# Patient Record
Sex: Female | Born: 1985 | Race: White | Hispanic: No | Marital: Married | Smoking: Never smoker
Health system: Southern US, Community
[De-identification: ages and names within clinical notes are randomized; demographics above are authoritative.]

## PROBLEM LIST (undated history)

## (undated) DIAGNOSIS — C801 Malignant (primary) neoplasm, unspecified: Secondary | ICD-10-CM

## (undated) DIAGNOSIS — R112 Nausea with vomiting, unspecified: Secondary | ICD-10-CM

## (undated) DIAGNOSIS — Z9889 Other specified postprocedural states: Secondary | ICD-10-CM

## (undated) HISTORY — PX: BREAST SURGERY: SHX581

## (undated) HISTORY — PX: SALPINGECTOMY: SHX328

## (undated) HISTORY — PX: BREAST RECONSTRUCTION WITH PLACEMENT OF TISSUE EXPANDER AND ALLODERM: SHX6805

---

## 2010-02-22 HISTORY — PX: LOBECTOMY: SHX5089

## 2012-05-30 ENCOUNTER — Other Ambulatory Visit: Payer: Self-pay | Admitting: *Deleted

## 2012-05-30 DIAGNOSIS — Z853 Personal history of malignant neoplasm of breast: Secondary | ICD-10-CM

## 2012-06-05 ENCOUNTER — Other Ambulatory Visit: Payer: Self-pay

## 2014-12-17 LAB — OB RESULTS CONSOLE ABO/RH: RH Type: POSITIVE

## 2014-12-17 LAB — OB RESULTS CONSOLE RPR: RPR: NONREACTIVE

## 2014-12-17 LAB — OB RESULTS CONSOLE ANTIBODY SCREEN: ANTIBODY SCREEN: NEGATIVE

## 2014-12-17 LAB — OB RESULTS CONSOLE HEPATITIS B SURFACE ANTIGEN: Hepatitis B Surface Ag: NEGATIVE

## 2014-12-17 LAB — OB RESULTS CONSOLE RUBELLA ANTIBODY, IGM: RUBELLA: IMMUNE

## 2014-12-17 LAB — OB RESULTS CONSOLE HIV ANTIBODY (ROUTINE TESTING): HIV: NONREACTIVE

## 2014-12-17 LAB — OB RESULTS CONSOLE GC/CHLAMYDIA
CHLAMYDIA, DNA PROBE: NEGATIVE
Gonorrhea: NEGATIVE

## 2015-05-31 ENCOUNTER — Inpatient Hospital Stay (HOSPITAL_COMMUNITY)
Admission: AD | Admit: 2015-05-31 | Discharge: 2015-06-04 | DRG: 765 | Disposition: A | Discharge status: Home / Self Care | Payer: 59 | Source: Ambulatory Visit | Attending: Obstetrics & Gynecology | Admitting: Obstetrics & Gynecology

## 2015-05-31 ENCOUNTER — Encounter (HOSPITAL_COMMUNITY): Payer: Self-pay | Admitting: *Deleted

## 2015-05-31 ENCOUNTER — Inpatient Hospital Stay (HOSPITAL_COMMUNITY): Payer: 59

## 2015-05-31 DIAGNOSIS — O321XX2 Maternal care for breech presentation, fetus 2: Secondary | ICD-10-CM | POA: Diagnosis present

## 2015-05-31 DIAGNOSIS — Z3A33 33 weeks gestation of pregnancy: Secondary | ICD-10-CM | POA: Diagnosis not present

## 2015-05-31 DIAGNOSIS — O42013 Preterm premature rupture of membranes, onset of labor within 24 hours of rupture, third trimester: Secondary | ICD-10-CM | POA: Diagnosis not present

## 2015-05-31 DIAGNOSIS — D649 Anemia, unspecified: Secondary | ICD-10-CM | POA: Diagnosis not present

## 2015-05-31 DIAGNOSIS — Z9079 Acquired absence of other genital organ(s): Secondary | ICD-10-CM | POA: Diagnosis not present

## 2015-05-31 DIAGNOSIS — O30049 Twin pregnancy, dichorionic/diamniotic, unspecified trimester: Secondary | ICD-10-CM | POA: Diagnosis present

## 2015-05-31 DIAGNOSIS — O42913 Preterm premature rupture of membranes, unspecified as to length of time between rupture and onset of labor, third trimester: Secondary | ICD-10-CM | POA: Diagnosis not present

## 2015-05-31 DIAGNOSIS — Z3689 Encounter for other specified antenatal screening: Secondary | ICD-10-CM

## 2015-05-31 DIAGNOSIS — Z853 Personal history of malignant neoplasm of breast: Secondary | ICD-10-CM | POA: Diagnosis not present

## 2015-05-31 DIAGNOSIS — O9902 Anemia complicating childbirth: Secondary | ICD-10-CM | POA: Diagnosis not present

## 2015-05-31 DIAGNOSIS — Z23 Encounter for immunization: Secondary | ICD-10-CM | POA: Diagnosis not present

## 2015-05-31 DIAGNOSIS — O43123 Velamentous insertion of umbilical cord, third trimester: Secondary | ICD-10-CM | POA: Diagnosis present

## 2015-05-31 DIAGNOSIS — O30009 Twin pregnancy, unspecified number of placenta and unspecified number of amniotic sacs, unspecified trimester: Secondary | ICD-10-CM | POA: Diagnosis present

## 2015-05-31 DIAGNOSIS — O30043 Twin pregnancy, dichorionic/diamniotic, third trimester: Secondary | ICD-10-CM | POA: Diagnosis present

## 2015-05-31 DIAGNOSIS — O321XX1 Maternal care for breech presentation, fetus 1: Secondary | ICD-10-CM | POA: Diagnosis present

## 2015-05-31 HISTORY — DX: Malignant (primary) neoplasm, unspecified: C80.1

## 2015-05-31 LAB — CBC
HEMATOCRIT: 32.4 % — AB (ref 36.0–46.0)
HEMOGLOBIN: 11 g/dL — AB (ref 12.0–15.0)
MCH: 30.6 pg (ref 26.0–34.0)
MCHC: 34 g/dL (ref 30.0–36.0)
MCV: 90.3 fL (ref 78.0–100.0)
Platelets: 171 10*3/uL (ref 150–400)
RBC: 3.59 MIL/uL — ABNORMAL LOW (ref 3.87–5.11)
RDW: 13.3 % (ref 11.5–15.5)
WBC: 8.5 10*3/uL (ref 4.0–10.5)

## 2015-05-31 LAB — WET PREP, GENITAL
CLUE CELLS WET PREP: NONE SEEN
Sperm: NONE SEEN
TRICH WET PREP: NONE SEEN
Yeast Wet Prep HPF POC: NONE SEEN

## 2015-05-31 MED ORDER — BETAMETHASONE SOD PHOS & ACET 6 (3-3) MG/ML IJ SUSP
12.0000 mg | Freq: Once | INTRAMUSCULAR | Status: AC
  Administered 2015-05-31: 12 mg via INTRAMUSCULAR
  Filled 2015-05-31: qty 2

## 2015-05-31 MED ORDER — NIFEDIPINE 10 MG PO CAPS
10.0000 mg | ORAL_CAPSULE | ORAL | Status: AC | PRN
Start: 2015-05-31 — End: 2015-06-01
  Administered 2015-05-31 – 2015-06-01 (×4): 10 mg via ORAL
  Filled 2015-05-31 (×4): qty 1

## 2015-05-31 NOTE — H&P (Signed)
Joy Reyes is a 30 y.o. female, G1P0 at 46 weeks with TIUP, presenting for pPROM at 2030 on 05/31/2015.  Patient reports clear fluid and denies ctx and vb.  Patient endorses fetal movement x2 since SROM.  Pregnancy was result of in vitro fertilization as patient has a personal history of breast cancer with salpingectomy secondary to positive BRCA testing.  Breech presentation noted on 4/4 [place].  Patient Active Problem List   Diagnosis Date Noted  . Preterm premature rupture of membranes in third trimester 06/01/2015  . Dichorionic diamniotic twin pregnancy 06/01/2015  . Personal history of breast cancer 06/01/2015  . H/O bilateral salpingectomy 06/01/2015    History of present pregnancy: Patient entered care at 9.3 weeks.   EDC of 07/19/2015 was established by Definite LMP of 10/12/2015 and confirmed by 8.0 wk [place] on 12/06/2014.   Anatomy scan:  21.3 weeks, with normal finding.   Additional [place] evaluations:   Anatomy: NORMAL ANATOMY US TODAY REVIEWED, NEEDS COMPLETION IN 2 WEEKS. TWIN FETAL POSITIONING DISCUSSED 23.3wks: [place]: A BREECH, NORMAL FLUID, GROWTH 57%. VELEMENTOUS CORD. B BREECH, NORMAL FLUID, GROWTH 54%. CX 4.46 CM. 32.3wks: DI/DI twin gestation FETUS A-EFW (1785g/3lbs 15oz) Lower maternal right. Complete breech presentation. Anterior placenta. Fluid is normal-vertical pocket=4.8cm. Spine not well visualized due to supine position. Previously seen velamentous cord insertion difficult to image on today's scan. Overall growth=17.1% BPD=14.6% AC=12.4% FETUS B-(2035g/4lbs 8oz) Maternal right upper breech presentation. Posterior placenta. Fluid is normal-8.6cm. Cervix closed. Adnexas unremarkable. DA/AA/Open hands/5th digit not well visualized due to advanced gestational age & position.   Significant prenatal events: 1st Trimester: No complaints.  Declined genetic screening 2nd Trimester: Influenza not recommended due to h/o compromised immune system. Pt c/o one episode of heart racing but  states she was outside in the heat for a prolonged period of time. Pt declines GTT, Due to medical reasons (Pt had Breast Cancer 2012 was told by Oncologist not to Consume Concentrated Sugars). 3rd Trimester: ABDOMINAL BINDER USE ADVISED FOR PELVIC PRESSURE. Pt reports some mild swelling of the feet/ankles    Last evaluation:  05/27/2015 in office by Dr. AVS.  FHR: 144/155 BP 114/78 Wt 184lbs TWG: 31  OB History    Gravida Para Term Preterm AB TAB SAB Ectopic Multiple Living   1              Past Medical History  Diagnosis Date  . Cancer [facility])    Past Surgical History  Procedure Laterality Date  . Breast surgery      lumpectomy on right breast  . Salpingectomy     Family History: family history is not on file. Social History:  reports that she has never smoked. She does not have any smokeless tobacco history on file. She reports that she drinks alcohol. She reports that she does not use illicit drugs.  Patient is married to Sharman Cheek who is involved and supportive.  Patient is of the [place] and has a 4 year college degree.  She is employed as a Warehouse manager.   Prenatal Transfer Tool  Maternal Diabetes: No Genetic Screening: Declined Maternal Ultrasounds/Referrals: Normal Fetal Ultrasounds or other Referrals:  None Maternal Substance Abuse:  No Significant Maternal Medications:  None Significant Maternal Lab Results: None  ROS:  Patient reports fetal movement, LOF, and denies ctx and VB.  Patient reports recent cold, but denies GI concerns Patient denies headache, visual disturbances,   Not on File   Dilation: 1 Effacement (%): 90 Station: -1 Exam  by:Marland Kitchen J. EMlyCNM Blood pressure 122/72, pulse 85, temperature 98.1 F (36.7 C), resp. rate 18, height _0  (1.727 m), weight 87.181 kg (192 lb 3.2 oz).  Physical Exam  Constitutional: She is oriented to person, place, and time. She appears well-developed and well-nourished. No distress.  HENT:  Head: Normocephalic  and atraumatic.  Eyes: Conjunctivae are normal.  Neck: Normal range of motion.  Cardiovascular: Normal rate, regular rhythm and normal heart sounds.   Respiratory: Effort normal and breath sounds normal.  GI: Soft.  Genitourinary: Vagina normal. Uterus is enlarged. No bleeding in the vagina.  Vaginal vault: Large amt of watery fluid noted.  Clear, Nonodorous  Musculoskeletal: Normal range of motion.  Neurological: She is alert and oriented to person, place, and time.  Skin: Skin is warm and dry.    Le[place]: EFW: TA: 3lbs 15oz TB: 4lbs 8oz by [place] on 4/4 Presentation: Breech/Breech  FHR: TA: 135 bpm, Mod Var, -Decels, +Accels TB: 145 bpm, Mod Var, -Decels, +Accels  UCs:  Q1-7mn, palpates moderate  Prenatal labs: ABO, Rh: A/Positive/-- (10/25 0000) Antibody: Negative (10/25 0000) Rubella:  Immune RPR: Nonreactive (10/25 0000)  HBsAg: Negative (10/25 0000)  HIV: Non-reactive (10/25 0000)  GBS:   Pending Sickle cell/Hgb electrophoresis:  Normal Pap:  2012 GC:  Negative Chlamydia:  Negative Other:  Hgb A1C: 5.4    Assessment TIUP at 33.0wks Cat I FT pPROM Undeterminable Fetal Presenting Part Velamentous Cord Insertion  Plan: Admit to L&D until stabilization confirmed per consult with Dr. AGillermo MurdochRoutine Antepartum Orders per [facility] Guideline Latency Antibiotics BMZ Course MFM Consult Collect GBS  [place] Fetal Presentation Procardia Protocol POC discussed with patient Q/C addressed Other mgmt as ordered  JLoann Quill MSN 05/31/2015, 11:12 PM   Addendum: [place]veals TIUP TA: Breech Maternal Right TB: Breech Maternal Left  GBS Negative  JMaryann ConnersMSN, CNM 06/01/2015 1:35 AM

## 2015-05-31 NOTE — MAU Note (Addendum)
Leaking clear fld since 2045. No pain. Twin boys - u/s this wk both breech

## 2015-06-01 ENCOUNTER — Encounter (HOSPITAL_COMMUNITY): Payer: Self-pay | Admitting: *Deleted

## 2015-06-01 ENCOUNTER — Inpatient Hospital Stay (HOSPITAL_COMMUNITY): Payer: 59 | Admitting: Anesthesiology

## 2015-06-01 ENCOUNTER — Encounter (HOSPITAL_COMMUNITY)
Admission: AD | Disposition: A | Discharge status: Home / Self Care | Payer: Self-pay | Source: Ambulatory Visit | Attending: Obstetrics & Gynecology

## 2015-06-01 DIAGNOSIS — Z9079 Acquired absence of other genital organ(s): Secondary | ICD-10-CM

## 2015-06-01 DIAGNOSIS — Z853 Personal history of malignant neoplasm of breast: Secondary | ICD-10-CM

## 2015-06-01 DIAGNOSIS — O30049 Twin pregnancy, dichorionic/diamniotic, unspecified trimester: Secondary | ICD-10-CM | POA: Diagnosis present

## 2015-06-01 DIAGNOSIS — O42913 Preterm premature rupture of membranes, unspecified as to length of time between rupture and onset of labor, third trimester: Secondary | ICD-10-CM | POA: Diagnosis present

## 2015-06-01 LAB — TYPE AND SCREEN
ABO/RH(D): A POS
ANTIBODY SCREEN: NEGATIVE

## 2015-06-01 LAB — HIV ANTIBODY (ROUTINE TESTING W REFLEX): HIV Screen 4th Generation wRfx: NONREACTIVE

## 2015-06-01 LAB — GROUP B STREP BY PCR: GROUP B STREP BY PCR: NEGATIVE

## 2015-06-01 LAB — RPR: RPR Ser Ql: NONREACTIVE

## 2015-06-01 LAB — ABO/RH: ABO/RH(D): A POS

## 2015-06-01 SURGERY — Surgical Case
Anesthesia: Regional

## 2015-06-01 MED ORDER — SCOPOLAMINE 1 MG/3DAYS TD PT72
MEDICATED_PATCH | TRANSDERMAL | Status: AC
  Administered 2015-06-01: 1.5 mg
  Filled 2015-06-01: qty 1

## 2015-06-01 MED ORDER — SODIUM CHLORIDE 0.9% FLUSH: 3.0000 mL | INTRAVENOUS | Status: DC | PRN

## 2015-06-01 MED ORDER — FENTANYL CITRATE (PF) 100 MCG/2ML IJ SOLN
INTRAMUSCULAR | Status: AC
  Filled 2015-06-01: qty 2

## 2015-06-01 MED ORDER — CEFAZOLIN SODIUM-DEXTROSE 2-4 GM/100ML-% IV SOLN: 2.0000 g | Freq: Once | INTRAVENOUS | Status: DC

## 2015-06-01 MED ORDER — MAGNESIUM SULFATE BOLUS VIA INFUSION
4.0000 g | Freq: Once | INTRAVENOUS | Status: AC
  Administered 2015-06-01: 4 g via INTRAVENOUS
  Filled 2015-06-01: qty 500

## 2015-06-01 MED ORDER — HYDROMORPHONE HCL 1 MG/ML IJ SOLN: 0.2500 mg | INTRAMUSCULAR | Status: DC | PRN

## 2015-06-01 MED ORDER — NALOXONE HCL 0.4 MG/ML IJ SOLN: 0.4000 mg | INTRAMUSCULAR | Status: DC | PRN

## 2015-06-01 MED ORDER — DIBUCAINE 1 % RE OINT: 1.0000 "application " | TOPICAL_OINTMENT | RECTAL | Status: DC | PRN

## 2015-06-01 MED ORDER — LACTATED RINGERS IV SOLN
INTRAVENOUS | Status: DC | PRN
  Administered 2015-06-01 (×2): via INTRAVENOUS

## 2015-06-01 MED ORDER — ZOLPIDEM TARTRATE 5 MG PO TABS: 5.0000 mg | ORAL_TABLET | Freq: Every evening | ORAL | Status: DC | PRN

## 2015-06-01 MED ORDER — PRENATAL MULTIVITAMIN CH: 1.0000 | ORAL_TABLET | Freq: Every day | ORAL | Status: DC

## 2015-06-01 MED ORDER — DIPHENHYDRAMINE HCL 50 MG/ML IJ SOLN: 12.5000 mg | INTRAMUSCULAR | Status: DC | PRN

## 2015-06-01 MED ORDER — OXYTOCIN 10 UNIT/ML IJ SOLN: 2.5000 [IU]/h | INTRAVENOUS | Status: AC

## 2015-06-01 MED ORDER — DIPHENHYDRAMINE HCL 25 MG PO CAPS: 25.0000 mg | ORAL_CAPSULE | ORAL | Status: DC | PRN

## 2015-06-01 MED ORDER — NALBUPHINE HCL 10 MG/ML IJ SOLN: 5.0000 mg | INTRAMUSCULAR | Status: DC | PRN

## 2015-06-01 MED ORDER — ONDANSETRON HCL 4 MG/2ML IJ SOLN
INTRAMUSCULAR | Status: AC
  Filled 2015-06-01: qty 2

## 2015-06-01 MED ORDER — CITRIC ACID-SODIUM CITRATE 334-500 MG/5ML PO SOLN
ORAL | Status: AC
  Administered 2015-06-01: 30 mL
  Filled 2015-06-01: qty 15

## 2015-06-01 MED ORDER — MEPERIDINE HCL 25 MG/ML IJ SOLN: 6.2500 mg | INTRAMUSCULAR | Status: DC | PRN

## 2015-06-01 MED ORDER — MORPHINE SULFATE (PF) 0.5 MG/ML IJ SOLN
INTRAMUSCULAR | Status: DC | PRN
  Administered 2015-06-01: .2 mg via EPIDURAL

## 2015-06-01 MED ORDER — CALCIUM CARBONATE ANTACID 500 MG PO CHEW: 2.0000 | CHEWABLE_TABLET | ORAL | Status: DC | PRN

## 2015-06-01 MED ORDER — OXYTOCIN 10 UNIT/ML IJ SOLN
40.0000 [IU] | INTRAVENOUS | Status: DC | PRN
  Administered 2015-06-01: 40 [IU] via INTRAVENOUS

## 2015-06-01 MED ORDER — IBUPROFEN 600 MG PO TABS
600.0000 mg | ORAL_TABLET | Freq: Four times a day (QID) | ORAL | Status: DC
  Administered 2015-06-02 – 2015-06-04 (×9): 600 mg via ORAL
  Filled 2015-06-01 (×11): qty 1

## 2015-06-01 MED ORDER — KETOROLAC TROMETHAMINE 30 MG/ML IJ SOLN
30.0000 mg | Freq: Four times a day (QID) | INTRAMUSCULAR | Status: AC | PRN
  Administered 2015-06-01: 30 mg via INTRAMUSCULAR

## 2015-06-01 MED ORDER — LACTATED RINGERS IV SOLN
INTRAVENOUS | Status: DC | PRN
  Administered 2015-06-01: 10:00:00 via INTRAVENOUS

## 2015-06-01 MED ORDER — OXYCODONE-ACETAMINOPHEN 5-325 MG PO TABS: 2.0000 | ORAL_TABLET | ORAL | Status: DC | PRN

## 2015-06-01 MED ORDER — ONDANSETRON HCL 4 MG/2ML IJ SOLN
INTRAMUSCULAR | Status: AC
  Administered 2015-06-01: 4 mg
  Filled 2015-06-01: qty 2

## 2015-06-01 MED ORDER — BUPIVACAINE HCL (PF) 0.5 % IJ SOLN
INTRAMUSCULAR | Status: AC
  Filled 2015-06-01: qty 30

## 2015-06-01 MED ORDER — BUPIVACAINE HCL (PF) 0.5 % IJ SOLN
INTRAMUSCULAR | Status: DC | PRN
  Administered 2015-06-01: 3 mL via INTRATHECAL

## 2015-06-01 MED ORDER — OXYTOCIN 10 UNIT/ML IJ SOLN
INTRAMUSCULAR | Status: AC
  Filled 2015-06-01: qty 4

## 2015-06-01 MED ORDER — AZITHROMYCIN 500 MG PO TABS
1000.0000 mg | ORAL_TABLET | Freq: Once | ORAL | Status: AC
  Administered 2015-06-01: 1000 mg via ORAL
  Filled 2015-06-01: qty 2

## 2015-06-01 MED ORDER — PROMETHAZINE HCL 25 MG/ML IJ SOLN
INTRAMUSCULAR | Status: AC
  Filled 2015-06-01: qty 1

## 2015-06-01 MED ORDER — MENTHOL 3 MG MT LOZG: 1.0000 | LOZENGE | OROMUCOSAL | Status: DC | PRN

## 2015-06-01 MED ORDER — LACTATED RINGERS IV SOLN
INTRAVENOUS | Status: DC
  Administered 2015-06-01: 01:00:00 via INTRAVENOUS

## 2015-06-01 MED ORDER — NALBUPHINE HCL 10 MG/ML IJ SOLN: 5.0000 mg | Freq: Once | INTRAMUSCULAR | Status: DC | PRN

## 2015-06-01 MED ORDER — SIMETHICONE 80 MG PO CHEW: 80.0000 mg | CHEWABLE_TABLET | ORAL | Status: DC | PRN

## 2015-06-01 MED ORDER — DOCUSATE SODIUM 100 MG PO CAPS
100.0000 mg | ORAL_CAPSULE | Freq: Every day | ORAL | Status: DC
  Administered 2015-06-02: 100 mg via ORAL
  Filled 2015-06-01: qty 1

## 2015-06-01 MED ORDER — ONDANSETRON HCL 4 MG/2ML IJ SOLN
INTRAMUSCULAR | Status: AC
Start: 2015-06-01 — End: 2015-06-01
  Filled 2015-06-01: qty 2

## 2015-06-01 MED ORDER — TETANUS-DIPHTH-ACELL PERTUSSIS 5-2.5-18.5 LF-MCG/0.5 IM SUSP: 0.5000 mL | Freq: Once | INTRAMUSCULAR | Status: DC

## 2015-06-01 MED ORDER — PHENYLEPHRINE 8 MG IN D5W 100 ML (0.08MG/ML) PREMIX OPTIME
INJECTION | INTRAVENOUS | Status: AC
  Filled 2015-06-01: qty 100

## 2015-06-01 MED ORDER — MAGNESIUM SULFATE 50 % IJ SOLN
2.0000 g/h | INTRAMUSCULAR | Status: DC
  Filled 2015-06-01: qty 80

## 2015-06-01 MED ORDER — KETOROLAC TROMETHAMINE 30 MG/ML IJ SOLN: 30.0000 mg | Freq: Four times a day (QID) | INTRAMUSCULAR | Status: AC | PRN

## 2015-06-01 MED ORDER — SCOPOLAMINE 1 MG/3DAYS TD PT72
1.0000 | MEDICATED_PATCH | TRANSDERMAL | Status: DC
  Filled 2015-06-01: qty 1

## 2015-06-01 MED ORDER — SODIUM CHLORIDE 0.9 % IV SOLN
2.0000 g | Freq: Four times a day (QID) | INTRAVENOUS | Status: DC
  Administered 2015-06-01 (×2): 2 g via INTRAVENOUS
  Filled 2015-06-01 (×3): qty 2000

## 2015-06-01 MED ORDER — ONDANSETRON HCL 4 MG/2ML IJ SOLN
4.0000 mg | Freq: Three times a day (TID) | INTRAMUSCULAR | Status: DC | PRN
  Administered 2015-06-01: 4 mg via INTRAVENOUS

## 2015-06-01 MED ORDER — ACETAMINOPHEN 325 MG PO TABS: 650.0000 mg | ORAL_TABLET | ORAL | Status: DC | PRN

## 2015-06-01 MED ORDER — KETOROLAC TROMETHAMINE 30 MG/ML IJ SOLN
INTRAMUSCULAR | Status: AC
  Filled 2015-06-01: qty 1

## 2015-06-01 MED ORDER — AMOXICILLIN 500 MG PO CAPS: 500.0000 mg | ORAL_CAPSULE | Freq: Three times a day (TID) | ORAL | Status: DC

## 2015-06-01 MED ORDER — WITCH HAZEL-GLYCERIN EX PADS: 1.0000 "application " | MEDICATED_PAD | CUTANEOUS | Status: DC | PRN

## 2015-06-01 MED ORDER — SCOPOLAMINE 1 MG/3DAYS TD PT72: 1.0000 | MEDICATED_PATCH | Freq: Once | TRANSDERMAL | Status: DC

## 2015-06-01 MED ORDER — PROMETHAZINE HCL 25 MG/ML IJ SOLN
6.2500 mg | Freq: Once | INTRAMUSCULAR | Status: DC
Start: 2015-06-01 — End: 2015-06-04

## 2015-06-01 MED ORDER — MORPHINE SULFATE (PF) 0.5 MG/ML IJ SOLN
INTRAMUSCULAR | Status: AC
  Filled 2015-06-01: qty 10

## 2015-06-01 MED ORDER — OXYCODONE-ACETAMINOPHEN 5-325 MG PO TABS: 1.0000 | ORAL_TABLET | ORAL | Status: DC | PRN

## 2015-06-01 MED ORDER — SIMETHICONE 80 MG PO CHEW
80.0000 mg | CHEWABLE_TABLET | Freq: Three times a day (TID) | ORAL | Status: DC
  Administered 2015-06-02 – 2015-06-03 (×4): 80 mg via ORAL
  Filled 2015-06-01 (×6): qty 1

## 2015-06-01 MED ORDER — LANOLIN HYDROUS EX OINT: 1.0000 "application " | TOPICAL_OINTMENT | CUTANEOUS | Status: DC | PRN

## 2015-06-01 MED ORDER — LACTATED RINGERS IV SOLN
INTRAVENOUS | Status: DC
  Administered 2015-06-01 – 2015-06-02 (×2): via INTRAVENOUS

## 2015-06-01 MED ORDER — DEXTROSE 5 % IV SOLN
1.0000 ug/kg/h | INTRAVENOUS | Status: DC | PRN
  Filled 2015-06-01: qty 2

## 2015-06-01 MED ORDER — FENTANYL CITRATE (PF) 100 MCG/2ML IJ SOLN: 50.0000 ug | INTRAMUSCULAR | Status: DC | PRN

## 2015-06-01 MED ORDER — DIPHENHYDRAMINE HCL 25 MG PO CAPS: 25.0000 mg | ORAL_CAPSULE | Freq: Four times a day (QID) | ORAL | Status: DC | PRN

## 2015-06-01 MED ORDER — PRENATAL MULTIVITAMIN CH
1.0000 | ORAL_TABLET | Freq: Every day | ORAL | Status: DC
  Administered 2015-06-03 – 2015-06-04 (×2): 1 via ORAL
  Filled 2015-06-01 (×3): qty 1

## 2015-06-01 MED ORDER — PHENYLEPHRINE 8 MG IN D5W 100 ML (0.08MG/ML) PREMIX OPTIME
INJECTION | INTRAVENOUS | Status: DC | PRN
  Administered 2015-06-01: 60 ug/min via INTRAVENOUS

## 2015-06-01 MED ORDER — SIMETHICONE 80 MG PO CHEW
80.0000 mg | CHEWABLE_TABLET | ORAL | Status: DC
  Administered 2015-06-03: 80 mg via ORAL
  Filled 2015-06-01 (×3): qty 1

## 2015-06-01 MED ORDER — CEFAZOLIN SODIUM-DEXTROSE 2-3 GM-% IV SOLR
INTRAVENOUS | Status: DC | PRN
  Administered 2015-06-01: 2 g via INTRAVENOUS

## 2015-06-01 SURGICAL SUPPLY — 33 items
CHLORAPREP W/TINT 26ML (MISCELLANEOUS) ×2 IMPLANT
CLAMP CORD UMBIL (MISCELLANEOUS) ×2 IMPLANT
CLOTH BEACON ORANGE TIMEOUT ST (SAFETY) ×2 IMPLANT
DRAPE C SECTION CLR SCREEN (DRAPES) IMPLANT
DRSG OPSITE POSTOP 4X10 (GAUZE/BANDAGES/DRESSINGS) ×2 IMPLANT
ELECT REM PT RETURN 9FT ADLT (ELECTROSURGICAL) ×2
ELECTRODE REM PT RTRN 9FT ADLT (ELECTROSURGICAL) ×1 IMPLANT
EXTRACTOR VACUUM M CUP 4 TUBE (SUCTIONS) IMPLANT
GLOVE BIOGEL PI IND STRL 7.0 (GLOVE) ×3 IMPLANT
GLOVE BIOGEL PI INDICATOR 7.0 (GLOVE) ×3
GLOVE SURG SS PI 6.5 STRL IVOR (GLOVE) ×2 IMPLANT
GOWN STRL REUS W/TWL LRG LVL3 (GOWN DISPOSABLE) ×4 IMPLANT
KIT ABG SYR 3ML LUER SLIP (SYRINGE) IMPLANT
LIQUID BAND (GAUZE/BANDAGES/DRESSINGS) ×2 IMPLANT
NEEDLE HYPO 25X5/8 SAFETYGLIDE (NEEDLE) IMPLANT
NS IRRIG 1000ML POUR BTL (IV SOLUTION) ×2 IMPLANT
PACK C SECTION WH (CUSTOM PROCEDURE TRAY) ×2 IMPLANT
PAD OB MATERNITY 4.3X12.25 (PERSONAL CARE ITEMS) ×2 IMPLANT
PENCIL SMOKE EVAC W/HOLSTER (ELECTROSURGICAL) ×2 IMPLANT
RTRCTR C-SECT PINK 25CM LRG (MISCELLANEOUS) ×2 IMPLANT
SUT CHROMIC 1 CTX 36 (SUTURE) IMPLANT
SUT CHROMIC 2 0 CT 1 (SUTURE) ×2 IMPLANT
SUT MON AB 4-0 PS1 27 (SUTURE) ×2 IMPLANT
SUT PLAIN 1 NONE 54 (SUTURE) IMPLANT
SUT PLAIN 2 0 (SUTURE) ×1
SUT PLAIN 2 0 XLH (SUTURE) IMPLANT
SUT PLAIN ABS 2-0 CT1 27XMFL (SUTURE) ×1 IMPLANT
SUT VIC AB 0 CTX 36 (SUTURE) ×1
SUT VIC AB 0 CTX36XBRD ANBCTRL (SUTURE) ×1 IMPLANT
SUT VIC AB 1 CTX 36 (SUTURE) ×2
SUT VIC AB 1 CTX36XBRD ANBCTRL (SUTURE) ×2 IMPLANT
TOWEL OR 17X24 6PK STRL BLUE (TOWEL DISPOSABLE) ×2 IMPLANT
TRAY FOLEY CATH SILVER 14FR (SET/KITS/TRAYS/PACK) ×2 IMPLANT

## 2015-06-01 NOTE — Progress Notes (Signed)
This note also relates to the following rows which could not be included: BP - Cannot attach notes to unvalidated device data Pulse Rate - Cannot attach notes to unvalidated device data Resp - Cannot attach notes to unvalidated device data SpO2 - Cannot attach notes to unvalidated device data   No N/V at this time.

## 2015-06-01 NOTE — Brief Op Note (Signed)
05/31/2015 - 06/01/2015  1:34 PM  PATIENT:  Joy Reyes  30 y.o. female  PRE-OPERATIVE DIAGNOSIS:   1.  33 week 1 day EGA Di-di Twin intrauterine pregnancy by IVF. 2.  Preterm rupture of membranes.  3.  Preterm labor.   4.  Breech/ breech presentation of twins.    POST-OPERATIVE DIAGNOSIS:  Same as above.    PROCEDURE:  Procedure(s): CESAREAN SECTION (N/A)- Primary Lower uterine segment Cesarean section.   SURGEON:  Surgeon(s) and Role:    * Waymon Amato, MD - Primary  ASSISTANTS: Venus Standard, CNM.     ANESTHESIA:   spinal  EBL:  Total I/O In: 3350 [I.V.:3350] Out: 2100 [Urine:850; Emesis/NG output:250; Blood:1000]  BLOOD ADMINISTERED:none  DRAINS: none   LOCAL MEDICATIONS USED:  NONE  SPECIMEN:  Source of Specimen:  Placenta X 2, Cord blood X 2.   DISPOSITION OF SPECIMEN:  PATHOLOGY  COUNTS:  YES  TOURNIQUET:  * No tourniquets in log *  DICTATION: .Dragon Dictation  PLAN OF CARE: Admit to inpatient   PATIENT DISPOSITION:  PACU - hemodynamically stable.   Delay start of Pharmacological VTE agent (>24hrs) due to surgical blood loss or risk of bleeding: not applicable

## 2015-06-01 NOTE — Op Note (Signed)
Patient:  Joy Reyes, Joy Reyes DOB: 22-Sep-1985 MRN:  [id number]  DATE OF SURGERY: 06/01/2015  PREOP DIAGNOSIS:  1. 33week 1 day  EGA Di-di Twin intrauterine pregnancy via IVF. 2. Premature rupture of membranes.   3.  Preterm labor.  4.  Breech/breech presentation of twins.      POSTOP DIAGNOSIS: Same as above.  PROCEDURE: Primary low uterine segment transverse cesarean section via Pfannenstiel incision.     SURGEON: Dr.  Waymon Amato.  ASSISTANT: Venus Standard, CNM  ANESTHESIA: Spinal  COMPLICATIONS: None  FINDINGS: Twin A: Viable female infant in frank breech presentation, DSA, weight 4 pounds 5.1 ounces, Apgar scores pending.   Twin B: Viable female infant in frank breech presentation, DSA, weight 4lb 140z, Apgar scores pending.  Normal uterus and ovaries bilaterally.  Both fallopian tubes surgically missing.  Both placentas appeared grossly normal and intact.  Twin A umbilical cord with true knot.    EBL:  900 cc  IV FLUID:  3350cc LR   URINE OUTPUT: 850 cc clear urine  EMESIS: 250 cc  INDICATIONS: 30 y/o P0 who presented with preterm rupture of membranes. She was found to be 1 cm dilated upon presentation and with contractions. She received procardia and then magnesium sulfate for tocolysis but continued to contract and make cervical change to 4cm dilation.  Fetuses were breech breech presentation, therefore it was decided to proceed with cesarean delivery.    PROCEDURE:   Informed consent was obtained from the patient to undergo the procedure. She was taken to the operating room where her spinal anesthesia was found to be adequate. She was prepped and draped in the usual sterile fashion and a Foley catheter was placed. She received 2 g of IV Ancef preoperatively. A Pfannenstiel incision was made with the scalpel and the incision extended through the subcutaneous layer and also the fascia with the bovie. Small perforators in the subcutaneous layer were contained with the Bovie. The  fascia was nicked in the midline and then was further separated from the rectus muscles bilaterally using Mayo scissors. Kochers were placed inferiorly and then superiorly to allow further separation of fascia from the rectus muscles.  Small bleeders running between fascia and muscles were contained with the bovie.  The peritoneal cavity was entered bluntly with the fingers. The Alexis retractor was placed in. The bladder flap was created using Metzenbaum scissors.   The uterus was incised with a scalpel and the incision extended bluntly bilaterally with fingers. Clear amniotic fluid was noted.  Twin A was delivered from frank breech presentation in usual fashion and atraumatically.  Delayed cord clamping was performed for both twins.  True knot was noted on Twin A's umbilical cord.  Twin  B was similarly delivered from the frank breech presentation in usual fashion and atraumatically.  Twin B had a loose nuchal cord that was easily reduced before delivery.   Cord blood was collected.    The uterus was not exteriorized.  The placentas were delivered with gentle traction on the umbilical cords and gentle cleaving of placenta A. The edges of the uterus was grasped with Allis clamps and also T. Clamps. The uterus was cleared of clots and debris with a lap.  The uterine incision was closed with #1 Vicryl in a running locked stitch. An imbricating layer of came stitch was placed over the initial closure.  Irrigation was applied and suctioned out. Excellent hemostasis was noted over the incision.  The muscles were then reapproximated using chromic suture interrupted  stitches.  Fascia was closed using 0 Vicryl in a running stitch. The subcutaneous layer was irrigated and suctioned out.  The subcutaneous was closed over using 1-0 plain in interrupted stitches. The skin was closed using 4-0 Monocryl. Dermabond was applied. Honeycomb was then applied. The patient was then cleaned and she was taken to the recovery room  in stable condition. The neonates were taken to the NICU in stable condition.   SPECIMEN: Placenta X 2, umbilical cord blood X 2.   DISPOSITION: TO PACU, STABLE.

## 2015-06-01 NOTE — Addendum Note (Signed)
Addendum  created 06/01/15 1500 by Garner Nash, CRNA   Modules edited: Anesthesia Events, Narrator   Narrator:  Narrator: Event Log Edited

## 2015-06-01 NOTE — Anesthesia Postprocedure Evaluation (Signed)
Anesthesia Post Note  Patient: Joy Reyes  Procedure(s) Performed: Procedure(s) (LRB): CESAREAN SECTION (N/A)  Patient location during evaluation: PACU Anesthesia Type: Spinal Level of consciousness: awake and alert Pain management: pain level controlled Vital Signs Assessment: post-procedure vital signs reviewed and stable Respiratory status: spontaneous breathing and respiratory function stable Cardiovascular status: blood pressure returned to baseline and stable Postop Assessment: spinal receding Anesthetic complications: no    Last Vitals:  Filed Vitals:   06/01/15 1130 06/01/15 1146  BP: 105/63 102/65  Pulse: 58 59  Temp: 35.6 C 36.3 C  Resp: 15 17    Last Pain:  Filed Vitals:   06/01/15 1149  PainSc: 0-No pain                 Nolon Nations

## 2015-06-01 NOTE — Plan of Care (Signed)
Problem: Coping: Goal: Ability to cope will improve Outcome: Completed/Met Date Met:  06/01/15 Displays appropriate affect when talking about the birth of her twin sons.  Problem: Role Relationship: Goal: Ability to demonstrate positive interaction with newborn will improve Outcome: Completed/Met Date Met:  06/01/15 Has visited the twins in NICU and is very excited about them being here.

## 2015-06-01 NOTE — Progress Notes (Signed)
This note also relates to the following rows which could not be included: Pulse Rate - Cannot attach notes to unvalidated device data Resp - Cannot attach notes to unvalidated device data SpO2 - Cannot attach notes to unvalidated device data   Consulted MD [place], Orders 6.25 Phenergan IVP

## 2015-06-01 NOTE — Anesthesia Preprocedure Evaluation (Signed)
Anesthesia Evaluation  Patient identified by MRN, date of birth, ID band Patient awake    Reviewed: Allergy & Precautions, NPO status , Patient's Chart, lab work & pertinent test results  Airway Mallampati: II  TM Distance: >3 FB Neck ROM: Full    Dental no notable dental hx.    Pulmonary neg pulmonary ROS,    Pulmonary exam normal breath sounds clear to auscultation       Cardiovascular negative cardio ROS Normal cardiovascular exam Rhythm:Regular Rate:Normal     Neuro/Psych negative neurological ROS  negative psych ROS   GI/Hepatic negative GI ROS, Neg liver ROS,   Endo/Other  negative endocrine ROS  Renal/GU negative Renal ROS     Musculoskeletal negative musculoskeletal ROS (+)   Abdominal   Peds  Hematology negative hematology ROS (+)   Anesthesia Other Findings   Reproductive/Obstetrics (+) Pregnancy                             Anesthesia Physical Anesthesia Plan  ASA: II  Anesthesia Plan: Spinal   Post-op Pain Management:    Induction: Intravenous  Airway Management Planned:   Additional Equipment:   Intra-op Plan:   Post-operative Plan:   Informed Consent: I have reviewed the patients History and Physical, chart, labs and discussed the procedure including the risks, benefits and alternatives for the proposed anesthesia with the patient or authorized representative who has indicated his/her understanding and acceptance.   Dental advisory given  Plan Discussed with: CRNA  Anesthesia Plan Comments:         Anesthesia Quick Evaluation

## 2015-06-01 NOTE — Progress Notes (Signed)
Joy Reyes is a 30 y.o. G1P0 at [redacted]w[redacted]d   Subjective: Still leaking, ctx are less intense and less frequent  Objective: BP 108/56 mmHg  Pulse 82  Temp(Src) 98 F (36.7 C) (Oral)  Resp 18  Ht 5\' 8"  (1.727 m)  Wt 192 lb (87.091 kg)  BMI 29.20 kg/m2      FHT:  Cat 1 x 2 UC:   q 5-20min SVE:   Dilation: 3 Effacement (%): 90 Station: 0 Exam by:: Octavio Manns, MD  Labs: Lab Results  Component Value Date   WBC 8.5 05/31/2015   HGB 11.0* 05/31/2015   HCT 32.4* 05/31/2015   MCV 90.3 05/31/2015   PLT 171 05/31/2015    Assessment / Plan: P0 at 75 1/7wks with SROM, GBS neg undergoing tocolysis.  Pt however appears to be breaking through.  CNM called me about 5:25am and reported increase in ctxs and exam now 3cm from 1cm.  I instructed CNM ([name]) to start Mg.  Upon my arrival, Mg had just been started so I wanted to see if there would be some decrease in contractions.  At 6:30 pt definitely reported decrease in intensity and frequency.  At 7am, exam was unchanged but contractions possibly opicking back up in intensity.  Labor: Tocolysis on Mg s/p SROM and 1 dose of BMZ Preeclampsia:  n/a Fetal Wellbeing:  Category I Pain Control:  none currently I/D:  GBS neg Anticipated MOD:  C-Section (I have signed out to Dr. Alesia Richards and she is coming to do c-section)  Lissett Favorite Y 06/01/2015, 7:26 AM

## 2015-06-01 NOTE — Progress Notes (Signed)
6.25 phenergan given IVP diluted with 10 cc NS

## 2015-06-01 NOTE — Progress Notes (Signed)
This note also relates to the following rows which could not be included: BP - Cannot attach notes to unvalidated device data Pulse Rate - Cannot attach notes to unvalidated device data Resp - Cannot attach notes to unvalidated device data SpO2 - Cannot attach notes to unvalidated device data   Pain with vomiting

## 2015-06-01 NOTE — Progress Notes (Signed)
Pt back from bathroom at 0255, RN at bedside adjusting monitors until 0328 trying to trace both babies; baby A difficult to trace; very difficult to trace both babies

## 2015-06-01 NOTE — Interval H&P Note (Signed)
History and Physical Interval Note:  06/01/2015 9:23 AM  Joy Reyes  has presented today for surgery, with the diagnosis of Premature rupture of membranes, preterm labor (most recent exam 4-5 cm/90%/-1) at 33 weeks 3 days EGA with twins in breech, breech presentation.  The various methods of treatment have been discussed with the patient and family. After consideration of risks, benefits and other options for treatment, the patient has consented to  Procedure(s): CESAREAN SECTION (N/A) as a surgical intervention .  The patient's history has been reviewed, patient examined, no change in status, stable for surgery.  I have reviewed the patient's chart and labs.  Questions were answered to the patient's satisfaction.    [facility]

## 2015-06-01 NOTE — Transfer of Care (Signed)
Immediate Anesthesia Transfer of Care Note  Patient: Joy Reyes  Procedure(s) Performed: Procedure(s): CESAREAN SECTION (N/A)  Patient Location: PACU  Anesthesia Type:Spinal  Level of Consciousness: awake and alert   Airway & Oxygen Therapy: Patient Spontanous Breathing  Post-op Assessment: Report given to RN and Post -op Vital signs reviewed and stable  Post vital signs: Reviewed and stable  Last Vitals:  Filed Vitals:   06/01/15 0530 06/01/15 0802  BP:  125/62  Pulse:  91  Temp: 36.7 C   Resp:  18    Complications: No apparent anesthesia complications

## 2015-06-01 NOTE — Anesthesia Procedure Notes (Signed)
Spinal Patient location during procedure: OR Staffing Performed by: anesthesiologist  Preanesthetic Checklist Completed: patient identified, site marked, surgical consent, pre-op evaluation, timeout performed, IV checked, risks and benefits discussed and monitors and equipment checked Spinal Block Patient position: sitting Prep: Betadine Patient monitoring: heart rate, continuous pulse ox and blood pressure Location: L2-3 Injection technique: single-shot Needle Needle type: Sprotte  Needle gauge: 24 G Needle length: 9 cm Additional Notes Expiration date of kit checked and confirmed. Patient tolerated procedure well, without complications.

## 2015-06-01 NOTE — Progress Notes (Signed)
Keirra Reyes MRN: [id number]  Subjective: -Nurse reports 4th dose Procardia given.  In room to assess.  Patient reports intermittent cramping that lasts about 40 seconds.  Nurse reports some bloody show was reported as well.    Objective: BP 108/56 mmHg  Pulse 82  Temp(Src) 98.3 F (36.8 C) (Oral)  Resp 18  Ht 5\' 8"  (1.727 m)  Wt 87.091 kg (192 lb)  BMI 29.20 kg/m2      Fetal Monitoring: FHT: 145 bpm, Mod Var, -Decels, +Accels FHTB: 130 bpm, Mod Var, -Decels, +Accels UC: palpates mild to moderate    Vaginal Exam: SVE:   Dilation: 3 Effacement (%): 90 Station: -1 Exam by:: JoyMouna Reyes, CNM  Membranes:SROM of Twin A Internal Monitors: None  Augmentation/Induction: Pitocin:None Cytotec: None  Assessment:  TIUP at 33.1wks Cat I FT  pPROM GBS Negative Cervical Change  Plan: -Dr. Gillermo Murdoch consulted and advised *Start MgSO4 infusion *Continue to monitor and recheck if necessary -Will order IV fentanyl for pain if desired -Continue other mgmt as ordered  Joy Reyes, CNM 06/01/2015, 5:30 AM

## 2015-06-02 DIAGNOSIS — O30009 Twin pregnancy, unspecified number of placenta and unspecified number of amniotic sacs, unspecified trimester: Secondary | ICD-10-CM | POA: Diagnosis present

## 2015-06-02 LAB — CBC
HCT: 27.7 % — ABNORMAL LOW (ref 36.0–46.0)
Hemoglobin: 9.3 g/dL — ABNORMAL LOW (ref 12.0–15.0)
MCH: 30.4 pg (ref 26.0–34.0)
MCHC: 33.6 g/dL (ref 30.0–36.0)
MCV: 90.5 fL (ref 78.0–100.0)
Platelets: 141 10*3/uL — ABNORMAL LOW (ref 150–400)
RBC: 3.06 MIL/uL — ABNORMAL LOW (ref 3.87–5.11)
RDW: 13.4 % (ref 11.5–15.5)
WBC: 8.8 10*3/uL (ref 4.0–10.5)

## 2015-06-02 LAB — GC/CHLAMYDIA PROBE AMP (~~LOC~~) NOT AT ARMC
Chlamydia: NEGATIVE
Neisseria Gonorrhea: NEGATIVE

## 2015-06-02 MED ORDER — FERROUS SULFATE 325 (65 FE) MG PO TABS
325.0000 mg | ORAL_TABLET | Freq: Two times a day (BID) | ORAL | Status: DC
  Administered 2015-06-02 – 2015-06-04 (×5): 325 mg via ORAL
  Filled 2015-06-02 (×5): qty 1

## 2015-06-02 NOTE — Anesthesia Postprocedure Evaluation (Signed)
Anesthesia Post Note  Patient: Joy Reyes  Procedure(s) Performed: Procedure(s) (LRB): CESAREAN SECTION (N/A)  Patient location during evaluation: Mother Baby Anesthesia Type: Spinal Level of consciousness: awake and alert and oriented Pain management: satisfactory to patient Vital Signs Assessment: post-procedure vital signs reviewed and stable Respiratory status: spontaneous breathing and nonlabored ventilation Cardiovascular status: stable Postop Assessment: no headache, no backache, patient able to bend at knees, no signs of nausea or vomiting and adequate PO intake Anesthetic complications: no    Last Vitals:  Filed Vitals:   06/02/15 0218 06/02/15 0521  BP: 102/56 108/72  Pulse: 66 83  Temp: 36.8 C 36.6 C  Resp: 14 16    Last Pain:  Filed Vitals:   06/02/15 0627  PainSc: 0-No pain                 Laneice Meneely

## 2015-06-02 NOTE — Progress Notes (Signed)
Subjective: Postpartum Day 1: Cesarean Delivery due to preterm twins breach/breach Patient up ad lib, reports no syncope or dizziness. Feeding:  Breast pumping Contraceptive plan:  unsure  Objective: Vital signs in last 24 hours: Temp:  [96 F (35.6 C)-98.4 F (36.9 C)] 97.9 F (36.6 C) (04/10 0521) Pulse Rate:  [48-83] 83 (04/10 0521) Resp:  [11-23] 16 (04/10 0521) BP: (97-123)/(50-96) 108/72 mmHg (04/10 0521) SpO2:  [98 %-100 %] 99 % (04/10 0521)  Physical Exam:  General: alert and cooperative Lochia: appropriate Uterine Fundus: firm Abdomen:  + bowel sounds, non distended Incision: healing well  Honeycomb dressing CDI DVT Evaluation: No evidence of DVT seen on physical exam. Homan's sign: Negative   Recent Labs  05/31/15 2330 06/02/15 0510  HGB 11.0* 9.3*  HCT 32.4* 27.7*  WBC 8.5 8.8   Orthostatics stable.  Assessment: Status post Cesarean section day 1. Doing well postoperatively.  Honeycomb dressing in place, no significant drainage Anemia - hemodynamicly stable.  Circumcision: out patient   Plan: Continue current care. Breastfeeding and Lactation consult Remove outer dressing Iron supplement   Arianis Bowditch, CNM, MSN 06/02/2015. 8:16 AM

## 2015-06-02 NOTE — Addendum Note (Signed)
Addendum  created 06/02/15 [id number] by Jonna Munro, CRNA   Modules edited: Clinical Notes   Clinical Notes:  File: [id number]

## 2015-06-02 NOTE — Lactation Note (Signed)
This note was copied from a baby's chart. Lactation Consultation Note  Initial visit made.  Breastfeeding consultation services and support information given to patient.  Providing Breastmilk For Your Baby in NICU provided yesterday.  Mom is pumping every 2 1/2-3 hours followed by hand expression.  She is obtaining a few mls each pumping.  Reviewed initiation setting on pump.  Discussed milk coming to volume.  Mom has a DEBP at home but desires to obtain a rental pump at discharge.  Encouraged skin to skin and to call for assist/concerns prn.  Patient Name: Joy Reyes Today's Date: 06/02/2015 Reason for consult: Initial assessment;NICU baby;Multiple gestation   Maternal Data    Feeding    LATCH Score/Interventions                      Lactation Tools Discussed/Used Pump Review: Setup, frequency, and cleaning;Milk Storage Initiated by:: RN Date initiated:: 06/01/15   Consult Status Consult Status: Follow-up Date: 06/03/15 Follow-up type: In-patient    Ave Filter 06/02/2015, 2:00 PM

## 2015-06-03 NOTE — Lactation Note (Signed)
This note was copied from a baby's chart. Lactation Consultation Note  Met with mom in the NICU.  She states her milk volume is increasing and she is pleased.  Encouraged to continue pumping every 2-3 hours.  Mom would like a pump rental at discharge.  Paperwork left.  Will follow up in AM.  Patient Name: Joy Reyes Today's Date: 06/03/2015     Maternal Data    Feeding    LATCH Score/Interventions                      Lactation Tools Discussed/Used     Consult Status      Ave Filter 06/03/2015, 4:24 PM

## 2015-06-03 NOTE — Progress Notes (Signed)
Patient ID: Keshonna Nassar, female   DOB: Aug 27, 1985, 30 y.o.   MRN: [id number] Subjective: Postpartum Day 2: Cesarean Delivery Patient reports tolerating PO, + flatus, + BM and no problems voiding.    Objective: Vital signs in last 24 hours: Temp:  [98.3 F (36.8 C)-98.9 F (37.2 C)] 98.3 F (36.8 C) (04/11 1202) Pulse Rate:  [47-62] 59 (04/11 1202) Resp:  [14-18] 16 (04/11 1202) BP: (112-127)/(66-79) 121/78 mmHg (04/11 1202) SpO2:  [98 %-100 %] 100 % (04/11 1202)  Physical Exam:  General: alert Lochia: appropriate Uterine Fundus: firm Incision: healing well, no significant drainage, no dehiscence DVT Evaluation: No evidence of DVT seen on physical exam.   Recent Labs  05/31/15 2330 06/02/15 0510  HGB 11.0* 9.3*  HCT 32.4* 27.7*    Assessment/Plan: Status post Cesarean section. Doing well postoperatively.  Continue current care.  Warren A 06/03/2015, 3:44 PM

## 2015-06-04 ENCOUNTER — Encounter (HOSPITAL_COMMUNITY): Payer: Self-pay | Admitting: Obstetrics & Gynecology

## 2015-06-04 MED ORDER — OXYCODONE-ACETAMINOPHEN 5-325 MG PO TABS: 1.0000 | ORAL_TABLET | Freq: Four times a day (QID) | ORAL | Status: DC | PRN

## 2015-06-04 MED ORDER — FERROUS SULFATE 325 (65 FE) MG PO TABS: 325.0000 mg | ORAL_TABLET | Freq: Two times a day (BID) | ORAL | Status: DC

## 2015-06-04 MED ORDER — IBUPROFEN 600 MG PO TABS: 600.0000 mg | ORAL_TABLET | Freq: Four times a day (QID) | ORAL | Status: DC

## 2015-06-04 NOTE — Progress Notes (Signed)
Pt left room to NICU. Pts husband cleared room of their belongings and will be helping pt to car when the leave. Joy Reyes

## 2015-06-04 NOTE — Lactation Note (Signed)
This note was copied from a baby's chart. Lactation Consultation Note  Follow up visit made prior to mom's discharge.  Mom is pumping every 2-3 hours and obtaining 50-60 mls of transitional milk.  Breasts are full and slightly engorged.  She has been using the initiation setting.  Instructed to change setting to standard and to pump until dripping.  If breasts become firmer and more uncomfortable today recommended ice packs between feedings and pumping more often.  Mom has a medela DEBP at home but would like to rent a symphony pump.  2 week rental completed.  Encouraged to call with questions/concerns.  Patient Name: Joy Reyes [id number] Date: 06/04/2015     Maternal Data    Feeding Feeding Type: Breast Milk Length of feed: 30 min  LATCH Score/Interventions                      Lactation Tools Discussed/Used     Consult Status      Ave Filter 06/04/2015, 10:07 AM

## 2015-06-04 NOTE — Progress Notes (Signed)
[facility] [place] Discharge Summary   Patient Name:   Joy Reyes DOB:     1985-09-18 MRN:     [id number]  Date of Admission:   05/31/2015 Date of Discharge:  06/04/2015  Admitting diagnosis:    76 WKS, WATER BROKE Active Problems:   Preterm premature rupture of membranes in third trimester   Dichorionic diamniotic twin pregnancy   Personal history of breast cancer   H/O bilateral salpingectomy   Twin delivery by C-section  Preterm Pregnancy Delivered and Anemia    Discharge diagnosis:    6 WKS, WATER BROKE Active Problems:   Preterm premature rupture of membranes in third trimester   Dichorionic diamniotic twin pregnancy   Personal history of breast cancer   H/O bilateral salpingectomy   Twin delivery by C-section  Preterm Pregnancy Delivered and Anemia                                                                     Post partum procedures: none  Type of Delivery:  CS  Delivering Provider:    Aanya, Kofron [id number]  [place], EMA   Deatra Ina Monifah [id number]  Alesia Richards, EMA   Date of Delivery:  06/01/15  Newborn Data:      Yalonda, Dorvil [id number]  Live born female  Birth Weight: 4 lb 5.1 oz (1960 g) APGAR: 8, 9   Marquita, Levitt [id number]  Live born female  Birth Weight: 4 lb 14 oz (2210 g) APGAR: 8, 2  Baby's Name:  Jonathon Resides Baby Feeding:   breast pumping Disposition:   NICU  Complications:   None  Hospital course:    Mag, BMZ, preterm CS  Onset of Labor With Unplanned C/S  30 y.o. yo G1P0102 at [redacted]w[redacted]d was admitted in Latent Labor on 05/31/2015. Patient had a labor course significant for PPROM  IVF twins. Membrane Rupture Time/Date:    Mckinsey, Shorb [id number]  8:35 PM   Janan, Krausz [id number]  10:11 AM ,   Malachi, Alderete [id number]  05/31/2015   Romani, Defiore [id number]  06/01/2015   The patient went for cesarean section due to  [facility], and delivered a Viable infant,   Pete, Striano [id number]  06/01/2015   Anneli, Klages [id number]  06/01/2015  Details of operation can be found in separate operative note. Patient had an uncomplicated postpartum course.  She is ambulating,tolerating a regular diet, passing flatus, and urinating well.  Patient is discharged home in stable condition 06/04/2015.  Physical Exam:   Filed Vitals:   06/03/15 1202 06/03/15 1906 06/03/15 2145 06/04/15 0509  BP: 121/78 123/74 116/84 114/63  Pulse: 59 59 50 48  Temp: 98.3 F (36.8 C) 98.4 F (36.9 C) 98.3 F (36.8 C) 97.9 F (36.6 C)  TempSrc: Oral Oral Oral Oral  Resp: 16 16 16 16   Height:      Weight:      SpO2: 100% 100% 99% 98%   General: alert, cooperative and no distress Lochia: appropriate Uterine Fundus: firm Incision: Healing well with no significant drainage DVT Evaluation: No evidence of DVT seen on physical exam.  Labs: Lab Results  Component Value Date   WBC 8.8 06/02/2015  HGB 9.3* 06/02/2015   HCT 27.7* 06/02/2015   MCV 90.5 06/02/2015   PLT 141* 06/02/2015   No flowsheet data found.  Discharge instruction: per After Visit Summary and "Baby and Me Booklet".  After Visit Meds:    Medication List    TAKE these medications        CALCIUM PO  Take 1 tablet by mouth daily.     ferrous sulfate 325 (65 FE) MG tablet  Take 1 tablet (325 mg total) by mouth 2 (two) times daily with a meal.     ibuprofen 600 MG tablet  Commonly known as:  ADVIL,MOTRIN  Take 1 tablet (600 mg total) by mouth every 6 (six) hours.     oxyCODONE-acetaminophen 5-325 MG tablet  Commonly known as:  PERCOCET/ROXICET  Take 1-2 tablets by mouth every 6 (six) hours as needed (pain scale 4-7).     prenatal multivitamin Tabs tablet  Take 1 tablet by mouth daily at 12 noon.     thyroid 60 MG tablet  Commonly known as:  ARMOUR  Take 60 mg by mouth daily before breakfast.        Diet: routine  diet  Activity: Advance as tolerated. Pelvic rest for 6 weeks.   Outpatient follow up:6 weeks Follow up Appt:No future appointments. Follow up visit: No Follow-up on file.  Postpartum contraception: None  06/04/2015 Sparsh Callens, CNM

## 2015-06-04 NOTE — Progress Notes (Signed)
Pt and her husband/FOB, verbalize understanding of d/c instructions, medications, when to seek medical attention, and belongings policy. No IV at time of d/c teaching. Pt was encouraged to check room thoroughly prior to leaving. All questions were answered to patient/husband's satisfaction. Pt was given a copy of d/c instructions and her percocet RX. Pt wants to pump prior to leaving. Joy Reyes

## 2015-06-04 NOTE — Progress Notes (Signed)
CSW attempted to meet with parents to introduce services, offer support, and complete assessment due to babies' admission to NICU at 33.1 weeks, but parents had visitors and were then not in MOB's room.  CSW will attempt again at a later time.

## 2015-06-04 NOTE — Progress Notes (Signed)
CSW attempted to meet with parents a third time this morning, but they were not in MOB's room at this time.  CSW will attempt again at a later time to offer support.

## 2015-06-05 ENCOUNTER — Ambulatory Visit: Payer: Self-pay

## 2015-06-05 NOTE — Lactation Note (Addendum)
This note was copied from a baby's chart. Lactation Consultation Note  Patient Name: Joy Reyes [id number] Date: 06/05/2015 Reason for consult: Follow-up assessment;NICU baby;Multiple gestation Twins are 49 days old. Mom's breasts are engorged, and both are very tight. Assisted with icing, DEBP, massaging and hand expression, and mom has transitional milk that is flowing well. Mom able to pump over an ounce from left breast, and 15-20 ml from right breast. 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 Discussed with mom how this procedure might impact her right breast. Mom reports increased comfort after pumping. Mom able to hand express after pumping and have more colostrum flowing. Enc mom to continue to ice, massage and hand express until breasts are softened. Discussed the need to soften breast in order to maintain a good milk supply. Enc mom to get lots of rest and focus on pumping and hand expressing in order to soften breasts. Enc mom to ice for 15 minutes at a time.  Maternal Data Has patient been taught Hand Expression?: Yes Does the patient have breastfeeding experience prior to this delivery?: No  Feeding Feeding Type: Breast Milk Length of feed: 30 min  LATCH Score/Interventions                      Lactation Tools Discussed/Used     Consult Status Consult Status: PRN    [name] 06/05/2015, 1:31 PM

## 2015-06-09 ENCOUNTER — Ambulatory Visit: Payer: Self-pay

## 2015-06-09 NOTE — Lactation Note (Signed)
This note was copied from a baby's chart. Lactation Consultation Note  Patient Name: Joy Reyes Today's Date: 06/09/2015    Twins 85 days old. Mom still engorged, pumping over 2 ounces on average from left breast and maybe 1/2 to 1 ounce from right breast--right breast lumpectomy 2012. Fitted mom with #30 flange on right breast and #36 flange on left breast. Discussed how to determine later if she needs to reduce the size of the flange and enc calling LC for fitting. Assisted mom again with icing, massage, pumping and then hand expression and mom's milk flowing more freely. Enc mom to continue with same procedure every 2-3 hours with the goal of softening her breasts. The inner aspect of right breast--the area of the lumpectomy is soft and does not appear to be producing well. Right breast about 1/3 the size of left breast. Mom is visibly exhausted. Enc mom to soften breast, power-pump just before bed, sleep 4-5 hours, and then pump as soon as she gets up in the morning, and every 3 hours for a total of 3 times. Enc mom to pump if her breast fullness wakes her up, and pump a minimum of 8 times a day.    Maternal Data    Feeding Feeding Type: Breast Milk Length of feed: 45 min  LATCH Score/Interventions                      Lactation Tools Discussed/Used     Consult Status      [name] 06/09/2015, 1:42 PM

## 2015-06-10 ENCOUNTER — Ambulatory Visit: Payer: Self-pay

## 2015-06-10 NOTE — Lactation Note (Signed)
This note was copied from a baby's chart. Lactation Consultation Note  Patient Name: Joy Reyes [id number] Date: 06/10/2015 Reason for consult: Follow-up assessment;NICU baby NICU baby 34 days old. Assisted mom with latching baby to right breast in cross-cradle position. Baby not willing to latch. Provided suck training with this LC's gloved finger, and baby willing to suckle a couple of times intermittently, but would not continue to suckle. Discussed benefits of STS, nuzzling and latching attempts. Baby did lick at mom's nipple and able to get a few drops of EBM. Enc mom to continue holding baby at breast while baby tube fed.   Maternal Data    Feeding Feeding Type: Breast Fed Length of feed: 0 min  LATCH Score/Interventions Latch: Too sleepy or reluctant, no latch achieved, no sucking elicited. Intervention(s): Skin to skin;Waking techniques Intervention(s): Adjust position;Assist with latch;Breast compression  Audible Swallowing: None Intervention(s): Skin to skin;Hand expression  Type of Nipple: Everted at rest and after stimulation  Comfort (Breast/Nipple): Soft / non-tender     Hold (Positioning): Assistance needed to correctly position infant at breast and maintain latch. Intervention(s): Breastfeeding basics reviewed  LATCH Score: 5  Lactation Tools Discussed/Used     Consult Status Consult Status: PRN    [name] 06/10/2015, 5:12 PM

## 2015-06-10 NOTE — Lactation Note (Signed)
This note was copied from a baby's chart. Lactation Consultation Note  Patient Name: Joy Reyes Today's Date: 06/10/2015   NICU twins, 24 days old. Mom reports that her breasts are much more comfortable today, and both breasts are much more soft. Mom states that sleeping at night and resting during the day have made a huge difference in her ability to pump and soften breasts. Mom reports that she is able to empty her breasts more quickly now. Mom also states that she is continuing to power-pump just before bed to increase her breast milk supply and d/t lengthy engorgement phase and the fact that she has twins. Mom also reports that the increased size of flanges has made her more comfortable as well. Mom did request comfort gels and they were given. Mom has been consistently using EBM and coconut oil on her nipples, but they are sore from all the pumping and hand expressing she has been doing.   Maternal Data    Feeding Feeding Type: Breast Milk Length of feed: 45 min  LATCH Score/Interventions                      Lactation Tools Discussed/Used     Consult Status      [name] 06/10/2015, 4:23 PM

## 2015-06-16 NOTE — Discharge Summary (Signed)
[facility] [place] Discharge Summary   Patient Name: Joy Reyes DOB: [birth year] MRN: [id number]  Date of Admission: 05/31/2015 Date of Discharge:06/04/2015  Admitting diagnosis:  32 WKS, WATER BROKE Active Problems:  Preterm premature rupture of membranes in third trimester  Dichorionic diamniotic twin pregnancy  Personal history of breast cancer  H/O bilateral salpingectomy  Twin delivery by C-section  Preterm Pregnancy Delivered and Anemia  Discharge diagnosis:   47 WKS, WATER BROKE Active Problems:  Preterm premature rupture of membranes in third trimester  Dichorionic diamniotic twin pregnancy  Personal history of breast cancer  H/O bilateral salpingectomy  Twin delivery by C-section  Preterm Pregnancy Delivered and Anemia    Post partum procedures:none  Type of Delivery:CS  Delivering Provider:   Kayin, Reyes [id number]  Joy Reyes   Joy Reyes Kaylene [id number]  Joy Reyes   Date of Delivery:06/01/15  Newborn Data:    Joy Reyes [id number]  Live born female  Birth Weight: 4 lb 5.1 oz (1960 g) APGAR: 8, 9   Joy Reyes [id number]  Live born female  Birth Weight: 4 lb 14 oz (2210 g) APGAR: 8, 2  37 Name:Joy Reyes adn Joy Reyes Baby Feeding: breast pumping Disposition:NICU  Complications: None  Reyes course: Mag, BMZ, preterm CS  Onset of Labor With Unplanned C/S 30 y.o. yo G1P0102 at [redacted]w[redacted]d was admitted in Latent Labor on 05/31/2015. Patient had a  labor course significant for PPROM IVF twins. Membrane Rupture Time/Date:    Joy Reyes [id number]  8:35 PM   Joy Reyes [id number]  10:11 AM ,   Joy Reyes [id number]  05/31/2015   Joy Reyes [id number]  06/01/2015   The patient went for cesarean section due to Joy Reyes, and delivered a Viable infant,   Joy Reyes [id number]  06/01/2015   Joy Reyes [id number]  06/01/2015  Details of operation can be found in separate operative note. Patient had an uncomplicated postpartum course. She is ambulating,tolerating a regular diet, passing flatus, and urinating well. Patient is discharged home in stable condition 06/04/2015.  Physical Exam:  Filed Vitals:   06/03/15 1202 06/03/15 1906 06/03/15 2145 06/04/15 0509  BP: 121/78 123/74 116/84 114/63  Pulse: 59 59 50 48  Temp: 98.3 F (36.8 C) 98.4 F (36.9 C) 98.3 F (36.8 C) 97.9 F (36.6 C)  TempSrc: Oral Oral Oral Oral  Resp: 16 16 16 16   Height:      Weight:      SpO2: 100% 100% 99% 98%   General: alert, cooperative and no distress Lochia: appropriate Uterine Fundus: firm Incision: Healing well with no significant drainage DVT Evaluation: No evidence of DVT seen on physical exam.  Labs:  Recent Labs    Lab Results  Component Value Date   WBC 8.8 06/02/2015   HGB 9.3* 06/02/2015   HCT 27.7* 06/02/2015   MCV 90.5 06/02/2015   PLT 141* 06/02/2015     No flowsheet data found.  Discharge instruction: per After Visit Summary and "Baby and Me Booklet".  After Visit Meds:    Medication List    TAKE these medications       CALCIUM PO  Take 1 tablet by mouth daily.     ferrous sulfate 325 (65 FE) MG tablet  Take 1 tablet (325 mg total) by mouth 2 (two) times daily with a meal.     ibuprofen 600 MG tablet  Commonly known  as: ADVIL,MOTRIN  Take  1 tablet (600 mg total) by mouth every 6 (six) hours.     oxyCODONE-acetaminophen 5-325 MG tablet  Commonly known as: PERCOCET/ROXICET  Take 1-2 tablets by mouth every 6 (six) hours as needed (pain scale 4-7).     prenatal multivitamin Tabs tablet  Take 1 tablet by mouth daily at 12 noon.     thyroid 60 MG tablet  Commonly known as: ARMOUR  Take 60 mg by mouth daily before breakfast.        Diet: routine diet  Activity: Advance as tolerated. Pelvic rest for 6 weeks.   Outpatient follow up:6 weeks Follow up Appt:No future appointments. Follow up visit: No Follow-up on file.  Postpartum contraception: None  06/04/2015 Joy Reyes, CNM

## 2015-06-18 ENCOUNTER — Ambulatory Visit: Payer: Self-pay

## 2015-06-18 NOTE — Lactation Note (Signed)
This note was copied from a baby's chart. Lactation Consultation Note  Visit with mom in the NICU.  She states pumping is going well and supply is good.  She has offered breast to both babies and states [name] is doing best.  Reminded of preterm feeding norm and the need for most babies to reach term before consistently transferring full feeds from breast.  Discussed the possible use of a nipple shield to stimulate oral activity and suckling.  Mom will call with concerns or latch assist prn.  Patient Name: Joy Reyes Today's Date: 06/18/2015     Maternal Data    Feeding Feeding Type: Breast Milk Nipple Type: Slow - flow Length of feed: 30 min  LATCH Score/Interventions                      Lactation Tools Discussed/Used     Consult Status      Joy Reyes 06/18/2015, 1:29 PM

## 2015-06-19 ENCOUNTER — Ambulatory Visit: Payer: Self-pay

## 2015-06-19 NOTE — Lactation Note (Signed)
This note was copied from a baby's chart. Lactation Consultation Note  Patient Name: Boy Adalay Rucker Today's Date: 06/19/2015 Reason for consult: Follow-up assessment;NICU baby;Infant < 6lbs;Multiple gestation   Follow up with mom at bedside of twins [name] and Cristie Hem. Infant are now 53 weeks old with corrected GA of 35w 5 days. Both infants are going to breast about once a day. Mom reports that [name] tends to stay at the breast more than Alex. Mom reports Cristie Hem tends to be fretful at breast and will pull on an off. I suspect it is due to delay of flow with latch. Advised mom to pump to get milk flowing or to offer 10 cc EBM in bottle and then put infant on breast to give instant gratification. Enc massage of breast with feeding also.   Mom reports she feels pumping is going well. She is concerned with supply and is barely keeping up with infants who collectively eat 98 cc every 3 hours. Mom reports she is power pumping at night and am and otherwise pumping every 2-3 hours. She has a history of Breast CA and surgery to left breast. Left breast produces about 1/2-1 oz/pumping and other side about twice as much. She is using the Symphony pump around the clock and just purchased one on [employer] for use. She has a Spectra Pump and does not feel it allows her to empty her breast with pumping.   Enc her to keep up the good work. She is using Mother's Milk Tea. Information/Handout given on Fenugreek. Enc mom to call with further questions/concerns as needed. Follow up as needed.    Maternal Data Does the patient have breastfeeding experience prior to this delivery?: No  Feeding Feeding Type: Breast Milk Nipple Type: Slow - flow Length of feed: 40 min  LATCH Score/Interventions                      Lactation Tools Discussed/Used Pump Review: Setup, frequency, and cleaning   Consult Status Consult Status: PRN Follow-up type: Call as needed    Donn Pierini 06/19/2015, 2:47  PM

## 2015-06-24 ENCOUNTER — Ambulatory Visit: Payer: Self-pay

## 2015-06-24 NOTE — Lactation Note (Signed)
This note was copied from a baby's chart. Lactation Consultation Note  Patient Name: Joy Reyes Today's Date: 06/24/2015     Mom reports that she has been able to increase her supply by 6 oz a day with addition of Fenugreek. She asked if she can power pump more than once a day. Advised her she can try twice. Mom voiced understanding. Follow up prn.   Maternal Data    Feeding Feeding Type: Breast Milk Nipple Type: Slow - flow Length of feed: 25 min  LATCH Score/Interventions                      Lactation Tools Discussed/Used     Consult Status      Donn Pierini 06/24/2015, 12:38 PM

## 2015-06-26 ENCOUNTER — Ambulatory Visit: Payer: Self-pay

## 2015-06-26 NOTE — Lactation Note (Signed)
This note was copied from a baby's chart. Lactation Consultation Note  Follow up visit with mom in the NICU.  She states her supply is good and no concerns with pumping.  Twin A is home and mom attempting to latch but baby not showing much interest.  Recommended she continue to attempt and a 20 mm nipple shield given with instructions to try.  Reminded her that babies are not term yet and breastfeeding should improve with maturity.  I encouraged an outpatient appointment after both babies are home for a week.   Patient Name: Joy Reyes [id number] Date: 06/26/2015     Maternal Data    Feeding Feeding Type: Breast Milk Nipple Type: Dr. Myra Gianotti Preemie Length of feed: 50 min (PO x 20/NG x30)  LATCH Score/Interventions                      Lactation Tools Discussed/Used     Consult Status      Ave Filter 06/26/2015, 5:09 PM

## 2015-07-01 ENCOUNTER — Ambulatory Visit: Payer: Self-pay

## 2015-07-01 NOTE — Lactation Note (Signed)
This note was copied from a baby's chart. Lactation Consultation Note  Patient Name: Joy Reyes [id number] Date: 07/01/2015   NICU baby 53 weeks old. Mom reports that left breast engorged. Mom also states that she had a plugged duct that has since subsided. Accompanied mom to pumping room in NICU and examined mom's breasts and enc her to start pumping. Mom able to massage and pump left breast until breast softened. Mom had a small hard area right behind her nipple the first time she became engorged, and now has an area on the outer aspect of her left breast that seems to be clogged. Discussed methods of engorgement prevention and treatment, and mom reports that she has ordered Lecithin. Mom reports that her supply increased when Baby "A" discharged home, while her ability to keep up with her pumping became more challenging with the increased demands of caring for baby at home. Discussed how to protect her milk supply. Enc mom to inspect her breasts after pumping and massage and hand express any areas not softened by DEBP use.    Maternal Data    Feeding Feeding Type: Breast Milk Nipple Type: Dr. Myra Gianotti Preemie Length of feed: 40 min (10 min PO, 30 min NG)  LATCH Score/Interventions                      Lactation Tools Discussed/Used     Consult Status      [name] 07/01/2015, 5:54 PM

## 2015-10-15 ENCOUNTER — Encounter (HOSPITAL_COMMUNITY): Payer: Self-pay

## 2015-10-15 ENCOUNTER — Emergency Department (HOSPITAL_COMMUNITY)
Admission: EM | Admit: 2015-10-15 | Discharge: 2015-10-15 | Disposition: A | Discharge status: Home / Self Care | Payer: 59 | Attending: Emergency Medicine | Admitting: Emergency Medicine

## 2015-10-15 DIAGNOSIS — Z79899 Other long term (current) drug therapy: Secondary | ICD-10-CM | POA: Diagnosis not present

## 2015-10-15 DIAGNOSIS — Z853 Personal history of malignant neoplasm of breast: Secondary | ICD-10-CM | POA: Insufficient documentation

## 2015-10-15 DIAGNOSIS — Z791 Long term (current) use of non-steroidal anti-inflammatories (NSAID): Secondary | ICD-10-CM | POA: Insufficient documentation

## 2015-10-15 DIAGNOSIS — Z209 Contact with and (suspected) exposure to unspecified communicable disease: Secondary | ICD-10-CM

## 2015-10-15 DIAGNOSIS — Z203 Contact with and (suspected) exposure to rabies: Secondary | ICD-10-CM | POA: Diagnosis not present

## 2015-10-15 MED ORDER — RABIES IMMUNE GLOBULIN 150 UNIT/ML IM INJ
20.0000 [IU]/kg | INJECTION | Freq: Once | INTRAMUSCULAR | Status: AC
  Administered 2015-10-15: 1425 [IU] via INTRAMUSCULAR
  Filled 2015-10-15: qty 9.5

## 2015-10-15 MED ORDER — RABIES IMMUNE GLOBULIN 150 UNIT/ML IM INJ: 20.0000 [IU]/kg | INJECTION | Freq: Once | INTRAMUSCULAR | Status: DC

## 2015-10-15 MED ORDER — RABIES VACCINE, PCEC IM SUSR
1.0000 mL | Freq: Once | INTRAMUSCULAR | Status: AC
  Administered 2015-10-15: 1 mL via INTRAMUSCULAR
  Filled 2015-10-15: qty 1

## 2015-10-15 NOTE — ED Provider Notes (Signed)
[facility] DEPT Provider Note   CSN: [id number] Arrival date & time: 10/15/15  1153  By signing my name below, I, Jeanell Sparrow, attest that this documentation has been prepared under the direction and in the presence of non-physician practitioner, [address] PA-C. Electronically Signed: Jeanell Sparrow, Scribe. 10/15/2015. 12:23 PM.  History   Chief Complaint Chief Complaint  Patient presents with  . Animal Bite   The history is provided by the patient. No language interpreter was used.   HPI Comments: Joy Reyes is a 30 y.o. female who presents to the Emergency Department complaining of a bat exposure that occurred last night at around midnight. Pt reports that she woke up to a bat in her room, and she is unsure if bat bit her and her family members, though they haven't found any bites or scratches on them. She states that she called the [employer] and someone came to take care of the bat, it is in the possession of the bat pest control man, but has died and they were told it couldn't be tested once it died. She reports she did not notice any bite marks or wounds. She denies any allergies, medical problems, or chance of pregnancy. She also denies any fever, chills, CP, SOB, abd pain, n/v/d/c, dysuria, hematuria, numbness/tingling, weakness, or rashes.    Past Medical History:  Diagnosis Date  . Cancer [facility])    breast cancer 2012    Patient Active Problem List   Diagnosis Date Noted  . Twin delivery by C-section 06/02/2015  . Preterm premature rupture of membranes in third trimester 06/01/2015  . Dichorionic diamniotic twin pregnancy 06/01/2015  . Personal history of breast cancer 06/01/2015  . H/O bilateral salpingectomy 06/01/2015    Past Surgical History:  Procedure Laterality Date  . BREAST SURGERY     lumpectomy on right breast  . CESAREAN SECTION N/A 06/01/2015   Procedure: CESAREAN SECTION;  Surgeon: Waymon Amato, MD;  Location: [facility];  Service: Obstetrics;   Laterality: N/A;  . LOBECTOMY Right 2012  . SALPINGECTOMY      OB History    Gravida Para Term Preterm AB Living   1 1   1   2    SAB TAB Ectopic Multiple Live Births         1 2       Home Medications    Prior to Admission medications   Medication Sig Start Date End Date Taking? Authorizing Provider  CALCIUM PO Take 1 tablet by mouth daily.    Historical Provider, MD  ferrous sulfate 325 (65 FE) MG tablet Take 1 tablet (325 mg total) by mouth 2 (two) times daily with a meal. 06/04/15   Venus Standard, CNM  ibuprofen (ADVIL,MOTRIN) 600 MG tablet Take 1 tablet (600 mg total) by mouth every 6 (six) hours. 06/04/15   Venus Standard, CNM  oxyCODONE-acetaminophen (PERCOCET/ROXICET) 5-325 MG tablet Take 1-2 tablets by mouth every 6 (six) hours as needed (pain scale 4-7). 06/04/15   Venus Standard, CNM  Prenatal Vit-Fe Fumarate-FA (PRENATAL MULTIVITAMIN) TABS tablet Take 1 tablet by mouth daily at 12 noon.    Historical Provider, MD  thyroid (ARMOUR) 60 MG tablet Take 60 mg by mouth daily before breakfast.    Historical Provider, MD    Family History History reviewed. No pertinent family history.  Social History Social History  Substance Use Topics  . Smoking status: Never Smoker  . Smokeless tobacco: Not on file  . Alcohol use No  Allergies   Review of patient's allergies indicates no known allergies.   Review of Systems Review of Systems  Constitutional: Negative for chills and fever.  Respiratory: Negative for shortness of breath.   Cardiovascular: Negative for chest pain.  Gastrointestinal: Negative for abdominal pain, constipation, diarrhea, nausea and vomiting.  Genitourinary: Negative for dysuria and hematuria.  Musculoskeletal: Negative for arthralgias and myalgias.  Skin: Negative for color change and wound.  Allergic/Immunologic: Negative for immunocompromised state.  Neurological: Negative for weakness and numbness.  Psychiatric/Behavioral: Negative for  confusion.  10 Systems reviewed and are negative for acute change except as noted in the HPI.    Physical Exam Updated Vital Signs BP 115/71 (BP Location: Right Arm)   Pulse (!) 59   Temp 98.1 F (36.7 C) (Oral)   Resp 16   Ht 5\' 8"  (1.727 m)   Wt 156 lb 6 oz (70.9 kg)   SpO2 97%   BMI 23.78 kg/m   Physical Exam  Constitutional: She is oriented to person, place, and time. Vital signs are normal. She appears well-developed and well-nourished.  Non-toxic appearance. No distress.  Afebrile, nontoxic, NAD  HENT:  Head: Normocephalic and atraumatic.  Mouth/Throat: Oropharynx is clear and moist and mucous membranes are normal.  Eyes: Conjunctivae and EOM are normal. Right eye exhibits no discharge. Left eye exhibits no discharge.  Neck: Normal range of motion. Neck supple.  Cardiovascular: Normal rate, regular rhythm, normal heart sounds and intact distal pulses.  Exam reveals no gallop and no friction rub.   No murmur heard. Pulmonary/Chest: Effort normal and breath sounds normal. No respiratory distress. She has no decreased breath sounds. She has no wheezes. She has no rhonchi. She has no rales.  Abdominal: Soft. Normal appearance and bowel sounds are normal. She exhibits no distension. There is no tenderness. There is no rigidity, no rebound, no guarding, no CVA tenderness, no tenderness at McBurney's point and negative Murphy's sign.  Musculoskeletal: Normal range of motion.  Neurological: She is alert and oriented to person, place, and time. She has normal strength. No sensory deficit.  Skin: Skin is warm, dry and intact. No abrasion, no laceration and no rash noted.  No abrasion or bite marks over exposed surfaces.   Psychiatric: She has a normal mood and affect. Her behavior is normal.  Nursing note and vitals reviewed.    ED Treatments / Results  DIAGNOSTIC STUDIES: Oxygen Saturation is 97% on RA, normal by my interpretation.    COORDINATION OF CARE: 12:27 PM- Pt advised  of plan for treatment, which includes medication, and pt agrees.  Labs (all labs ordered are listed, but only abnormal results are displayed) Labs Reviewed - No data to display  EKG  EKG Interpretation None       Radiology No results found.  Procedures Procedures (including critical care time)  Medications Ordered in ED Medications  rabies vaccine (RABAVERT) injection 1 mL (not administered)  rabies immune globulin (HYPERAB) injection 1,425 Units (not administered)     Initial Impression / Assessment and Plan / ED Course  I have reviewed the triage vital signs and the nursing notes.  Pertinent labs & imaging results that were available during my care of the patient were reviewed by me and considered in my medical decision making (see chart for details).  Clinical Course    30 y.o. female here with bat exposure, unknown bite, bat in possession of bat animal pest control company but has died, and they were told it couldn't  be tested. Discussed trying to get the bat tested so they could potentially stop vaccines early if it tested neg, but if unable to test then continue vaccines as scheduled. F/up with [facility] or PCP for remainder of vaccines. I explained the diagnosis and have given explicit precautions to return to the ER including for any other new or worsening symptoms. The patient understands and accepts the medical plan as it's been dictated and I have answered their questions. Discharge instructions concerning home care and prescriptions have been given. The patient is STABLE and is discharged to home in good condition.   Final Clinical Impressions(s) / ED Diagnoses   Final diagnoses:  Need for post exposure prophylaxis for rabies  Exposure to bat without known bite    New Prescriptions New Prescriptions   No medications on file   I personally performed the services described in this documentation, which was scribed in my presence. The recorded information has been  reviewed and is accurate.     [address] [place], PA-C 10/15/15 1256    Charlesetta Shanks, MD 10/18/15 1432

## 2015-10-15 NOTE — Discharge Instructions (Signed)
You have been vaccinated against rabies today due to your bat exposure. You will need to have ongoing vaccines using the schedule provided below, unless the animal control officer advises you that the bat tested NEGATIVE. If the bat is not tested, or tests positive, you must COMPLETE the vaccine schedule. You can have the vaccines done at your regular doctor or at the urgent care listed above. Follow up with your regular doctor and/or urgent care for ongoing management of your vaccines. Return to the ER for changes or worsening symptoms.

## 2015-10-18 ENCOUNTER — Encounter (HOSPITAL_COMMUNITY): Payer: Self-pay | Admitting: Emergency Medicine

## 2015-10-18 ENCOUNTER — Emergency Department (HOSPITAL_COMMUNITY)
Admission: EM | Admit: 2015-10-18 | Discharge: 2015-10-18 | Disposition: A | Discharge status: Home / Self Care | Payer: 59 | Attending: Emergency Medicine | Admitting: Emergency Medicine

## 2015-10-18 DIAGNOSIS — Z79899 Other long term (current) drug therapy: Secondary | ICD-10-CM | POA: Diagnosis not present

## 2015-10-18 DIAGNOSIS — Z853 Personal history of malignant neoplasm of breast: Secondary | ICD-10-CM | POA: Diagnosis not present

## 2015-10-18 DIAGNOSIS — Z791 Long term (current) use of non-steroidal anti-inflammatories (NSAID): Secondary | ICD-10-CM | POA: Insufficient documentation

## 2015-10-18 DIAGNOSIS — Z203 Contact with and (suspected) exposure to rabies: Secondary | ICD-10-CM | POA: Diagnosis present

## 2015-10-18 DIAGNOSIS — Z23 Encounter for immunization: Secondary | ICD-10-CM | POA: Diagnosis not present

## 2015-10-18 MED ORDER — RABIES VACCINE, PCEC IM SUSR
1.0000 mL | Freq: Once | INTRAMUSCULAR | Status: AC
  Administered 2015-10-18: 1 mL via INTRAMUSCULAR
  Filled 2015-10-18: qty 1

## 2015-10-18 NOTE — ED Notes (Signed)
Bed: WTR8 Expected date:  Expected time:  Means of arrival:  Comments: 

## 2015-10-18 NOTE — ED Triage Notes (Signed)
Pt seen earlier this week for bat exposure, given rabies shots. Here for second round of shots. Pt originally told to follow up at urgent care. No complaints at this time.

## 2015-10-18 NOTE — ED Provider Notes (Signed)
[facility] DEPT Provider Note   CSN: [id number] Arrival date & time: 10/18/15  1204  By signing my name below, I, Joy Reyes, attest that this documentation has been prepared under the direction and in the presence of non-physician practitioner, Alvino Blood PA-C. Electronically Signed: Dolores Reyes, Scribe. 10/18/2015. 12:35 PM.  History   Chief Complaint Chief Complaint  Patient presents with  . Rabies Injection  . Follow-up   The history is provided by the patient. No language interpreter was used.     HPI Comments:  Joy Reyes is a 30 y.o. female who presents to the Emergency Department for prophylatic rabies treatment following bat exposure 3 days ago. Patient reports bat in bedroom approximately 3 nights ago. Bat was killed and is currently being analyzed. Patient was seen with family in ED and post-exposure prophylaxis was initiated. She was instructed to return in 3 days for repeat vaccine. Pt denies any fever, rash, nausea, vomiting, myalgias, numbness, weakness, changes in vision, or changes in eating habits.    Past Medical History:  Diagnosis Date  . Cancer [facility])    breast cancer 2012    Patient Active Problem List   Diagnosis Date Noted  . Twin delivery by C-section 06/02/2015  . Preterm premature rupture of membranes in third trimester 06/01/2015  . Dichorionic diamniotic twin pregnancy 06/01/2015  . Personal history of breast cancer 06/01/2015  . H/O bilateral salpingectomy 06/01/2015    Past Surgical History:  Procedure Laterality Date  . BREAST SURGERY     lumpectomy on right breast  . CESAREAN SECTION N/A 06/01/2015   Procedure: CESAREAN SECTION;  Surgeon: Waymon Amato, MD;  Location: [facility];  Service: Obstetrics;  Laterality: N/A;  . LOBECTOMY Right 2012  . SALPINGECTOMY      OB History    Gravida Para Term Preterm AB Living   1 1   1   2    SAB TAB Ectopic Multiple Live Births         1 2       Home Medications    Prior to  Admission medications   Medication Sig Start Date End Date Taking? Authorizing Provider  CALCIUM PO Take 1 tablet by mouth daily.    Historical Provider, MD  ferrous sulfate 325 (65 FE) MG tablet Take 1 tablet (325 mg total) by mouth 2 (two) times daily with a meal. 06/04/15   Venus Standard, CNM  ibuprofen (ADVIL,MOTRIN) 600 MG tablet Take 1 tablet (600 mg total) by mouth every 6 (six) hours. 06/04/15   Venus Standard, CNM  oxyCODONE-acetaminophen (PERCOCET/ROXICET) 5-325 MG tablet Take 1-2 tablets by mouth every 6 (six) hours as needed (pain scale 4-7). 06/04/15   Venus Standard, CNM  Prenatal Vit-Fe Fumarate-FA (PRENATAL MULTIVITAMIN) TABS tablet Take 1 tablet by mouth daily at 12 noon.    Historical Provider, MD  thyroid (ARMOUR) 60 MG tablet Take 60 mg by mouth daily before breakfast.    Historical Provider, MD    Family History No family history on file.  Social History Social History  Substance Use Topics  . Smoking status: Never Smoker  . Smokeless tobacco: Not on file  . Alcohol use No     Allergies   Review of patient's allergies indicates no known allergies.   Review of Systems Review of Systems  Constitutional: Negative for appetite change and fever.  Eyes: Negative for visual disturbance.  Gastrointestinal: Negative for nausea and vomiting.  Musculoskeletal: Negative for arthralgias and myalgias.  Skin: Negative for rash.  Neurological: Negative for seizures, weakness and numbness.     Physical Exam Updated Vital Signs BP 118/68 (BP Location: Right Arm)   Pulse 70   Temp 98.2 F (36.8 C) (Oral)   Resp 16   SpO2 97%   Physical Exam  Constitutional: She appears well-developed and well-nourished. No distress.  HENT:  Head: Normocephalic and atraumatic.  Mouth/Throat: Oropharynx is clear and moist.  Eyes: Conjunctivae and EOM are normal. Pupils are equal, round, and reactive to light. Right eye exhibits no discharge. Left eye exhibits no discharge. No scleral  icterus.  Neck: Normal range of motion.  Cardiovascular: Normal rate, regular rhythm, normal heart sounds and intact distal pulses.   No murmur heard. Pulmonary/Chest: Effort normal and breath sounds normal. No respiratory distress. She has no wheezes. She has no rales.  Abdominal: She exhibits no distension.  Musculoskeletal: Normal range of motion.  Neurological: She is alert. She is not disoriented. Coordination and gait normal. GCS eye subscore is 4. GCS verbal subscore is 5. GCS motor subscore is 6.  Skin: Skin is warm and dry. No rash noted. She is not diaphoretic.  Psychiatric: She has a normal mood and affect. Her behavior is normal. Judgment normal.  Nursing note and vitals reviewed.    ED Treatments / Results  DIAGNOSTIC STUDIES:  Oxygen Saturation is 97% on RA, normal by my interpretation.    COORDINATION OF CARE:  1:26 PM Discussed treatment plan with pt at bedside and pt agreed to plan.  Labs (all labs ordered are listed, but only abnormal results are displayed) Labs Reviewed - No data to display  EKG  EKG Interpretation None       Radiology No results found.  Procedures Procedures (including critical care time)  Medications Ordered in ED Medications  rabies vaccine (RABAVERT) injection 1 mL (1 mL Intramuscular Given 10/18/15 1336)     Initial Impression / Assessment and Plan / ED Course  I have reviewed the triage vital signs and the nursing notes.  Pertinent labs & imaging results that were available during my care of the patient were reviewed by me and considered in my medical decision making (see chart for details).  Clinical Course    Patient presents to ED for Day 3 post exposure rabies vaccine. Patient presents no complaints currently. Patient is afebrile and non-toxic appearing in NAD. VSS. Physical exam re-assuring. Rabies vaccine given. Patient instructed to follow up in ED, PCP, or urgent care for Day 7 post-exposure vaccine if status of  bat still unknown. Patient is safe and stable for d/c.   I personally performed the services described in this documentation, which was scribed in my presence. The recorded information has been reviewed and is accurate.  Final Clinical Impressions(s) / ED Diagnoses   Final diagnoses:  Need for rabies vaccination    New Prescriptions Discharge Medication List as of 10/18/2015  1:44 PM        Roxanna Mew, PA-C 10/18/15 1414    Tanna Furry, MD 10/18/15 1753

## 2015-10-18 NOTE — Discharge Instructions (Addendum)
Recheck on 8/30 with urgent care, PCP, or ER for third rabies vaccination unless notified that rabies testing on bat was negative. Return to ED if develop reaction to vaccine, fever/chills, myalgias, nausea/vomiting, confusion/agitation, weakness, headache.

## 2016-06-24 ENCOUNTER — Encounter (HOSPITAL_COMMUNITY): Payer: Self-pay | Admitting: Obstetrics & Gynecology

## 2019-06-19 ENCOUNTER — Other Ambulatory Visit: Payer: Self-pay | Admitting: Oncology

## 2019-06-25 ENCOUNTER — Telehealth: Payer: Self-pay | Admitting: *Deleted

## 2019-06-25 NOTE — Telephone Encounter (Signed)
This RN spoke with pt's husband, [name], per his call to follow up per contact with primary MD and referral for recent bx of breast showing " recurrent cancer ".  Post MD review of recent studies including path- appointment time given for 445 pm tomorrow 06/26/2019,  Informed [name] of above as well as need for data per his wife's original breast cancer diagnosis.- he states she was diagnosed in 2012 in [place], [place].  He states that his wife has all her records- this RN requested for them to bring the records so we can make copies.  Address confirmed for location- no further needs at this time.

## 2019-06-25 NOTE — Progress Notes (Addendum)
[place]  Telephone:([phone]) [phone] Fax:(336) [phone]     ID: Joy Reyes DOB: 01-07-1986  MR#: [id number]  CHY#:[id number]  Patient Care Team: Patient, No Pcp Per as PCP - General (General Practice) Joy Reyes, Joy Dad, MD as Consulting Physician (Oncology) Joy Reek, MD as Referring Physician (Obstetrics and Gynecology) Joy Cruel, MD OTHER MD: PCP= Joy Sapp MD  CHIEF COMPLAINT: BRCA positive breast cancer  CURRENT TREATMENT: Neoadjuvant chemotherapy   HISTORY OF CURRENT ILLNESS: Joy Reyes has a history of breast cancer, diagnosed in 2012 while she lived in [place], [place], and treated in [place] under Dr Joy Reyes.  We are obtaining data from that experience but according to the patient her tumor was the size of an apricot.  She did not have axillary lymph node sampling or radiation.  She remembers 8 neoadjuvant chemotherapy treatments given every 2 weeks with the first including the "red drug", which suggest Cytoxan and Adriamycin in dose dense fashion x4 most likely followed by either Taxol or Taxotere in dose dense fashion x4.  After that experience and given her breast density and BRCA mutation our practice here would have been a yearly follow-up with MRI in addition to yearly mammography.   More recently Joy Reyes herself palpated a change in her right breast.  She brought it to Dr. Trecia Reyes attention and underwent bilateral diagnostic mammography with tomography and left breast ultrasonography at [facility] on 06/14/2019 showing: breast density category D; there was an area of asymmetry in the upper outer quadrant of the left breast, and ultrasound of that area showed a 2.8 cm mass.  Ultrasound of the axilla showed 1.8 cm lymph node with cortical thickening.    In the right breast they saw only postsurgical changes, with 2 biopsy clips in the retroareolar area.  Accordingly on 06/19/2019 she proceeded to biopsy of the left breast area in question. 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  The left axillary lymph node biopsy was nondiagnostic, with very spare sampling of fibroadipose and lymphoid tissue.  Estrogen receptor was negative, with less than 1% uptake, progesterone receptor was negative, with less than 1% uptake, HER-2 was negative by immunohistochemistry with a score of 1+.  The patient's subsequent history is as detailed below.   INTERVAL HISTORY: Joy "Joy Reyes" was evaluated in the breast cancer clinic on 06/26/2019 accompanied by her husband "Joy Reyes".   REVIEW OF SYSTEMS: Joy Reyes denies unusual headaches, visual changes, nausea, vomiting, stiff neck, dizziness, or gait imbalance. There has been no cough, phlegm production, or pleurisy, no chest pain or pressure, and no change in bowel or bladder habits. The patient denies fever, rash, bleeding, unexplained fatigue or unexplained weight loss.  She exercises 6 days a week. A detailed review of systems was otherwise entirely negative.   PAST MEDICAL HISTORY: Past Medical History:  Diagnosis Date   Cancer [facility])    breast cancer 2012    PAST SURGICAL HISTORY: Past Surgical History:  Procedure Laterality Date   BREAST SURGERY     lumpectomy on right breast   CESAREAN SECTION MULTI-GESTATIONAL N/A 06/01/2015   Procedure: CESAREAN SECTION MULTI-GESTATIONAL;  Surgeon: Joy Amato, MD;  Location: [facility];  Service: Obstetrics;  Laterality: N/A;   LOBECTOMY Right 2012   SALPINGECTOMY      FAMILY HISTORY: No family history on file.  The patient's parents are both turning 24 in 2021.  The paternal grandfather died from Alzheimer's  disease.  The paternal grandmother died from breast cancer at the age of 29.  The patient's father had 1 sister who had died as a child from meningitis.  He had no brothers.  On the mother side, A maternal grandmother did have breast cancer very late in life,  living to be 75.  The paternal grandfather and the only maternal aunt had no history of cancer.  The patient has 1 brother, in good health, no sisters.  GYNECOLOGIC HISTORY:  No LMP recorded. Menarche: 34 years old Age at first live birth: 34 years old [place] P twins LMP regular as of May 2021 Contraceptive HRT no  Hysterectomy?  No BSO?  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  Her husband Joy Reyes") works for an NGO (the [employer]) out of [place] which helps small agents is doing research set up and succeed.  Their twins are 33 years old.  The patient belongs to unorthodox denomination   ADVANCED DIRECTIVES: In the absence of any documentation to the contrary, the patient's spouse is their HCPOA.    HEALTH MAINTENANCE: Social History   Tobacco Use   Smoking status: Never Smoker  Substance Use Topics   Alcohol use: No   Drug use: No     Colonoscopy: n/a (age)  PAP: Up-to-date  Bone density: n/a (age)   No Known Allergies  No current outpatient medications on file.   No current facility-administered medications for this visit.    OBJECTIVE: [name] who appears well  Vitals:   06/26/19 1635  BP: (!) 138/92  Pulse: 96  Resp: 18  Temp: 98.5 F (36.9 C)  SpO2: 100%     Body mass index is 22.2 kg/m.   Wt Readings from Last 3 Encounters:  06/26/19 146 lb (66.2 kg)  10/15/15 156 lb 6 oz (70.9 kg)  05/31/15 192 lb (87.1 kg)      ECOG FS:0 - Asymptomatic  Ocular: Sclerae unicteric, pupils round and equal Ear-nose-throat: Wearing a mask Lymphatic: No cervical or supraclavicular adenopathy Lungs no rales or rhonchi Heart regular rate and rhythm Abd soft, nontender, positive bowel sounds MSK no focal spinal tenderness, no joint edema Neuro: non-focal, well-oriented, appropriate affect Breasts: There are no suspicious masses or nipple retraction in  the right breast.  There is an upper inner quadrant scar.  The right axilla is benign.  There is an easily palpable mass in the upper outer quadrant of the left breast, without overlying skin involvement or nipple retraction.  I do not palpate a mass in the left axilla.   LAB RESULTS:  CMP obtained 05/30/2019 through [facility] showed a glucose of 95 BUN 8 creatinine 0.68, sodium 141 potassium 4.6 chloride 105, CO2 21, calcium 9.8, total protein 7.2 albumin 4.9 bilirubin 0.5 alkaline phosphatase 37 AST 21 ALT 17 cholesterol 218 triglycerides 107 HDL 78 and LDL 121.  CA-125 was 34.8.  No results found for: NA, K, CL, CO2, GLUCOSE, BUN, CREATININE, CALCIUM, PROT, ALBUMIN, AST, ALT, ALKPHOS, BILITOT, GFRNONAA, GFRAA  No results found for: TOTALPROTELP, ALBUMINELP, A1GS, A2GS, BETS, BETA2SER, GAMS, MSPIKE, SPEI  Lab Results  Component Value Date   WBC 8.8 06/02/2015   HGB 9.3 (L) 06/02/2015   HCT 27.7 (L) 06/02/2015   MCV 90.5 06/02/2015   PLT 141 (L) 06/02/2015    No results found for: LABCA2  No components found for: GGEZMO294  No results for input(s):  INR in the last 168 hours.  No results found for: LABCA2  No results found for: TIR443  No results found for: XVQ008  No results found for: QPY195  No results found for: CA2729  No components found for: HGQUANT  No results found for: CEA1 / No results found for: CEA1   No results found for: AFPTUMOR  No results found for: CHROMOGRNA  No results found for: KPAFRELGTCHN, LAMBDASER, KAPLAMBRATIO (kappa/lambda light chains)  No results found for: HGBA, HGBA2QUANT, HGBFQUANT, HGBSQUAN (Hemoglobinopathy evaluation)   No results found for: LDH  No results found for: IRON, TIBC, IRONPCTSAT (Iron and TIBC)  No results found for: FERRITIN  Urinalysis No results found for: COLORURINE, APPEARANCEUR, LABSPEC, PHURINE, GLUCOSEU, HGBUR, BILIRUBINUR, KETONESUR, PROTEINUR, UROBILINOGEN, NITRITE, LEUKOCYTESUR   STUDIES: No results  found. Pathology Tissue RequestResulted: 06/21/2019 7:50 AM [facility] Component Name Value Ref Range  Case Report Surgical Pathology                Case: [id number]                 Authorizing Provider: Elmon Else, MD       Collected:      06/19/2019 1100       Ordering Location:   [facility] Radiology Department Received:      06/19/2019 1132       Pathologist:      Rowland Lathe, MD                            Specimens:  1) - Breast, Needle Core Biopsy, Left upper outer mass                        2) - Breast, Needle Core Biopsy, Left Axilla Lymph Node                  Reason for Amendment Addition of biomarkers  Comment: This value was suppressed on a previously verified result.   Final Diagnosis Breast, left (upper outer), needle core biopsy:  Invasive ductal carcinoma (grade 3).  Ductal carcinoma in situ (grade 3).  See comment.  2.   Lymph node, left axillary region, needle core biopsy:  Very sparse fragments of fibroadipose and lymphoid tissue (nondiagnostic sample).   Synoptic Report Breast Biomarker Reporting Template (BREAST: BIOMARKER REPORTING TEMPLATE - 1)  Protocol posted: 04/19/2018    Test(s) Performed:      Estrogen Receptor (ER) Status:  Negative (less than 1%)      :  Internal control cells present and stain as expected     Test Type:  Food and Drug Administration (FDA) cleared (test / vendor): [employer]     Primary Antibody:  SP1     Scoring System:  No separate scoring system used    Test(s) Performed:      Progesterone Receptor (PgR) Status:  Negative (less than 1%)      :  Internal control cells present and stain as expected     Test Type:  Food and Drug Administration (FDA) cleared (test / vendor): [employer]     Primary Antibody:  1E2     Scoring System:  No separate scoring  system used    Test(s) Performed:      HER2 by Immunohistochemistry:  Negative (Score 1+)     Test Type:  Food and Drug Administration (FDA) cleared (test / vendor): [employer]  Primary Antibody:  4B5    Cold Ischemia and Fixation Times:  Meet requirements specified in latest version of the ASCO / CAP Guidelines   METHODS   Fixative:  Formalin    Image Analysis:  Not performed    Diagnosis Comment An invasive ductal carcinoma (grade 3 in this material) was seen involving several of the needle core fragments. The greatest extent of invasive malignancy seen was approximately 4 mm. High-grade ductal carcinoma in situ was present as well.    Clinical Information Diagnosis: R92.8 - Abnormal mammogram [ICD-10-CM] #1 Time out: 1100 Time in: 1100  #2 Time out: 1104 Time in: 1104   Gross Description 1. Labeled "left upper outer mass". Received in formalin are approximately 5 yellow-white needle core biopsy fragments up to 1.6 cm, inked hematoxylin, all in one.  Tissue out 1100, in formalin 1100 on 06/19/2019 Estimated time in formalin 6 hours  2. Labeled "left axilla lymph node". Received in formalin are approximately 3 yellow-white needle core biopsy fragments, each 0.2 cm, inked hematoxylin, all in one.  Tissue out 1104, in formalin 1104 on 06/19/2019 Estimated time in formalin 7 hours   Disclaimer The above diagnosis is based on performance of thorough gross and/or microscopic evaluations. Immunoperoxidase procedures were developed and performance characteristics determined by [facility]. Not all have been cleared or approved by the U.S. Food and Drug Administration.      For Flow Cytometry and Class I analyte-specific reagents (ASRs): This test was developed and its performance characteristics determined by [facility]. It has not been cleared or approved by the [place] Food and Drug Administration. The FDA does  not require this test to go through premarket FDA review. This test is used for clinical purposes. It should not be regarded as investigational or for research. This laboratory is certified under the Clinical Laboratory Improvement Amendments (CLIA) as qualified to perform high complexity clinical laboratory testing.  [facility] CLIA Medical Director: Judithann Sauger, MD   Specimen Collected on  Tissue - Breast structure (body structure), Tissue specimen (specimen) - Breast structure (body structure) 06/19/2019 11:00 AM     ELIGIBLE FOR AVAILABLE RESEARCH PROTOCOL: [id number]?  ASSESSMENT: 34 y.o. BRCA positive Joy Reyes  (1) Status post right lumpectomy 2012 for a   (a) received neoadjuvant chemotherapy  (2) Status post left breast upper outer quadrant biopsy 06/19/2019 for a clinical T2 N1, stage IIIB invasive ductal carcinoma, grade 3, triple negative  (a) pelvic ultrasound including transvaginal study 06/01/2019 unremarkable  (b) initial left axillary lymph node biopsy nondiagnostic  (3) neoadjuvant chemotherapy  (4) definitive surgery  (5) adjuvant radiation as appropriate  (6) consider adjuvant pembrolizumab or PARP inhibitor  PLAN: I met today with Joy Reyes to review her new diagnosis. Specifically we discussed the biology of her breast cancer, its diagnosis, staging, treatment  options and prognosis. We first reviewed the fact that cancer is not one disease but more than 100 different diseases and that it is important to keep them separate-- otherwise when friends and relatives discuss their own cancer experiences with Joy Reyes confusion can result. Similarly we explained that if breast cancer spreads to the bone or liver, the patient would not have bone cancer or liver cancer, but breast cancer in the bone and breast cancer in the liver: one cancer in three places-- not 3 different cancers which otherwise would have to be treated in 3 different ways.  We  discussed the difference between local  and systemic therapy. In terms of loco-regional treatment, lumpectomy plus radiation is equivalent to mastectomy as far as survival is concerned. For this reason, and because the cosmetic results are generally superior, we recommend breast conserving surgery.   We also noted that in terms of sequencing of treatments, whether systemic therapy or surgery is done first does not affect the ultimate outcome.  This is relevant to her situation since she will get information on response but being treated neoadjuvantly, she may avoid an axillary lymph node dissection, and she may be able to participated in a study for residual disease after neoadjuvant treatment.  We then discussed the rationale for systemic therapy. There is some risk that this cancer may have already spread to other parts of her body.  And stage III disease that risk approaches 70%.  Accordingly we are proceeding with staging studies which will include CTs of the chest abdomen and pelvis, bone scan, and a breast MRI.  If there are any questionable findings we will obtain a PET scan.Marland Kitchen  Next we went over the options for systemic therapy which are anti-estrogens, anti-HER-2 immunotherapy, and chemotherapy. Andrea does not meet criteria for anti-HER-2 immunotherapy or anti-estrogens.  And triple negative disease the only systemic option available currently is chemotherapy and that is currently what I proposed  She very likely already had cyclophosphamide and doxorubicin and either docetaxel or paclitaxel.  I will most likely treat her with carboplatin and the alternative taxane. There may be room for going back to an anthracycline if response to these agents is an adequate.  The goal of course would be a complete pathologic response  She is interested in bilateral mastectomies with some kind of reconstruction.  She is expressing some interest in discussing DIEP options and therefore once everything gets going  and she is under treatment we will refer her to [facility] with that in mind.  Joy Reyes has a good understanding of the overall plan. She agrees with it. She will call with any problems that may develop before her next visit here.  Total encounter time 70 minutes.Sarajane Jews C. Serenidy Waltz, MD 06/26/2019 6:14 PM Medical Oncology and Hematology [facility] [place], [place] [address] Tel. [phone]    Fax. [phone]   This document serves as a record of services personally performed by Lurline Del, MD. It was created on his behalf by Wilburn Mylar, a trained medical scribe. The creation of this record is based on the scribe's personal observations and the provider's statements to them.   I, Lurline Del MD, have reviewed the above documentation for accuracy and completeness, and I agree with the above.   *Total Encounter Time as defined by the Centers for Medicare and Medicaid Services includes, in addition to the face-to-face time of a patient visit (documented in the note above) non-face-to-face time: obtaining and reviewing outside history, ordering and reviewing medications, tests or procedures, care coordination (communications with other health care professionals or caregivers) and documentation in the medical record.

## 2019-06-26 ENCOUNTER — Telehealth: Payer: Self-pay | Admitting: *Deleted

## 2019-06-26 ENCOUNTER — Inpatient Hospital Stay: Payer: Commercial Managed Care - PPO | Attending: Oncology | Admitting: Oncology

## 2019-06-26 ENCOUNTER — Other Ambulatory Visit: Payer: Self-pay

## 2019-06-26 DIAGNOSIS — Z853 Personal history of malignant neoplasm of breast: Secondary | ICD-10-CM | POA: Insufficient documentation

## 2019-06-26 DIAGNOSIS — C50412 Malignant neoplasm of upper-outer quadrant of left female breast: Secondary | ICD-10-CM | POA: Diagnosis present

## 2019-06-26 DIAGNOSIS — Z7952 Long term (current) use of systemic steroids: Secondary | ICD-10-CM | POA: Diagnosis not present

## 2019-06-26 DIAGNOSIS — Z171 Estrogen receptor negative status [ER-]: Secondary | ICD-10-CM | POA: Insufficient documentation

## 2019-06-26 DIAGNOSIS — Z803 Family history of malignant neoplasm of breast: Secondary | ICD-10-CM | POA: Diagnosis not present

## 2019-06-26 DIAGNOSIS — Z5111 Encounter for antineoplastic chemotherapy: Secondary | ICD-10-CM | POA: Insufficient documentation

## 2019-06-26 DIAGNOSIS — Z9079 Acquired absence of other genital organ(s): Secondary | ICD-10-CM

## 2019-06-26 DIAGNOSIS — Z9013 Acquired absence of bilateral breasts and nipples: Secondary | ICD-10-CM | POA: Diagnosis not present

## 2019-06-26 DIAGNOSIS — Z79899 Other long term (current) drug therapy: Secondary | ICD-10-CM | POA: Insufficient documentation

## 2019-06-26 DIAGNOSIS — Z818 Family history of other mental and behavioral disorders: Secondary | ICD-10-CM | POA: Diagnosis not present

## 2019-06-26 DIAGNOSIS — Z1501 Genetic susceptibility to malignant neoplasm of breast: Secondary | ICD-10-CM | POA: Diagnosis not present

## 2019-06-26 DIAGNOSIS — C50211 Malignant neoplasm of upper-inner quadrant of right female breast: Secondary | ICD-10-CM | POA: Insufficient documentation

## 2019-06-26 DIAGNOSIS — R110 Nausea: Secondary | ICD-10-CM | POA: Insufficient documentation

## 2019-06-26 DIAGNOSIS — C773 Secondary and unspecified malignant neoplasm of axilla and upper limb lymph nodes: Secondary | ICD-10-CM | POA: Diagnosis not present

## 2019-06-26 NOTE — Telephone Encounter (Signed)
VM left by the pt's daughter, Caren Griffins , stating need for MD appointment to discuss medication due to holding per primary MD and Dr Jannifer Rodney discussion.  This RN sent appointment request per above to scheduling.

## 2019-06-26 NOTE — Addendum Note (Signed)
Addended by: [name] on: 06/26/2019 06:58 PM   Modules accepted: Orders

## 2019-06-27 ENCOUNTER — Telehealth: Payer: Self-pay | Admitting: Oncology

## 2019-06-27 NOTE — Telephone Encounter (Signed)
Scheduled appts per 5/4 los. Pt confirmed appt date and time.

## 2019-06-28 ENCOUNTER — Encounter: Payer: Self-pay | Admitting: *Deleted

## 2019-06-28 ENCOUNTER — Telehealth: Payer: Self-pay | Admitting: *Deleted

## 2019-06-28 ENCOUNTER — Other Ambulatory Visit: Payer: Self-pay | Admitting: Oncology

## 2019-06-28 ENCOUNTER — Telehealth: Payer: Self-pay | Admitting: Licensed Clinical Social Worker

## 2019-06-28 DIAGNOSIS — C50911 Malignant neoplasm of unspecified site of right female breast: Secondary | ICD-10-CM | POA: Insufficient documentation

## 2019-06-28 DIAGNOSIS — Z1501 Genetic susceptibility to malignant neoplasm of breast: Secondary | ICD-10-CM

## 2019-06-28 NOTE — Telephone Encounter (Signed)
Called pt and provided navigation resources and contact information. Discussed future appts.  Referrals made to financial counseling, SW and Nutrition.

## 2019-06-28 NOTE — Progress Notes (Signed)
START OFF PATHWAY REGIMEN - Breast   [id number]:Nab-Paclitaxel (Abraxane) 100 mg/m2 D1, 8, 15 + Carboplatin AUC=6 D1 q21 Days:   A cycle is every 21 days:     Nab-paclitaxel (protein bound)      Carboplatin   **Always confirm dose/schedule in your pharmacy ordering system**  Patient Characteristics: Distant Metastases or Locoregional Recurrent Disease - Unresected or Locally Advanced Unresectable Disease Progressing after Neoadjuvant and Local Therapies, HER2 Negative/Unknown/Equivocal, ER Negative/Unknown, Chemotherapy, First Line, ER Negative,  PD-L1 Expression Negative/Unknown Therapeutic Status: Locoregional Recurrent Disease - Unresected BRCA Mutation Status: Present (Germline) ER Status: Negative (-) HER2 Status: Negative (-) PR Status: Negative (-) Line of Therapy: First Line PD-L1 Expression Status: Awaiting Test Results Intent of Therapy: Curative Intent, Discussed with Patient

## 2019-06-28 NOTE — Telephone Encounter (Signed)
[place] Clinical Social Work  CSW left VM for patient after receiving referral from Art therapist, [place]. Per RN, this is pt's 2nd cancer dx. She will be starting chemo 5/18 and seems "a little overwhelmed". Patient is also uninsured as of 5/2 but ineligible for [facility] as biopsy already completed.  CSW provided direct number for patient to call back.    Edwinna Areola Jessel Gettinger, LCSW

## 2019-07-02 ENCOUNTER — Encounter: Payer: Self-pay | Admitting: *Deleted

## 2019-07-03 ENCOUNTER — Telehealth: Payer: Self-pay | Admitting: Licensed Clinical Social Worker

## 2019-07-03 NOTE — Telephone Encounter (Signed)
[place] Work  Spoke with patient and introduced self. Patient states she has plenty of support right now and does not need any social work support. Had to rush back to a work call but has this CSW's contact information should she have any needs in the future.  Edwinna Areola Jahanna Raether, LCSW

## 2019-07-04 ENCOUNTER — Other Ambulatory Visit: Payer: Self-pay | Admitting: Oncology

## 2019-07-04 ENCOUNTER — Other Ambulatory Visit: Payer: Self-pay | Admitting: Radiology

## 2019-07-04 ENCOUNTER — Other Ambulatory Visit: Payer: Self-pay | Admitting: *Deleted

## 2019-07-04 DIAGNOSIS — C50211 Malignant neoplasm of upper-inner quadrant of right female breast: Secondary | ICD-10-CM

## 2019-07-04 DIAGNOSIS — C50412 Malignant neoplasm of upper-outer quadrant of left female breast: Secondary | ICD-10-CM

## 2019-07-04 DIAGNOSIS — Z171 Estrogen receptor negative status [ER-]: Secondary | ICD-10-CM

## 2019-07-04 NOTE — Progress Notes (Signed)
Pharmacist Chemotherapy Monitoring - Initial Assessment    Anticipated start date: 07/10/2019    Regimen:  . Are orders appropriate based on the patient's diagnosis, regimen, and cycle? Yes . Does the plan date match the patient's scheduled date? Yes . Is the sequencing of drugs appropriate? Yes . Are the premedications appropriate for the patient's regimen? Yes . Prior Authorization for treatment is: Approved o If applicable, is the correct biosimilar selected based on the patient's insurance? not applicable  Organ Function and Labs: Marland Kitchen Are dose adjustments needed based on the patient's renal function, hepatic function, or hematologic function? Yes . Are appropriate labs ordered prior to the start of patient's treatment? Yes . Other organ system assessment, if indicated: N/A . The following baseline labs, if indicated, have been ordered: N/A  Dose Assessment: . Are the drug doses appropriate? Yes . Are the following correct: o Drug concentrations Yes o IV fluid compatible with drug Yes o Administration routes Yes o Timing of therapy Yes . If applicable, does the patient have documented access for treatment and/or plans for port-a-cath placement? yes . If applicable, have lifetime cumulative doses been properly documented and assessed? no Lifetime Dose Tracking  No doses have been documented on this patient for the following tracked chemicals: Doxorubicin, Epirubicin, Idarubicin, Daunorubicin, Mitoxantrone, Bleomycin, Oxaliplatin, Carboplatin, Liposomal Doxorubicin  o   Toxicity Monitoring/Prevention: . The patient has the following take home antiemetics prescribed: Dexamethasone . The patient has the following take home medications prescribed: N/A . Medication allergies and previous infusion related reactions, if applicable, have been reviewed and addressed. No . The patient's current medication list has been assessed for drug-drug interactions with their chemotherapy regimen. no  significant drug-drug interactions were identified on review.  Order Review: . Are the treatment plan orders signed? No . Is the patient scheduled to see a provider prior to their treatment? Yes  I verify that I have reviewed each item in the above checklist and answered each question accordingly.  Tayra Dawe D 07/04/2019 4:02 PM

## 2019-07-05 ENCOUNTER — Encounter (HOSPITAL_COMMUNITY): Payer: Self-pay

## 2019-07-05 ENCOUNTER — Ambulatory Visit (HOSPITAL_COMMUNITY)
Admission: RE | Admit: 2019-07-05 | Discharge: 2019-07-05 | Disposition: A | Discharge status: Home / Self Care | Payer: Commercial Managed Care - PPO | Source: Ambulatory Visit | Attending: Oncology | Admitting: Oncology

## 2019-07-05 ENCOUNTER — Encounter: Payer: Self-pay | Admitting: Oncology

## 2019-07-05 ENCOUNTER — Other Ambulatory Visit: Payer: Self-pay | Admitting: Oncology

## 2019-07-05 ENCOUNTER — Other Ambulatory Visit: Payer: Self-pay

## 2019-07-05 DIAGNOSIS — Z171 Estrogen receptor negative status [ER-]: Secondary | ICD-10-CM

## 2019-07-05 DIAGNOSIS — C50211 Malignant neoplasm of upper-inner quadrant of right female breast: Secondary | ICD-10-CM | POA: Diagnosis not present

## 2019-07-05 DIAGNOSIS — C50412 Malignant neoplasm of upper-outer quadrant of left female breast: Secondary | ICD-10-CM

## 2019-07-05 DIAGNOSIS — Z9221 Personal history of antineoplastic chemotherapy: Secondary | ICD-10-CM | POA: Insufficient documentation

## 2019-07-05 HISTORY — PX: IR IMAGING GUIDED PORT INSERTION: IMG5740

## 2019-07-05 LAB — BASIC METABOLIC PANEL
Anion gap: 10 (ref 5–15)
BUN: 18 mg/dL (ref 6–20)
CO2: 23 mmol/L (ref 22–32)
Calcium: 9.2 mg/dL (ref 8.9–10.3)
Chloride: 104 mmol/L (ref 98–111)
Creatinine, Ser: 0.9 mg/dL (ref 0.44–1.00)
GFR calc Af Amer: >60 mL/min (ref >60)
GFR calc non Af Amer: >60 mL/min (ref >60)
Glucose, Bld: 103 mg/dL — ABNORMAL HIGH (ref 70–99)
Potassium: 3.8 mmol/L (ref 3.5–5.1)
Sodium: 137 mmol/L (ref 135–145)

## 2019-07-05 LAB — CBC WITH DIFFERENTIAL/PLATELET
Abs Immature Granulocytes: 0.01 10*3/uL (ref 0.00–0.07)
Basophils Absolute: 0 10*3/uL (ref 0.0–0.1)
Basophils Relative: 1 %
Eosinophils Absolute: 0.1 10*3/uL (ref 0.0–0.5)
Eosinophils Relative: 2 %
HCT: 40.2 % (ref 36.0–46.0)
Hemoglobin: 13.4 g/dL (ref 12.0–15.0)
Immature Granulocytes: 0 %
Lymphocytes Relative: 21 %
Lymphs Abs: 1.2 10*3/uL (ref 0.7–4.0)
MCH: 31.2 pg (ref 26.0–34.0)
MCHC: 33.3 g/dL (ref 30.0–36.0)
MCV: 93.5 fL (ref 80.0–100.0)
Monocytes Absolute: 0.3 10*3/uL (ref 0.1–1.0)
Monocytes Relative: 6 %
Neutro Abs: 3.9 10*3/uL (ref 1.7–7.7)
Neutrophils Relative %: 70 %
Platelets: 232 10*3/uL (ref 150–400)
RBC: 4.3 MIL/uL (ref 3.87–5.11)
RDW: 12.5 % (ref 11.5–15.5)
WBC: 5.5 10*3/uL (ref 4.0–10.5)
nRBC: 0 % (ref 0.0–0.2)

## 2019-07-05 LAB — PROTIME-INR
INR: 1 (ref 0.8–1.2)
Prothrombin Time: 12.3 seconds (ref 11.4–15.2)

## 2019-07-05 MED ORDER — CEFAZOLIN SODIUM-DEXTROSE 2-4 GM/100ML-% IV SOLN
INTRAVENOUS | Status: AC
  Filled 2019-07-05: qty 100

## 2019-07-05 MED ORDER — IOHEXOL 300 MG/ML  SOLN
100.0000 mL | Freq: Once | INTRAMUSCULAR | Status: AC | PRN
  Administered 2019-07-05: 100 mL via INTRAVENOUS

## 2019-07-05 MED ORDER — HEPARIN SOD (PORK) LOCK FLUSH 100 UNIT/ML IV SOLN
INTRAVENOUS | Status: AC
  Filled 2019-07-05: qty 5

## 2019-07-05 MED ORDER — LIDOCAINE-EPINEPHRINE 1 %-1:100000 IJ SOLN
INTRAMUSCULAR | Status: AC
  Filled 2019-07-05: qty 1

## 2019-07-05 MED ORDER — FENTANYL CITRATE (PF) 100 MCG/2ML IJ SOLN
INTRAMUSCULAR | Status: AC
  Filled 2019-07-05: qty 2

## 2019-07-05 MED ORDER — SODIUM CHLORIDE 0.9 % IV SOLN: INTRAVENOUS | Status: DC

## 2019-07-05 MED ORDER — LIDOCAINE-EPINEPHRINE 1 %-1:100000 IJ SOLN
INTRAMUSCULAR | Status: AC | PRN
  Administered 2019-07-05 (×2): 10 mL via INTRADERMAL

## 2019-07-05 MED ORDER — FENTANYL CITRATE (PF) 100 MCG/2ML IJ SOLN
INTRAMUSCULAR | Status: AC | PRN
  Administered 2019-07-05 (×2): 50 ug via INTRAVENOUS

## 2019-07-05 MED ORDER — CEFAZOLIN SODIUM-DEXTROSE 2-4 GM/100ML-% IV SOLN
2.0000 g | INTRAVENOUS | Status: AC
  Administered 2019-07-05: 2 g via INTRAVENOUS

## 2019-07-05 MED ORDER — HEPARIN SOD (PORK) LOCK FLUSH 100 UNIT/ML IV SOLN
INTRAVENOUS | Status: AC | PRN
  Administered 2019-07-05: 500 [IU] via INTRAVENOUS

## 2019-07-05 MED ORDER — SODIUM CHLORIDE (PF) 0.9 % IJ SOLN
INTRAMUSCULAR | Status: AC
  Filled 2019-07-05: qty 50

## 2019-07-05 MED ORDER — MIDAZOLAM HCL 2 MG/2ML IJ SOLN
INTRAMUSCULAR | Status: AC
  Filled 2019-07-05: qty 4

## 2019-07-05 MED ORDER — MIDAZOLAM HCL 2 MG/2ML IJ SOLN
INTRAMUSCULAR | Status: AC | PRN
  Administered 2019-07-05 (×4): 1 mg via INTRAVENOUS

## 2019-07-05 NOTE — H&P (Signed)
Chief Complaint: Patient was seen in consultation today for breast cancer/Port-a-cath placement.  Referring Physician(s): [name]  Supervising Physician: Daryll Brod  Patient Status: [facility] - Out-pt  History of Present Illness: Joy Reyes is a 34 y.o. female with a past medical history of breast cancer. She was originally diagnosed with breast cancer in 2012 while living in [place]. She underwent chemotherapy as management and was followed with yearly MRI/mammograms for management. In 05/2019, patient noticed changes in her breast. This area was biopsied 06/19/2019, revealing BRCA+ invasive ductal carcinoma. She was referred to oncology for further management.  IR requested by Dr. Gwenlyn Perking for possible image-guided Port-a-cath placement. Patient awake and alert laying in bed with no complaints at this time. Denies fever, chills, chest pain, dyspnea, abdominal pain, or headache.   Past Medical History:  Diagnosis Date  . Cancer [facility])    breast cancer 2012    Past Surgical History:  Procedure Laterality Date  . BREAST SURGERY     lumpectomy on right breast  . CESAREAN SECTION MULTI-GESTATIONAL N/A 06/01/2015   Procedure: CESAREAN SECTION MULTI-GESTATIONAL;  Surgeon: Waymon Amato, MD;  Location: [facility];  Service: Obstetrics;  Laterality: N/A;  . LOBECTOMY Right 2012  . SALPINGECTOMY      Allergies: Patient has no known allergies.  Medications: Prior to Admission medications   Not on File     History reviewed. No pertinent family history.  Social History   Socioeconomic History  . Marital status: Married    Spouse name: Not on file  . Number of children: Not on file  . Years of education: Not on file  . Highest education level: Not on file  Occupational History  . Not on file  Tobacco Use  . Smoking status: Never Smoker  . Smokeless tobacco: Never Used  Substance and Sexual Activity  . Alcohol use: No  . Drug use: No  . Sexual activity: Yes  Other  Topics Concern  . Not on file  Social History Narrative  . Not on file   Social Determinants of Health   Financial Resource Strain:   . Difficulty of Paying Living Expenses:   Food Insecurity:   . Worried About Charity fundraiser in the Last Year:   . Arboriculturist in the Last Year:   Transportation Needs:   . Film/video editor (Medical):   Marland Kitchen Lack of Transportation (Non-Medical):   Physical Activity:   . Days of Exercise per Week:   . Minutes of Exercise per Session:   Stress:   . Feeling of Stress :   Social Connections:   . Frequency of Communication with Friends and Family:   . Frequency of Social Gatherings with Friends and Family:   . Attends Religious Services:   . Active Member of Clubs or Organizations:   . Attends Archivist Meetings:   Marland Kitchen Marital Status:      Review of Systems: A 12 point ROS discussed and pertinent positives are indicated in the HPI above.  All other systems are negative.  Review of Systems  Constitutional: Negative for chills and fever.  Respiratory: Negative for shortness of breath and wheezing.   Cardiovascular: Negative for chest pain and palpitations.  Gastrointestinal: Negative for abdominal pain.  Neurological: Negative for headaches.  Psychiatric/Behavioral: Negative for behavioral problems and confusion.    Vital Signs: BP 121/69 (BP Location: Right Arm)   Reyes 72   Temp 98.3 F (36.8 C) (Oral)   Resp 18  Ht 5' 8"  (1.727 m)   Wt 145 lb 15.1 oz (66.2 kg)   LMP 06/21/2019   SpO2 100%   BMI 22.19 kg/m   Physical Exam Vitals and nursing note reviewed.  Constitutional:      General: She is not in acute distress.    Appearance: Normal appearance.  Cardiovascular:     Rate and Rhythm: Normal rate and regular rhythm.     Heart sounds: Normal heart sounds. No murmur.  Pulmonary:     Effort: Pulmonary effort is normal. No respiratory distress.     Breath sounds: Normal breath sounds. No wheezing.  Skin:     General: Skin is warm and dry.  Neurological:     Mental Status: She is alert and oriented to person, place, and time.      MD Evaluation Airway: WNL Heart: WNL Abdomen: WNL Chest/ Lungs: WNL ASA  Classification: 3 Mallampati/Airway Score: One   Imaging: No results found.  Labs:  CBC: Recent Labs    07/05/19 1208  WBC 5.5  HGB 13.4  HCT 40.2  PLT 232    COAGS: Recent Labs    07/05/19 1208  INR 1.0    BMP: No results for input(s): NA, K, CL, CO2, GLUCOSE, BUN, CALCIUM, CREATININE, GFRNONAA, GFRAA in the last 8760 hours.  Invalid input(s): CMP   Assessment and Plan:  BRCA+ breast cancer with tentative plans to begin systemic chemotherapy as management. Plan for image-guided Port-a-cath placement today in IR. Patient is NPO. Afebrile.  Risks and benefits of image-guided Port-a-catheter placement were discussed with the patient including, but not limited to bleeding, infection, pneumothorax, or fibrin sheath development and need for additional procedures. All of the patient's questions were answered, patient is agreeable to proceed. Consent signed and in chart.   Thank you for this interesting consult.  I greatly enjoyed meeting Joy Reyes and look forward to participating in their care.  A copy of this report was sent to the requesting provider on this date.  Electronically Signed: Earley Abide, PA-C 07/05/2019, 12:48 PM   I spent a total of 30 Minutes in face to face in clinical consultation, greater than 50% of which was counseling/coordinating care for breast cancer/Port-a-cath placement.

## 2019-07-05 NOTE — Procedures (Signed)
Interventional Radiology Procedure Note  Procedure: RT IJ POWER PORT  Complications: None  Estimated Blood Loss: MIN  Findings: TIP SVCRA       

## 2019-07-05 NOTE — Discharge Instructions (Signed)
Implanted Port Insertion, Care After This sheet gives you information about how to care for yourself after your procedure. Your health care provider may also give you more specific instructions. If you have problems or questions, contact your health care provider. What can I expect after the procedure? After the procedure, it is common to have:  Discomfort at the port insertion site.  Bruising on the skin over the port. This should improve over 3-4 days. Follow these instructions at home: Port care  After your port is placed, you will get a manufacturer's information card. The card has information about your port. Keep this card with you at all times.  Take care of the port as told by your health care provider. Ask your health care provider if you or a family member can get training for taking care of the port at home. A home health care nurse may also take care of the port.  Make sure to remember what type of port you have. Incision care      Follow instructions from your health care provider about how to take care of your port insertion site. Make sure you: ? Wash your hands with soap and water before and after you change your bandage (dressing). If soap and water are not available, use hand sanitizer. ? Change your dressing as told by your health care provider. ? Leave stitches (sutures), skin glue, or adhesive strips in place. These skin closures may need to stay in place for 2 weeks or longer. If adhesive strip edges start to loosen and curl up, you may trim the loose edges. Do not remove adhesive strips completely unless your health care provider tells you to do that.  Check your port insertion site every day for signs of infection. Check for: ? Redness, swelling, or pain. ? Fluid or blood. ? Warmth. ? Pus or a bad smell. Activity  Return to your normal activities as told by your health care provider. Ask your health care provider what activities are safe for you.  Do not  lift anything that is heavier than 10 lb (4.5 kg), or the limit that you are told, until your health care provider says that it is safe. General instructions  Take over-the-counter and prescription medicines only as told by your health care provider.  Do not take baths, swim, or use a hot tub until your health care provider approves. Ask your health care provider if you may take showers. You may only be allowed to take sponge baths.  Do not drive for 24 hours if you were given a sedative during your procedure.  Wear a medical alert bracelet in case of an emergency. This will tell any health care providers that you have a port.  Keep all follow-up visits as told by your health care provider. This is important. Contact a health care provider if:  You cannot flush your port with saline as directed, or you cannot draw blood from the port.  You have a fever or chills.  You have redness, swelling, or pain around your port insertion site.  You have fluid or blood coming from your port insertion site.  Your port insertion site feels warm to the touch.  You have pus or a bad smell coming from the port insertion site. Get help right away if:  You have chest pain or shortness of breath.  You have bleeding from your port that you cannot control. Summary  Take care of the port as told by your health   care provider. Keep the manufacturer's information card with you at all times.  Change your dressing as told by your health care provider.  Contact a health care provider if you have a fever or chills or if you have redness, swelling, or pain around your port insertion site.  Keep all follow-up visits as told by your health care provider. This information is not intended to replace advice given to you by your health care provider. Make sure you discuss any questions you have with your health care provider. Document Revised: 09/06/2017 Document Reviewed: 09/06/2017 Elsevier Patient Education   2020 [employer] Implanted Port Home Guide An implanted port is a device that is placed under the skin. It is usually placed in the chest. The device can be used to give IV medicine, to take blood, or for dialysis. You may have an implanted port if:  You need IV medicine that would be irritating to the small veins in your hands or arms.  You need IV medicines, such as antibiotics, for a long period of time.  You need IV nutrition for a long period of time.  You need dialysis. Having a port means that your health care provider will not need to use the veins in your arms for these procedures. You may have fewer limitations when using a port than you would if you used other types of long-term IVs, and you will likely be able to return to normal activities after your incision heals. An implanted port has two main parts:  Reservoir. The reservoir is the part where a needle is inserted to give medicines or draw blood. The reservoir is round. After it is placed, it appears as a small, raised area under your skin.  Catheter. The catheter is a thin, flexible tube that connects the reservoir to a vein. Medicine that is inserted into the reservoir goes into the catheter and then into the vein. How is my port accessed? To access your port:  A numbing cream may be placed on the skin over the port site.  Your health care provider will put on a mask and sterile gloves.  The skin over your port will be cleaned carefully with a germ-killing soap and allowed to dry.  Your health care provider will gently pinch the port and insert a needle into it.  Your health care provider will check for a blood return to make sure the port is in the vein and is not clogged.  If your port needs to remain accessed to get medicine continuously (constant infusion), your health care provider will place a clear bandage (dressing) over the needle site. The dressing and needle will need to be changed every week, or as told  by your health care provider. What is flushing? Flushing helps keep the port from getting clogged. Follow instructions from your health care provider about how and when to flush the port. Ports are usually flushed with saline solution or a medicine called heparin. The need for flushing will depend on how the port is used:  If the port is only used from time to time to give medicines or draw blood, the port may need to be flushed: ? Before and after medicines have been given. ? Before and after blood has been drawn. ? As part of routine maintenance. Flushing may be recommended every 4-6 weeks.  If a constant infusion is running, the port may not need to be flushed.  Throw away any syringes in a disposal container that is meant   for sharp items (sharps container). You can buy a sharps container from a pharmacy, or you can make one by using an empty hard plastic bottle with a cover. How long will my port stay implanted? The port can stay in for as long as your health care provider thinks it is needed. When it is time for the port to come out, a surgery will be done to remove it. The surgery will be similar to the procedure that was done to put the port in. Follow these instructions at home:   Flush your port as told by your health care provider.  If you need an infusion over several days, follow instructions from your health care provider about how to take care of your port site. Make sure you: ? Wash your hands with soap and water before you change your dressing. If soap and water are not available, use alcohol-based hand sanitizer. ? Change your dressing as told by your health care provider. ? Place any used dressings or infusion bags into a plastic bag. Throw that bag in the trash. ? Keep the dressing that covers the needle clean and dry. Do not get it wet. ? Do not use scissors or sharp objects near the tube. ? Keep the tube clamped, unless it is being used.  Check your port site every day  for signs of infection. Check for: ? Redness, swelling, or pain. ? Fluid or blood. ? Pus or a bad smell.  Protect the skin around the port site. ? Avoid wearing bra straps that rub or irritate the site. ? Protect the skin around your port from seat belts. Place a soft pad over your chest if needed.  Bathe or shower as told by your health care provider. The site may get wet as long as you are not actively receiving an infusion.  Return to your normal activities as told by your health care provider. Ask your health care provider what activities are safe for you.  Carry a medical alert card or wear a medical alert bracelet at all times. This will let health care providers know that you have an implanted port in case of an emergency. Get help right away if:  You have redness, swelling, or pain at the port site.  You have fluid or blood coming from your port site.  You have pus or a bad smell coming from the port site.  You have a fever. Summary  Implanted ports are usually placed in the chest for long-term IV access.  Follow instructions from your health care provider about flushing the port and changing bandages (dressings).  Take care of the area around your port by avoiding clothing that puts pressure on the area, and by watching for signs of infection.  Protect the skin around your port from seat belts. Place a soft pad over your chest if needed.  Get help right away if you have a fever or you have redness, swelling, pain, drainage, or a bad smell at the port site. This information is not intended to replace advice given to you by your health care provider. Make sure you discuss any questions you have with your health care provider. Document Revised: 06/02/2018 Document Reviewed: 03/13/2016 Elsevier Patient Education  2020 [employer]. Moderate Conscious Sedation, Adult, Care After These instructions provide you with information about caring for yourself after your procedure.  Your health care provider may also give you more specific instructions. Your treatment has been planned according to current medical practices, but   problems sometimes occur. Call your health care provider if you have any problems or questions after your procedure. What can I expect after the procedure? After your procedure, it is common:  To feel sleepy for several hours.  To feel clumsy and have poor balance for several hours.  To have poor judgment for several hours.  To vomit if you eat too soon. Follow these instructions at home: For at least 24 hours after the procedure:   Do not: ? Participate in activities where you could fall or become injured. ? Drive. ? Use heavy machinery. ? Drink alcohol. ? Take sleeping pills or medicines that cause drowsiness. ? Make important decisions or sign legal documents. ? Take care of children on your own.  Rest. Eating and drinking  Follow the diet recommended by your health care provider.  If you vomit: ? Drink water, juice, or soup when you can drink without vomiting. ? Make sure you have little or no nausea before eating solid foods. General instructions  Have a responsible adult stay with you until you are awake and alert.  Take over-the-counter and prescription medicines only as told by your health care provider.  If you smoke, do not smoke without supervision.  Keep all follow-up visits as told by your health care provider. This is important. Contact a health care provider if:  You keep feeling nauseous or you keep vomiting.  You feel light-headed.  You develop a rash.  You have a fever. Get help right away if:  You have trouble breathing. This information is not intended to replace advice given to you by your health care provider. Make sure you discuss any questions you have with your health care provider. Document Revised: 01/21/2017 Document Reviewed: 05/31/2015 Elsevier Patient Education  2020 [employer].  

## 2019-07-05 NOTE — Progress Notes (Signed)
[place]  Telephone:([phone]) [phone] Fax:(336) [phone]     ID: Joy Reyes DOB: [birth year]  MR#: [id number]  LKJ#:[id number]  Patient Care Team: System, Pcp Not In as PCP - General Keyarra Rendall, [name], MD as Consulting Physician (Oncology) Fulton Reek, MD as Referring Physician (Obstetrics and Gynecology) [name], RN as Oncology Nurse Navigator [name], RN as Oncology Nurse Navigator [name], MD OTHER MD: PCP= [name] MD  CHIEF COMPLAINT: BRCA positive breast cancer  CURRENT TREATMENT: Neoadjuvant chemotherapy   INTERVAL HISTORY: Joy Reyes" returns today for follow up of her BRCA positive breast cancer, accompanied by her husband Joy Reyes. She was evaluated in the breast cancer clinic on 06/26/2019.  Since consultation, she underwent chest, abdomen, pelvis CT yesterday, 07/05/2019, showing: heterogeneous enhancement with nodular features in left breast and small left axillary lymph nodes; no evidence of metastatic disease in abdomen or pelvis.  She also underwent port placement yesterday.  She is scheduled for breast MRI later today and for bone scan on 07/13/2019.   REVIEW OF SYSTEMS: Joy Reyes tolerated the port well. She used one percocet, then tylenol. She continues to work full time but is planning to take time off during her intense chemotherapy and surgery. She already met with the chemo teaching nurse and "learned a lot." it helps that she had chemotherapy previously. Today Tony's oldest son (Joy Reyes's stepson) is getting married!   HISTORY OF CURRENT ILLNESS: From the original intake note:  Jaleisa Brose has a history of breast cancer, diagnosed in 2012 while she lived in [place], [place], and treated in [place] under Dr [name].  We are obtaining data from that experience but according to the patient her tumor was the size of an apricot.  She did not have axillary lymph node sampling or radiation.  She remembers 8  neoadjuvant chemotherapy treatments given every 2 weeks with the first including the "red drug", which suggest Cytoxan and Adriamycin in dose dense fashion x4 most likely followed by either Taxol or Taxotere in dose dense fashion x4.  After that experience and given her breast density and BRCA mutation our practice here would have been a yearly follow-up with MRI in addition to yearly mammography.   More recently Joy Reyes herself palpated a change in her right breast.  She brought it to Dr. [name] attention and underwent bilateral diagnostic mammography with tomography and left breast ultrasonography at [facility] on 06/14/2019 showing: breast density category D; there was an area of asymmetry in the upper outer quadrant of the left breast, and ultrasound of that area showed a 2.8 cm mass.  Ultrasound of the axilla showed 1.8 cm lymph node with cortical thickening.    In the right breast they saw only postsurgical changes, with 2 biopsy clips in the retroareolar area.  Accordingly on 06/19/2019 she proceeded to biopsy of the left breast area in question. 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  The left axillary lymph node biopsy was nondiagnostic, with very spare sampling of fibroadipose and lymphoid tissue.  Estrogen receptor was negative, with less than 1% uptake, progesterone receptor was negative, with less than 1% uptake, HER-2 was negative by immunohistochemistry with a score of 1+.  The patient's subsequent history is as detailed below.   PAST MEDICAL HISTORY: Past Medical History:  Diagnosis Date  . Cancer [facility])    breast cancer 2012    PAST SURGICAL HISTORY: Past  Surgical History:  Procedure Laterality Date  . BREAST SURGERY     lumpectomy on right breast  . CESAREAN SECTION MULTI-GESTATIONAL N/A 06/01/2015   Procedure: CESAREAN SECTION MULTI-GESTATIONAL;  Surgeon: Waymon Amato, MD;  Location: [place]  ORS;  Service: Obstetrics;  Laterality: N/A;  . IR IMAGING GUIDED PORT INSERTION  07/05/2019  . LOBECTOMY Right 2012  . SALPINGECTOMY      FAMILY HISTORY: Family History  Problem Relation Age of Onset  . Breast cancer Maternal Grandmother        very late in life, lived to 46  . Breast cancer Paternal Grandmother        died at age 44  . Alzheimer's disease Paternal Grandfather     The patient's parents are both turning 17 in 2021.  The paternal grandfather died from Alzheimer's disease.  The paternal grandmother died from breast cancer at the age of 69.  The patient's father had 1 sister who had died as a child from meningitis.  He had no brothers.  On the mother side, a maternal grandmother did have breast cancer very late in life, living to be 6.  The paternal grandfather and the only maternal aunt had no history of cancer.  The patient has 1 brother, in good health, no sisters.   GYNECOLOGIC HISTORY:  Patient's last menstrual period was 06/21/2019. Menarche: 34 years old Age at first live birth: 34 years old Al[place] P twins LMP regular as of May 2021 Contraceptive HRT no  Hysterectomy?  No BSO?  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  Her husband [name]") works for an NGO (the [employer]) out of [place] which helps small agencies doing research set up and succeed.  Their twins are 33 years old. Joy Reyes has (2?) children from a prior marriage. The patient belongs to an orthodox denomination    ADVANCED DIRECTIVES: In the absence of any documentation to the contrary, the patient's spouse is their HCPOA.    HEALTH MAINTENANCE: Social History   Tobacco Use  . Smoking status: Never Smoker  . Smokeless tobacco: Never Used  Substance Use Topics  . Alcohol use: No  . Drug use: No     Colonoscopy: n/a (age)  PAP: Up-to-date  Bone density: n/a (age)   No Known  Allergies  Current Outpatient Medications  Medication Sig Dispense Refill  . dexamethasone (DECADRON) 4 MG tablet Take 2 tablets (8 mg total) by mouth daily. Start the day after carboplatin chemotherapy (day 1 of each 21 days cycle) and continue for 2 days (days 2 and 3, counting chemotherapy day as day 1);  last dose the morning of day 4. 30 tablet 1  . lidocaine-prilocaine (EMLA) cream Apply to affected area once 30 g 3  . LORazepam (ATIVAN) 0.5 MG tablet Take 1 tablet (0.5 mg total) by mouth at bedtime as needed (Nausea or vomiting). 20 tablet 0  . prochlorperazine (COMPAZINE) 10 MG tablet Take 1 tablet (10 mg total) by mouth every 6 (six) hours as needed (Nausea or vomiting). 30 tablet 1   No current facility-administered medications for this visit.    OBJECTIVE: White woman who appears well  Vitals:   07/06/19 0941  BP: 123/71  Pulse: 71  Resp: 20  Temp: 98.1 F (36.7 C)  SpO2: 100%     Body mass index is 21.99 kg/m.   Wt Readings from Last 3 Encounters:  07/06/19  144 lb 9.6 oz (65.6 kg)  07/05/19 145 lb 15.1 oz (66.2 kg)  06/26/19 146 lb (66.2 kg)      ECOG FS:1 - Symptomatic but completely ambulatory  Sclerae unicteric, EOMs intact Wearing a mask No cervical or supraclavicular adenopathy Lungs no rales or rhonchi Heart regular rate and rhythm Abd soft, nontender, positive bowel sounds MSK no focal spinal tenderness, no upper extremity lymphedema Neuro: nonfocal, well oriented, appropriate affect Breasts: There are no suspicious masses or nipple retraction in the right breast.  There is an upper inner quadrant scar.  The right axilla is benign.  There is an easily palpable mass in the upper outer quadrant of the left breast, hard in consistency, esily movable and measuring about 2 cm by palpation, without overlying skin involvement or nipple retraction.  I do not palpate a mass in the left axilla    LAB RESULTS:  CMP obtained 05/30/2019 through [facility] showed a  glucose of 95 BUN 8 creatinine 0.68, sodium 141 potassium 4.6 chloride 105, CO2 21, calcium 9.8, total protein 7.2 albumin 4.9 bilirubin 0.5 alkaline phosphatase 37 AST 21 ALT 17 cholesterol 218 triglycerides 107 HDL 78 and LDL 121.  CA-125 was 34.8.     Component Value Date/Time   NA 137 07/05/2019 1208   K 3.8 07/05/2019 1208   CL 104 07/05/2019 1208   CO2 23 07/05/2019 1208   GLUCOSE 103 (H) 07/05/2019 1208   BUN 18 07/05/2019 1208   CREATININE 0.90 07/05/2019 1208   CALCIUM 9.2 07/05/2019 1208   GFRNONAA >60 07/05/2019 1208   GFRAA >60 07/05/2019 1208    No results found for: TOTALPROTELP, ALBUMINELP, A1GS, A2GS, BETS, BETA2SER, GAMS, MSPIKE, SPEI  Lab Results  Component Value Date   WBC 5.5 07/05/2019   NEUTROABS 3.9 07/05/2019   HGB 13.4 07/05/2019   HCT 40.2 07/05/2019   MCV 93.5 07/05/2019   PLT 232 07/05/2019    No results found for: LABCA2  No components found for: [id number]  Recent Labs  Lab 07/05/19 1208  INR 1.0    No results found for: LABCA2  No results found for: PPI951  No results found for: OAC166  No results found for: AYT016  No results found for: CA2729  No components found for: HGQUANT  No results found for: CEA1 / No results found for: CEA1   No results found for: AFPTUMOR  No results found for: CHROMOGRNA  No results found for: KPAFRELGTCHN, LAMBDASER, KAPLAMBRATIO (kappa/lambda light chains)  No results found for: HGBA, HGBA2QUANT, HGBFQUANT, HGBSQUAN (Hemoglobinopathy evaluation)   No results found for: LDH  No results found for: IRON, TIBC, IRONPCTSAT (Iron and TIBC)  No results found for: FERRITIN  Urinalysis No results found for: COLORURINE, APPEARANCEUR, LABSPEC, PHURINE, GLUCOSEU, HGBUR, BILIRUBINUR, KETONESUR, PROTEINUR, UROBILINOGEN, NITRITE, LEUKOCYTESUR   STUDIES: CT Chest W Contrast  Result Date: 07/05/2019 CLINICAL DATA:  Breast cancer staging. EXAM: CT CHEST, ABDOMEN, AND PELVIS WITH CONTRAST TECHNIQUE:  Multidetector CT imaging of the chest, abdomen and pelvis was performed following the standard protocol during bolus administration of intravenous contrast. CONTRAST:  168m OMNIPAQUE IOHEXOL 300 MG/ML  SOLN COMPARISON:  None FINDINGS: CT CHEST FINDINGS Cardiovascular: Normal caliber thoracic aorta. Normal heart size. No pericardial effusion. No significant atherosclerotic changes. Central pulmonary vessels are unremarkable. Mediastinum/Nodes: No thoracic inlet or axillary lymphadenopathy. The clips likely from prior biopsy in the LEFT axilla and in the LEFT and RIGHT breast. Small lymph nodes along the margin of the LEFT pectoralis muscle (image 22,  series 2) 7 mm. Similar small lymph nodes in the LEFT axilla. No subpectoral adenopathy. No supraclavicular adenopathy Lungs/Pleura: Airways are patent. Lungs are clear. Musculoskeletal: Heterogeneous enhancement of the LEFT breast near biopsy clips measuring 19 mm (image 26, series 2. No internal mammary adenopathy or definable chest wall mass aside from the enhancement in the LEFT breast. Smaller areas of enhancement adjacent to this focus. This area is adjacent to a presumed biopsy clip in the central portion of the upper LEFT breast. CT ABDOMEN PELVIS FINDINGS Hepatobiliary: Liver is normal. Gallbladder is normal. No biliary ductal dilation. No focal hepatic mass. Pancreas: Pancreas is normal without focal lesion or ductal dilation. No peripancreatic inflammation. Spleen: Spleen is normal size without focal lesion. Adrenals/Urinary Tract: Adrenal glands are normal. Renal enhancement is smooth. No hydronephrosis. Urinary bladder moderately distended, unremarkable by CT. Stomach/Bowel: No acute gastrointestinal process. The appendix is normal. Stool fills much of the colon. Mixed with positive contrast. No pericolonic stranding. Vascular/Lymphatic: Normal caliber abdominal aorta. No adenopathy in the retroperitoneum. No retrocrural adenopathy. No upper abdominal  adenopathy. No visible mesenteric lymphadenopathy. No pelvic lymphadenopathy. Reproductive: Corpus luteum cyst in the RIGHT adnexa. CT appearance of pelvic viscera otherwise unremarkable. Other: No abdominal wall hernia or abnormality. No abdominopelvic ascites. Musculoskeletal: No acute bone finding. No destructive bone process. IMPRESSION: 1. Heterogeneous enhancement with nodular features in the LEFT breast and small LEFT axillary lymph nodes, less than a cm as described. 2. No evidence of metastatic disease in the abdomen or pelvis. Electronically Signed   By: Zetta Bills M.D.   On: 07/05/2019 15:38   CT Abdomen Pelvis W Contrast  Result Date: 07/05/2019 CLINICAL DATA:  Breast cancer staging. EXAM: CT CHEST, ABDOMEN, AND PELVIS WITH CONTRAST TECHNIQUE: Multidetector CT imaging of the chest, abdomen and pelvis was performed following the standard protocol during bolus administration of intravenous contrast. CONTRAST:  118m OMNIPAQUE IOHEXOL 300 MG/ML  SOLN COMPARISON:  None FINDINGS: CT CHEST FINDINGS Cardiovascular: Normal caliber thoracic aorta. Normal heart size. No pericardial effusion. No significant atherosclerotic changes. Central pulmonary vessels are unremarkable. Mediastinum/Nodes: No thoracic inlet or axillary lymphadenopathy. The clips likely from prior biopsy in the LEFT axilla and in the LEFT and RIGHT breast. Small lymph nodes along the margin of the LEFT pectoralis muscle (image 22, series 2) 7 mm. Similar small lymph nodes in the LEFT axilla. No subpectoral adenopathy. No supraclavicular adenopathy Lungs/Pleura: Airways are patent. Lungs are clear. Musculoskeletal: Heterogeneous enhancement of the LEFT breast near biopsy clips measuring 19 mm (image 26, series 2. No internal mammary adenopathy or definable chest wall mass aside from the enhancement in the LEFT breast. Smaller areas of enhancement adjacent to this focus. This area is adjacent to a presumed biopsy clip in the central  portion of the upper LEFT breast. CT ABDOMEN PELVIS FINDINGS Hepatobiliary: Liver is normal. Gallbladder is normal. No biliary ductal dilation. No focal hepatic mass. Pancreas: Pancreas is normal without focal lesion or ductal dilation. No peripancreatic inflammation. Spleen: Spleen is normal size without focal lesion. Adrenals/Urinary Tract: Adrenal glands are normal. Renal enhancement is smooth. No hydronephrosis. Urinary bladder moderately distended, unremarkable by CT. Stomach/Bowel: No acute gastrointestinal process. The appendix is normal. Stool fills much of the colon. Mixed with positive contrast. No pericolonic stranding. Vascular/Lymphatic: Normal caliber abdominal aorta. No adenopathy in the retroperitoneum. No retrocrural adenopathy. No upper abdominal adenopathy. No visible mesenteric lymphadenopathy. No pelvic lymphadenopathy. Reproductive: Corpus luteum cyst in the RIGHT adnexa. CT appearance of pelvic viscera otherwise unremarkable. Other:  No abdominal wall hernia or abnormality. No abdominopelvic ascites. Musculoskeletal: No acute bone finding. No destructive bone process. IMPRESSION: 1. Heterogeneous enhancement with nodular features in the LEFT breast and small LEFT axillary lymph nodes, less than a cm as described. 2. No evidence of metastatic disease in the abdomen or pelvis. Electronically Signed   By: Zetta Bills M.D.   On: 07/05/2019 15:38   IR IMAGING GUIDED PORT INSERTION  Result Date: 07/05/2019 CLINICAL DATA:  Left breast cancer EXAM: RIGHT INTERNAL JUGULAR SINGLE LUMEN POWER PORT CATHETER INSERTION Date:  07/05/2019 07/05/2019 2:37 pm Radiologist:  M. Daryll Brod, MD Guidance:  Ultrasound and fluoroscopic MEDICATIONS: Ancef 2 g; The antibiotic was administered within an appropriate time interval prior to skin puncture. ANESTHESIA/SEDATION: Versed 4.0 mg IV; Fentanyl 100 mcg IV; Moderate Sedation Time:  33 minutes The patient was continuously monitored during the procedure by the  interventional radiology nurse under my direct supervision. FLUOROSCOPY TIME:  One minutes, 0 seconds (3 mGy) COMPLICATIONS: None immediate. CONTRAST:  None. PROCEDURE: Informed consent was obtained from the patient following explanation of the procedure, risks, benefits and alternatives. The patient understands, agrees and consents for the procedure. All questions were addressed. A time out was performed. Maximal barrier sterile technique utilized including caps, mask, sterile gowns, sterile gloves, large sterile drape, hand hygiene, and 2% chlorhexidine scrub. Under sterile conditions and local anesthesia, right internal jugular micropuncture venous access was performed. Access was performed with ultrasound. Images were obtained for documentation. A guide wire was inserted followed by a transitional dilator. This allowed insertion of a guide wire and catheter into the IVC. Measurements were obtained from the SVC / RA junction back to the right IJ venotomy site. In the right infraclavicular chest, a subcutaneous pocket was created over the second anterior rib. This was done under sterile conditions and local anesthesia. 1% lidocaine with epinephrine was utilized for this. A 2.5 cm incision was made in the skin. Blunt dissection was performed to create a subcutaneous pocket over the right pectoralis major muscle. The pocket was flushed with saline vigorously. There was adequate hemostasis. The port catheter was assembled and checked for leakage. The port catheter was secured in the pocket with two retention sutures. The tubing was tunneled subcutaneously to the right venotomy site and inserted into the SVC/RA junction through a valved peel-away sheath. Position was confirmed with fluoroscopy. Images were obtained for documentation. The patient tolerated the procedure well. No immediate complications. Incisions were closed in a two layer fashion with 4 - 0 Vicryl suture. Dermabond was applied to the skin. The port  catheter was accessed, blood was aspirated followed by saline and heparin flushes. Needle was removed. A dry sterile dressing was applied. IMPRESSION: Ultrasound and fluoroscopically guided right internal jugular single lumen power port catheter insertion. Tip in the SVC/RA junction. Catheter ready for use. Electronically Signed   By: Jerilynn Mages.  Shick M.D.   On: 07/05/2019 14:55    ELIGIBLE FOR AVAILABLE RESEARCH PROTOCOL: E9528?  ASSESSMENT: 34 y.o. BRCA positive [facility] woman  (0) BRCA positivity:  (a) s/p bilateral salpingectomy  (b) pelvic ultrasound including transvaginal study 06/01/2019 unremarkable, as was CA 125  (c) planning on bilateral mastectomies  (d) bilateral salpingo-oophorectomy age 60  (1) Status post right lumpectomy 2012 for a [        ]  (a) received neoadjuvant chemotherapy consisting of 4 cycles of doxorubicin and cyclophosphamide, followed by paclitaxel x1 (developed anaphylaxis with the first dose), then 4 doses of docetaxel  completed August 2012  (b) [no adjuvant radiation]  (2) Status post left breast upper outer quadrant biopsy 06/19/2019 for a clinical T2 N1, stage IIIB invasive ductal carcinoma, grade 3, triple negative  (a) left axillary lymph node biopsy nondiagnostic [discordant]  (b) chest abdomen and pelvis CT scan 07/05/2019 shows small left axillary lymph nodes, 1.5 cm enhancement in the left breast, no internal mammary adenopathy, no evidence of metastatic disease  (c) bone scan  (3) neoadjuvant chemotherapy consisting of carboplatin day 1, Abraxane days 1,8,15, repeated Q21 days x 4 cycles, starting 07/10/2019  (4) definitive surgery to follow  (5) adjuvant radiation as appropriate  (6) consider adjuvant pembrolizumab or PARP inhibitor   PLAN: Joy Reyes has had her port placement, major scans and met with the chemotherapy teaching nurse. She has a breast MRI pending and bone scan to follow. At this point, there is no evidence ofr metastatic disease and  we are treating for cure.  I gave her a copy of the routing sheet on how to take her supportive medications, and entered the orders to her pharmacy. She had questions re using glutamine, which is fine, vitamin infusions which is not, and benadryl for sleep, which is aso fine. She prefers ice to EMLA cream for port access, also not an issue. I did urger her to take her nausea medicines and Claritin exactly as prescribed this first round, and to keep a symptom diary of her first week. If she has no nausea, as I hope, we can ease back some of the supportive meds.  She understands for the Abraxane all she will need is compazine, possibly not even that.  We had a preliminary discussion regarding her BRCA mutation. She is planning on bilateral mastectomies after chemo, and I am referring her to Dr [name] for a preliminary consultation. We discussed bilateral oophorectomy and she understands there is no validated screening for ovarian cancer. She may want to proceed to ovarian surgery age 10 or so. In the meantime though she is done with childbearing she would like to remain premenopausal and we are starting Lupron next week (she had this in 2012 with her earlier chemo).  I will see her at the time of her 2d Abraxane dose to troubleshoot side-effects and review results of her pending studies.  Total encounter time 40 minutes.    [name]. Shontavia Mickel, MD 07/06/2019 5:41 PM Medical Oncology and Hematology [facility] [place], [place] [address] Tel. [phone]    Fax. [phone]   This document serves as a record of services personally performed by Lurline Del, MD. It was created on his behalf by Wilburn Mylar, a trained medical scribe. The creation of this record is based on the scribe's personal observations and the provider's statements to them.   I, Lurline Del MD, have reviewed the above documentation for accuracy and completeness, and I agree with the  above.   *Total Encounter Time as defined by the Centers for Medicare and Medicaid Services includes, in addition to the face-to-face time of a patient visit (documented in the note above) non-face-to-face time: obtaining and reviewing outside history, ordering and reviewing medications, tests or procedures, care coordination (communications with other health care professionals or caregivers) and documentation in the medical record.

## 2019-07-06 ENCOUNTER — Other Ambulatory Visit: Payer: Self-pay

## 2019-07-06 ENCOUNTER — Inpatient Hospital Stay: Payer: Commercial Managed Care - PPO

## 2019-07-06 ENCOUNTER — Telehealth: Payer: Self-pay | Admitting: *Deleted

## 2019-07-06 ENCOUNTER — Ambulatory Visit
Admission: RE | Admit: 2019-07-06 | Discharge: 2019-07-06 | Disposition: A | Discharge status: Home / Self Care | Payer: Commercial Managed Care - PPO | Source: Ambulatory Visit | Attending: Oncology | Admitting: Oncology

## 2019-07-06 ENCOUNTER — Encounter: Payer: Self-pay | Admitting: Oncology

## 2019-07-06 ENCOUNTER — Encounter: Payer: Self-pay | Admitting: *Deleted

## 2019-07-06 ENCOUNTER — Inpatient Hospital Stay (HOSPITAL_BASED_OUTPATIENT_CLINIC_OR_DEPARTMENT_OTHER): Payer: Commercial Managed Care - PPO | Admitting: Oncology

## 2019-07-06 VITALS — BP 123/71 | HR 71 | Temp 98.1°F | Resp 20 | Ht 68.0 in | Wt 144.6 lb

## 2019-07-06 DIAGNOSIS — C50911 Malignant neoplasm of unspecified site of right female breast: Secondary | ICD-10-CM | POA: Diagnosis not present

## 2019-07-06 DIAGNOSIS — C50412 Malignant neoplasm of upper-outer quadrant of left female breast: Secondary | ICD-10-CM

## 2019-07-06 DIAGNOSIS — Z5111 Encounter for antineoplastic chemotherapy: Secondary | ICD-10-CM | POA: Diagnosis not present

## 2019-07-06 DIAGNOSIS — Z1501 Genetic susceptibility to malignant neoplasm of breast: Secondary | ICD-10-CM

## 2019-07-06 DIAGNOSIS — C50211 Malignant neoplasm of upper-inner quadrant of right female breast: Secondary | ICD-10-CM | POA: Diagnosis not present

## 2019-07-06 DIAGNOSIS — Z171 Estrogen receptor negative status [ER-]: Secondary | ICD-10-CM

## 2019-07-06 LAB — SURGICAL PATHOLOGY

## 2019-07-06 MED ORDER — GADOBUTROL 1 MMOL/ML IV SOLN
6.0000 mL | Freq: Once | INTRAVENOUS | Status: AC | PRN
  Administered 2019-07-06: 6 mL via INTRAVENOUS

## 2019-07-06 MED ORDER — DEXAMETHASONE 4 MG PO TABS: 8.0000 mg | ORAL_TABLET | Freq: Every day | ORAL | 1 refills | Status: DC

## 2019-07-06 MED ORDER — LORAZEPAM 0.5 MG PO TABS: 0.5000 mg | ORAL_TABLET | Freq: Every evening | ORAL | 0 refills | Status: DC | PRN

## 2019-07-06 MED ORDER — LIDOCAINE-PRILOCAINE 2.5-2.5 % EX CREA: TOPICAL_CREAM | CUTANEOUS | 3 refills | Status: DC

## 2019-07-06 MED ORDER — PROCHLORPERAZINE MALEATE 10 MG PO TABS: 10.0000 mg | ORAL_TABLET | Freq: Four times a day (QID) | ORAL | 1 refills | Status: DC | PRN

## 2019-07-06 NOTE — Progress Notes (Signed)
Called pt to introduce myself as her Arboriculturist, discuss copay assistance and the [employer].  Left a msg requesting she return my call if she would like to apply for the grants.

## 2019-07-06 NOTE — Telephone Encounter (Signed)
Called pt with CT scan results. Informed pt that [facility] will call and schedule left axillary US/bx after MRI today to add an extra bx that are needed. Received verbal understanding. Denies further questions or needs at this time

## 2019-07-09 ENCOUNTER — Other Ambulatory Visit: Payer: Self-pay | Admitting: Oncology

## 2019-07-09 ENCOUNTER — Encounter: Payer: Self-pay | Admitting: *Deleted

## 2019-07-09 ENCOUNTER — Telehealth: Payer: Self-pay | Admitting: Oncology

## 2019-07-09 ENCOUNTER — Telehealth: Payer: Self-pay | Admitting: *Deleted

## 2019-07-09 ENCOUNTER — Other Ambulatory Visit: Payer: Self-pay | Admitting: *Deleted

## 2019-07-09 DIAGNOSIS — R928 Other abnormal and inconclusive findings on diagnostic imaging of breast: Secondary | ICD-10-CM

## 2019-07-09 NOTE — Telephone Encounter (Signed)
Pt called and left vm stating she has decided to forgo Lupron injections. Dr. Jana Hakim notified.

## 2019-07-09 NOTE — Telephone Encounter (Signed)
Received call from Dr. Jimmye Norman with recommendations from breast MRI. Called pt and discussed results of needing to bx left and right breast. Informed pt since she will be having bil mastectomy she does not need to have left breast bx, but will need to have right breast bx to determine if LN will need to be sampled at time of sx. Orders placed for MR bx to right breast. Received verbal understanding from pt on recommendations.

## 2019-07-09 NOTE — Telephone Encounter (Signed)
Scheduled appts per 5/14 los. Cancelled injections that were made per 5/14 los because pt had decided against those injections. Pt confirmed appt dates and times.

## 2019-07-10 ENCOUNTER — Inpatient Hospital Stay: Payer: Commercial Managed Care - PPO

## 2019-07-10 ENCOUNTER — Encounter: Payer: Self-pay | Admitting: *Deleted

## 2019-07-10 ENCOUNTER — Other Ambulatory Visit: Payer: Self-pay

## 2019-07-10 VITALS — BP 116/63 | HR 68 | Temp 98.2°F | Resp 20 | Wt 142.1 lb

## 2019-07-10 DIAGNOSIS — Z95828 Presence of other vascular implants and grafts: Secondary | ICD-10-CM

## 2019-07-10 DIAGNOSIS — C50412 Malignant neoplasm of upper-outer quadrant of left female breast: Secondary | ICD-10-CM

## 2019-07-10 DIAGNOSIS — Z5111 Encounter for antineoplastic chemotherapy: Secondary | ICD-10-CM | POA: Diagnosis not present

## 2019-07-10 DIAGNOSIS — C50211 Malignant neoplasm of upper-inner quadrant of right female breast: Secondary | ICD-10-CM

## 2019-07-10 DIAGNOSIS — Z171 Estrogen receptor negative status [ER-]: Secondary | ICD-10-CM

## 2019-07-10 LAB — COMPREHENSIVE METABOLIC PANEL
ALT: 23 U/L (ref 0–44)
AST: 19 U/L (ref 15–41)
Albumin: 4.4 g/dL (ref 3.5–5.0)
Alkaline Phosphatase: 33 U/L — ABNORMAL LOW (ref 38–126)
Anion gap: 17 — ABNORMAL HIGH (ref 5–15)
BUN: 13 mg/dL (ref 6–20)
CO2: 17 mmol/L — ABNORMAL LOW (ref 22–32)
Calcium: 9.7 mg/dL (ref 8.9–10.3)
Chloride: 101 mmol/L (ref 98–111)
Creatinine, Ser: 0.8 mg/dL (ref 0.44–1.00)
GFR calc Af Amer: >60 mL/min (ref >60)
GFR calc non Af Amer: >60 mL/min (ref >60)
Glucose, Bld: 74 mg/dL (ref 70–99)
Potassium: 4.8 mmol/L (ref 3.5–5.1)
Sodium: 135 mmol/L (ref 135–145)
Total Bilirubin: 0.7 mg/dL (ref 0.3–1.2)
Total Protein: 7.5 g/dL (ref 6.5–8.1)

## 2019-07-10 LAB — CBC WITH DIFFERENTIAL/PLATELET
Abs Immature Granulocytes: 0.01 10*3/uL (ref 0.00–0.07)
Basophils Absolute: 0 10*3/uL (ref 0.0–0.1)
Basophils Relative: 1 %
Eosinophils Absolute: 0 10*3/uL (ref 0.0–0.5)
Eosinophils Relative: 0 %
HCT: 38.9 % (ref 36.0–46.0)
Hemoglobin: 13 g/dL (ref 12.0–15.0)
Immature Granulocytes: 0 %
Lymphocytes Relative: 19 %
Lymphs Abs: 1.1 10*3/uL (ref 0.7–4.0)
MCH: 30.7 pg (ref 26.0–34.0)
MCHC: 33.4 g/dL (ref 30.0–36.0)
MCV: 92 fL (ref 80.0–100.0)
Monocytes Absolute: 0.3 10*3/uL (ref 0.1–1.0)
Monocytes Relative: 5 %
Neutro Abs: 4.3 10*3/uL (ref 1.7–7.7)
Neutrophils Relative %: 75 %
Platelets: 251 10*3/uL (ref 150–400)
RBC: 4.23 MIL/uL (ref 3.87–5.11)
RDW: 12.4 % (ref 11.5–15.5)
WBC: 5.7 10*3/uL (ref 4.0–10.5)
nRBC: 0 % (ref 0.0–0.2)

## 2019-07-10 MED ORDER — HEPARIN SOD (PORK) LOCK FLUSH 100 UNIT/ML IV SOLN
500.0000 [IU] | Freq: Once | INTRAVENOUS | Status: AC | PRN
  Administered 2019-07-10: 500 [IU]
  Filled 2019-07-10: qty 5

## 2019-07-10 MED ORDER — SODIUM CHLORIDE 0.9% FLUSH
10.0000 mL | INTRAVENOUS | Status: DC | PRN
  Administered 2019-07-10: 10 mL
  Filled 2019-07-10: qty 10

## 2019-07-10 MED ORDER — SODIUM CHLORIDE 0.9 % IV SOLN
10.0000 mg | Freq: Once | INTRAVENOUS | Status: AC
  Administered 2019-07-10: 10 mg via INTRAVENOUS
  Filled 2019-07-10: qty 10

## 2019-07-10 MED ORDER — SODIUM CHLORIDE 0.9% FLUSH
10.0000 mL | Freq: Once | INTRAVENOUS | Status: AC
  Administered 2019-07-10: 10 mL via INTRAVENOUS
  Filled 2019-07-10: qty 10

## 2019-07-10 MED ORDER — SODIUM CHLORIDE 0.9 % IV SOLN
150.0000 mg | Freq: Once | INTRAVENOUS | Status: AC
  Administered 2019-07-10: 150 mg via INTRAVENOUS
  Filled 2019-07-10: qty 150

## 2019-07-10 MED ORDER — PALONOSETRON HCL INJECTION 0.25 MG/5ML
INTRAVENOUS | Status: AC
  Filled 2019-07-10: qty 5

## 2019-07-10 MED ORDER — SODIUM CHLORIDE 0.9 % IV SOLN
Freq: Once | INTRAVENOUS | Status: AC
  Filled 2019-07-10: qty 250

## 2019-07-10 MED ORDER — PALONOSETRON HCL INJECTION 0.25 MG/5ML
0.2500 mg | Freq: Once | INTRAVENOUS | Status: AC
  Administered 2019-07-10: 0.25 mg via INTRAVENOUS

## 2019-07-10 MED ORDER — PACLITAXEL PROTEIN-BOUND CHEMO INJECTION 100 MG
100.0000 mg/m2 | Freq: Once | INTRAVENOUS | Status: AC
  Administered 2019-07-10: 175 mg via INTRAVENOUS
  Filled 2019-07-10: qty 35

## 2019-07-10 MED ORDER — SODIUM CHLORIDE 0.9 % IV SOLN
594.0000 mg | Freq: Once | INTRAVENOUS | Status: AC
  Administered 2019-07-10: 590 mg via INTRAVENOUS
  Filled 2019-07-10: qty 59

## 2019-07-10 NOTE — Patient Instructions (Signed)
[facility] Discharge Instructions for Patients Receiving Chemotherapy  Today you received the following chemotherapy agents: abraxane and carboplatin.  To help prevent nausea and vomiting after your treatment, we encourage you to take your nausea medication as directed.   If you develop nausea and vomiting that is not controlled by your nausea medication, call the clinic.   BELOW ARE SYMPTOMS THAT SHOULD BE REPORTED IMMEDIATELY:  *FEVER GREATER THAN 100.5 F  *CHILLS WITH OR WITHOUT FEVER  NAUSEA AND VOMITING THAT IS NOT CONTROLLED WITH YOUR NAUSEA MEDICATION  *UNUSUAL SHORTNESS OF BREATH  *UNUSUAL BRUISING OR BLEEDING  TENDERNESS IN MOUTH AND THROAT WITH OR WITHOUT PRESENCE OF ULCERS  *URINARY PROBLEMS  *BOWEL PROBLEMS  UNUSUAL RASH Items with * indicate a potential emergency and should be followed up as soon as possible.  Feel free to call the clinic should you have any questions or concerns. The clinic phone number is [phone].  Please show the CHEMO ALERT CARD at check-in to the Emergency Department and triage nurse.  Nanoparticle Albumin-Bound Paclitaxel injection What is this medicine? NANOPARTICLE ALBUMIN-BOUND PACLITAXEL (Na no PAHR ti kuhl al BYOO muhn-bound PAK li TAX el) is a chemotherapy drug. It targets fast dividing cells, like cancer cells, and causes these cells to die. This medicine is used to treat advanced breast cancer, lung cancer, and pancreatic cancer. This medicine may be used for other purposes; ask your health care provider or pharmacist if you have questions. COMMON BRAND NAME(S): Abraxane What should I tell my health care provider before I take this medicine? They need to know if you have any of these conditions:  kidney disease  liver disease  low blood counts, like low white cell, platelet, or red cell counts  lung or breathing disease, like asthma  tingling of the fingers or toes, or other nerve disorder  an  unusual or allergic reaction to paclitaxel, albumin, other chemotherapy, other medicines, foods, dyes, or preservatives  pregnant or trying to get pregnant  breast-feeding How should I use this medicine? This drug is given as an infusion into a vein. It is administered in a hospital or clinic by a specially trained health care professional. Talk to your pediatrician regarding the use of this medicine in children. Special care may be needed. Overdosage: If you think you have taken too much of this medicine contact a poison control center or emergency room at once. NOTE: This medicine is only for you. Do not share this medicine with others. What if I miss a dose? It is important not to miss your dose. Call your doctor or health care professional if you are unable to keep an appointment. What may interact with this medicine? This medicine may interact with the following medications:  antiviral medicines for hepatitis, HIV or AIDS  certain antibiotics like erythromycin and clarithromycin  certain medicines for fungal infections like ketoconazole and itraconazole  certain medicines for seizures like carbamazepine, phenobarbital, phenytoin  gemfibrozil  nefazodone  rifampin  St. John's wort This list may not describe all possible interactions. Give your health care provider a list of all the medicines, herbs, non-prescription drugs, or dietary supplements you use. Also tell them if you smoke, drink alcohol, or use illegal drugs. Some items may interact with your medicine. What should I watch for while using this medicine? Your condition will be monitored carefully while you are receiving this medicine. You will need important blood work done while you are taking this medicine. This medicine can cause   serious allergic reactions. If you experience allergic reactions like skin rash, itching or hives, swelling of the face, lips, or tongue, tell your doctor or health care professional right  away. In some cases, you may be given additional medicines to help with side effects. Follow all directions for their use. This drug may make you feel generally unwell. This is not uncommon, as chemotherapy can affect healthy cells as well as cancer cells. Report any side effects. Continue your course of treatment even though you feel ill unless your doctor tells you to stop. Call your doctor or health care professional for advice if you get a fever, chills or sore throat, or other symptoms of a cold or flu. Do not treat yourself. This drug decreases your body's ability to fight infections. Try to avoid being around people who are sick. This medicine may increase your risk to bruise or bleed. Call your doctor or health care professional if you notice any unusual bleeding. Be careful brushing and flossing your teeth or using a toothpick because you may get an infection or bleed more easily. If you have any dental work done, tell your dentist you are receiving this medicine. Avoid taking products that contain aspirin, acetaminophen, ibuprofen, naproxen, or ketoprofen unless instructed by your doctor. These medicines may hide a fever. Do not become pregnant while taking this medicine or for 6 months after stopping it. Women should inform their doctor if they wish to become pregnant or think they might be pregnant. Men should not father a child while taking this medicine or for 3 months after stopping it. There is a potential for serious side effects to an unborn child. Talk to your health care professional or pharmacist for more information. Do not breast-feed an infant while taking this medicine or for 2 weeks after stopping it. This medicine may interfere with the ability to get pregnant or to father a child. You should talk to your doctor or health care professional if you are concerned about your fertility. What side effects may I notice from receiving this medicine? Side effects that you should report  to your doctor or health care professional as soon as possible:  allergic reactions like skin rash, itching or hives, swelling of the face, lips, or tongue  breathing problems  changes in vision  fast, irregular heartbeat  low blood pressure  mouth sores  pain, tingling, numbness in the hands or feet  signs of decreased platelets or bleeding - bruising, pinpoint red spots on the skin, black, tarry stools, blood in the urine  signs of decreased red blood cells - unusually weak or tired, feeling faint or lightheaded, falls  signs of infection - fever or chills, cough, sore throat, pain or difficulty passing urine  signs and symptoms of liver injury like dark yellow or brown urine; general ill feeling or flu-like symptoms; light-colored stools; loss of appetite; nausea; right upper belly pain; unusually weak or tired; yellowing of the eyes or skin  swelling of the ankles, feet, hands  unusually slow heartbeat Side effects that usually do not require medical attention (report to your doctor or health care professional if they continue or are bothersome):  diarrhea  hair loss  loss of appetite  nausea, vomiting  tiredness This list may not describe all possible side effects. Call your doctor for medical advice about side effects. You may report side effects to FDA at 1-800-FDA-1088. Where should I keep my medicine? This drug is given in a hospital or clinic and   will not be stored at home. NOTE: This sheet is a summary. It may not cover all possible information. If you have questions about this medicine, talk to your doctor, pharmacist, or health care provider.  2020 [employer]/Gold Standard (2016-10-12 13:03:45)  Carboplatin injection What is this medicine? CARBOPLATIN (KAR boe pla tin) is a chemotherapy drug. It targets fast dividing cells, like cancer cells, and causes these cells to die. This medicine is used to treat ovarian cancer and many other cancers. This medicine  may be used for other purposes; ask your health care provider or pharmacist if you have questions. COMMON BRAND NAME(S): Paraplatin What should I tell my health care provider before I take this medicine? They need to know if you have any of these conditions:  blood disorders  hearing problems  kidney disease  recent or ongoing radiation therapy  an unusual or allergic reaction to carboplatin, cisplatin, other chemotherapy, other medicines, foods, dyes, or preservatives  pregnant or trying to get pregnant  breast-feeding How should I use this medicine? This drug is usually given as an infusion into a vein. It is administered in a hospital or clinic by a specially trained health care professional. Talk to your pediatrician regarding the use of this medicine in children. Special care may be needed. Overdosage: If you think you have taken too much of this medicine contact a poison control center or emergency room at once. NOTE: This medicine is only for you. Do not share this medicine with others. What if I miss a dose? It is important not to miss a dose. Call your doctor or health care professional if you are unable to keep an appointment. What may interact with this medicine?  medicines for seizures  medicines to increase blood counts like filgrastim, pegfilgrastim, sargramostim  some antibiotics like amikacin, gentamicin, neomycin, streptomycin, tobramycin  vaccines Talk to your doctor or health care professional before taking any of these medicines:  acetaminophen  aspirin  ibuprofen  ketoprofen  naproxen This list may not describe all possible interactions. Give your health care provider a list of all the medicines, herbs, non-prescription drugs, or dietary supplements you use. Also tell them if you smoke, drink alcohol, or use illegal drugs. Some items may interact with your medicine. What should I watch for while using this medicine? Your condition will be monitored  carefully while you are receiving this medicine. You will need important blood work done while you are taking this medicine. This drug may make you feel generally unwell. This is not uncommon, as chemotherapy can affect healthy cells as well as cancer cells. Report any side effects. Continue your course of treatment even though you feel ill unless your doctor tells you to stop. In some cases, you may be given additional medicines to help with side effects. Follow all directions for their use. Call your doctor or health care professional for advice if you get a fever, chills or sore throat, or other symptoms of a cold or flu. Do not treat yourself. This drug decreases your body's ability to fight infections. Try to avoid being around people who are sick. This medicine may increase your risk to bruise or bleed. Call your doctor or health care professional if you notice any unusual bleeding. Be careful brushing and flossing your teeth or using a toothpick because you may get an infection or bleed more easily. If you have any dental work done, tell your dentist you are receiving this medicine. Avoid taking products that contain aspirin, acetaminophen,   ibuprofen, naproxen, or ketoprofen unless instructed by your doctor. These medicines may hide a fever. Do not become pregnant while taking this medicine. Women should inform their doctor if they wish to become pregnant or think they might be pregnant. There is a potential for serious side effects to an unborn child. Talk to your health care professional or pharmacist for more information. Do not breast-feed an infant while taking this medicine. What side effects may I notice from receiving this medicine? Side effects that you should report to your doctor or health care professional as soon as possible:  allergic reactions like skin rash, itching or hives, swelling of the face, lips, or tongue  signs of infection - fever or chills, cough, sore throat, pain or  difficulty passing urine  signs of decreased platelets or bleeding - bruising, pinpoint red spots on the skin, black, tarry stools, nosebleeds  signs of decreased red blood cells - unusually weak or tired, fainting spells, lightheadedness  breathing problems  changes in hearing  changes in vision  chest pain  high blood pressure  low blood counts - This drug may decrease the number of white blood cells, red blood cells and platelets. You may be at increased risk for infections and bleeding.  nausea and vomiting  pain, swelling, redness or irritation at the injection site  pain, tingling, numbness in the hands or feet  problems with balance, talking, walking  trouble passing urine or change in the amount of urine Side effects that usually do not require medical attention (report to your doctor or health care professional if they continue or are bothersome):  hair loss  loss of appetite  metallic taste in the mouth or changes in taste This list may not describe all possible side effects. Call your doctor for medical advice about side effects. You may report side effects to FDA at 1-800-FDA-1088. Where should I keep my medicine? This drug is given in a hospital or clinic and will not be stored at home. NOTE: This sheet is a summary. It may not cover all possible information. If you have questions about this medicine, talk to your doctor, pharmacist, or health care provider.  2020 [employer]vier/Gold Standard (2007-05-16 14:38:05)     

## 2019-07-11 ENCOUNTER — Other Ambulatory Visit: Payer: Self-pay

## 2019-07-11 NOTE — Progress Notes (Signed)
Pharmacist Chemotherapy Monitoring - Follow Up Assessment    I verify that I have reviewed each item in the below checklist:  . Regimen for the patient is scheduled for the appropriate day and plan matches scheduled date. Marland Kitchen Appropriate non-routine labs are ordered dependent on drug ordered. . If applicable, additional medications reviewed and ordered per protocol based on lifetime cumulative doses and/or treatment regimen.   Plan for follow-up and/or issues identified: No . I-vent associated with next due treatment: No . MD and/or nursing notified: No  Philomena Course 07/11/2019 1:32 PM

## 2019-07-12 ENCOUNTER — Other Ambulatory Visit: Payer: Self-pay | Admitting: Oncology

## 2019-07-12 ENCOUNTER — Other Ambulatory Visit: Payer: Self-pay

## 2019-07-12 ENCOUNTER — Ambulatory Visit
Admission: RE | Admit: 2019-07-12 | Discharge: 2019-07-12 | Disposition: A | Discharge status: Home / Self Care | Payer: Commercial Managed Care - PPO | Source: Ambulatory Visit | Attending: Oncology | Admitting: Oncology

## 2019-07-12 DIAGNOSIS — C50412 Malignant neoplasm of upper-outer quadrant of left female breast: Secondary | ICD-10-CM

## 2019-07-12 DIAGNOSIS — Z171 Estrogen receptor negative status [ER-]: Secondary | ICD-10-CM

## 2019-07-12 DIAGNOSIS — C50211 Malignant neoplasm of upper-inner quadrant of right female breast: Secondary | ICD-10-CM

## 2019-07-13 ENCOUNTER — Encounter: Payer: Self-pay | Admitting: *Deleted

## 2019-07-13 ENCOUNTER — Encounter (HOSPITAL_COMMUNITY)
Admission: RE | Admit: 2019-07-13 | Discharge: 2019-07-13 | Disposition: A | Discharge status: Home / Self Care | Payer: Commercial Managed Care - PPO | Source: Ambulatory Visit | Attending: Oncology | Admitting: Oncology

## 2019-07-13 ENCOUNTER — Ambulatory Visit: Payer: Commercial Managed Care - PPO

## 2019-07-13 DIAGNOSIS — C50211 Malignant neoplasm of upper-inner quadrant of right female breast: Secondary | ICD-10-CM | POA: Diagnosis present

## 2019-07-13 DIAGNOSIS — Z171 Estrogen receptor negative status [ER-]: Secondary | ICD-10-CM | POA: Insufficient documentation

## 2019-07-13 DIAGNOSIS — C50412 Malignant neoplasm of upper-outer quadrant of left female breast: Secondary | ICD-10-CM | POA: Insufficient documentation

## 2019-07-13 MED ORDER — TECHNETIUM TC 99M MEDRONATE IV KIT
19.8000 | PACK | Freq: Once | INTRAVENOUS | Status: AC | PRN
  Administered 2019-07-13: 19.8 via INTRAVENOUS

## 2019-07-16 ENCOUNTER — Telehealth: Payer: Self-pay | Admitting: *Deleted

## 2019-07-16 ENCOUNTER — Other Ambulatory Visit: Payer: Self-pay | Admitting: Oncology

## 2019-07-16 ENCOUNTER — Encounter: Payer: Self-pay | Admitting: *Deleted

## 2019-07-16 NOTE — Telephone Encounter (Signed)
Left vm on verified number informing of negative bone scan results

## 2019-07-16 NOTE — Progress Notes (Signed)
[place]  [phone]) [phone] [phone]) [phone]     ID: [name] DOB: [birth year]  MR#: [id number]  VQQ#:[id number]  Patient Care Team: System, Pcp Not In as PCP - General Jewelene Mairena, [name], MD as Consulting Physician (Oncology) Fulton Reek, MD as Referring Physician (Obstetrics and Gynecology) [name], RN as Oncology Nurse Navigator [name], RN as Oncology Nurse Navigator [name], MD as Consulting Physician (Plastic Surgery) [name], MD OTHER MD: PCP= [name] MD  CHIEF COMPLAINT: BRCA positive breast cancer  CURRENT TREATMENT: Neoadjuvant chemotherapy   INTERVAL HISTORY: Erynne Kealey" returns today for follow up and treatment of her BRCA positive breast cancer, accompanied by her husband [name].  Since her last visit, she underwent breast MRI on 07/06/2019 showing: breast composition C; 3 cm known malignancy in upper-outer left breast; indeterminate 5 mm mass in medial left breast at 9 o'clock; indeterminate 5.3 mm mass in superior right breast between 12 and 1 o'clock; marked background enhancement throughout remainder of both breasts with multiple foci and small masses; no other adenopathy.  She started neoadjuvant chemotherapy consisting of carboplatin day 1, Abraxane days 1,8,15, repeated Q21 days x 4 cycles, on 07/10/2019. Today is day 8 cycle 1.  She underwent repeat biopsy of the left axillary lymph node on 07/12/2019. Pathology [phone]) showed benign fibroadipose tissue with no lymphoid tissue present. This was felt to be discordant  She also underwent bone scan on 07/13/2019, which was negative for metastatic disease.  She is scheduled for biopsy of the right breast mass in question tomorrow, 07/18/2019   REVIEW OF SYSTEMS: [name] did quite well with her first cycle of chemotherapy.  The steroids premeds and antinausea made her feel "too good" and she had difficulty sleeping.  The next 2days she  felt a little tired.  She had a little cough on 05 21, which was day 4 in the next 2 days she was not up to snuff but by today she feels quite recovered.  She tells me the children are dealing with this and she has cut her hair short.  She has already an appointment with the [facility]".   HISTORY OF CURRENT ILLNESS: From the original intake note:  Montez Stryker has a history of breast cancer, diagnosed in 2012 while she lived in [place], [place], and treated in [place] under Dr [name].  We are obtaining data from that experience but according to the patient her tumor was the size of an apricot.  She did not have axillary lymph node sampling or radiation.  She remembers 8 neoadjuvant chemotherapy treatments given every 2 weeks with the first including the "red drug", which suggest Cytoxan and Adriamycin in dose dense fashion x4 most likely followed by either Taxol or Taxotere in dose dense fashion x4.  After that experience and given her breast density and BRCA mutation our practice here would have been a yearly follow-up with MRI in addition to yearly mammography.   More recently [name] herself palpated a change in her right breast.  She brought it to Dr. [name] attention and underwent bilateral diagnostic mammography with tomography and left breast ultrasonography at [facility] on 06/14/2019 showing: breast density category D; there was an area of asymmetry in the upper outer quadrant of the left breast, and ultrasound of that area showed a 2.8 cm mass.  Ultrasound of the axilla showed 1.8 cm lymph node with cortical thickening.    In the right breast they saw only postsurgical changes,  with 2 biopsy clips in the retroareolar area.  Accordingly on 06/19/2019 she proceeded to biopsy of the left breast area in question. 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  The left axillary lymph node biopsy was  nondiagnostic, with very spare sampling of fibroadipose and lymphoid tissue.  Estrogen receptor was negative, with less than 1% uptake, progesterone receptor was negative, with less than 1% uptake, HER-2 was negative by immunohistochemistry with a score of 1+.  The patient's subsequent history is as detailed below.   PAST MEDICAL HISTORY: Past Medical History:  Diagnosis Date   Cancer [facility])    breast cancer 2012    PAST SURGICAL HISTORY: Past Surgical History:  Procedure Laterality Date   BREAST SURGERY     lumpectomy on right breast   CESAREAN SECTION MULTI-GESTATIONAL N/A 06/01/2015   Procedure: CESAREAN SECTION MULTI-GESTATIONAL;  Surgeon: Waymon Amato, MD;  Location: [facility];  Service: Obstetrics;  Laterality: N/A;   IR IMAGING GUIDED PORT INSERTION  07/05/2019   LOBECTOMY Right 2012   SALPINGECTOMY      FAMILY HISTORY: Family History  Problem Relation Age of Onset   Breast cancer Maternal Grandmother        very late in life, lived to 54   Breast cancer Paternal Grandmother        died at age 9   Alzheimer's disease Paternal Grandfather     The patient's parents are both turning 29 in 2021.  The paternal grandfather died from Alzheimer's disease.  The paternal grandmother died from breast cancer at the age of 36.  The patient's father had 1 sister who had died as a child from meningitis.  He had no brothers.  On the mother side, a maternal grandmother did have breast cancer very late in life, living to be 60.  The paternal grandfather and the only maternal aunt had no history of cancer.  The patient has 1 brother, in good health, no sisters.   GYNECOLOGIC HISTORY:  Patient's last menstrual period was 06/21/2019. Menarche: 34 years old Age at first live birth: 34 years old [place] P twins LMP regular as of May 2021 Contraceptive HRT no  Hysterectomy?  No BSO?  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  Her husband [name]") works for an NGO (the [employer]) out of [place] which helps small agencies doing research set up and succeed.  Their twins are 33 years old. [name] has (2?) children from a prior marriage. The patient belongs to an orthodox denomination    ADVANCED DIRECTIVES: In the absence of any documentation to the contrary, the patient's spouse is their HCPOA.    HEALTH MAINTENANCE: Social History   Tobacco Use   Smoking status: Never Smoker   Smokeless tobacco: Never Used  Substance Use Topics   Alcohol use: No   Drug use: No     Colonoscopy: n/a (age)  PAP: Up-to-date  Bone density: n/a (age)   Allergies  Allergen Reactions   Paclitaxel     Anaphylactic per pt history in MD note    Current Outpatient Medications  Medication Sig Dispense Refill   dexamethasone (DECADRON) 4 MG tablet Take 2 tablets (8 mg total) by mouth daily. Start the day after carboplatin chemotherapy (day 1 of each 21 days cycle) and continue for 2 days (days 2 and  3, counting chemotherapy day as day 1);  last dose the morning of day 4. 30 tablet 1   lidocaine-prilocaine (EMLA) cream Apply to affected area once 30 g 3   LORazepam (ATIVAN) 0.5 MG tablet Take 1 tablet (0.5 mg total) by mouth at bedtime as needed (Nausea or vomiting). 20 tablet 0   prochlorperazine (COMPAZINE) 10 MG tablet Take 1 tablet (10 mg total) by mouth every 6 (six) hours as needed (Nausea or vomiting). 30 tablet 1   No current facility-administered medications for this visit.    OBJECTIVE: White woman in no acute distress  There were no vitals filed for this visit.   There is no height or weight on file to calculate BMI.   Wt Readings from Last 3 Encounters:  07/17/19 139 lb (63 kg)  07/10/19 142 lb 1.9 oz (64.5 kg)  07/06/19 144 lb 9.6 oz (65.6 kg)      ECOG FS:1 - Symptomatic but completely ambulatory  Sclerae unicteric, EOMs intact Wearing a mask No  cervical or supraclavicular adenopathy Lungs no rales or rhonchi Heart regular rate and rhythm Abd soft, nontender, positive bowel sounds MSK no focal spinal tenderness, no upper extremity lymphedema Neuro: nonfocal, well oriented, appropriate affect Breasts: The right breast is benign.  In the left breast upper outer quadrant there is still a firm movable 2 cm mass that is palpable without overlying skin involvement.   LAB RESULTS:      Component Value Date/Time   NA 139 07/17/2019 0828   K 4.1 07/17/2019 0828   CL 107 07/17/2019 0828   CO2 23 07/17/2019 0828   GLUCOSE 99 07/17/2019 0828   BUN 9 07/17/2019 0828   CREATININE 0.74 07/17/2019 0828   CALCIUM 9.6 07/17/2019 0828   PROT 7.2 07/17/2019 0828   ALBUMIN 4.3 07/17/2019 0828   AST 20 07/17/2019 0828   ALT 23 07/17/2019 0828   ALKPHOS 32 (L) 07/17/2019 0828   BILITOT 0.7 07/17/2019 0828   GFRNONAA >60 07/17/2019 0828   GFRAA >60 07/17/2019 0828    No results found for: TOTALPROTELP, ALBUMINELP, A1GS, A2GS, BETS, BETA2SER, GAMS, MSPIKE, SPEI  Lab Results  Component Value Date   WBC 4.1 07/17/2019   NEUTROABS 3.0 07/17/2019   HGB 11.7 (L) 07/17/2019   HCT 34.4 (L) 07/17/2019   MCV 90.8 07/17/2019   PLT 179 07/17/2019    No results found for: LABCA2  No components found for: [id number]  No results for input(s): INR in the last 168 hours.  No results found for: LABCA2  No results found for: OHY073  No results found for: XTG626  No results found for: RSW546  No results found for: CA2729  No components found for: HGQUANT  No results found for: CEA1 / No results found for: CEA1   No results found for: AFPTUMOR  No results found for: CHROMOGRNA  No results found for: KPAFRELGTCHN, LAMBDASER, KAPLAMBRATIO (kappa/lambda light chains)  No results found for: HGBA, HGBA2QUANT, HGBFQUANT, HGBSQUAN (Hemoglobinopathy evaluation)   No results found for: LDH  No results found for: IRON, TIBC,  IRONPCTSAT (Iron and TIBC)  No results found for: FERRITIN  Urinalysis No results found for: COLORURINE, APPEARANCEUR, LABSPEC, PHURINE, GLUCOSEU, HGBUR, BILIRUBINUR, KETONESUR, PROTEINUR, UROBILINOGEN, NITRITE, LEUKOCYTESUR   STUDIES: CT Chest W Contrast  Result Date: 07/05/2019 CLINICAL DATA:  Breast cancer staging. EXAM: CT CHEST, ABDOMEN, AND PELVIS WITH CONTRAST TECHNIQUE: Multidetector CT imaging of the chest, abdomen and pelvis was performed following the standard protocol during bolus administration  of intravenous contrast. CONTRAST:  127m OMNIPAQUE IOHEXOL 300 MG/ML  SOLN COMPARISON:  None FINDINGS: CT CHEST FINDINGS Cardiovascular: Normal caliber thoracic aorta. Normal heart size. No pericardial effusion. No significant atherosclerotic changes. Central pulmonary vessels are unremarkable. Mediastinum/Nodes: No thoracic inlet or axillary lymphadenopathy. The clips likely from prior biopsy in the LEFT axilla and in the LEFT and RIGHT breast. Small lymph nodes along the margin of the LEFT pectoralis muscle (image 22, series 2) 7 mm. Similar small lymph nodes in the LEFT axilla. No subpectoral adenopathy. No supraclavicular adenopathy Lungs/Pleura: Airways are patent. Lungs are clear. Musculoskeletal: Heterogeneous enhancement of the LEFT breast near biopsy clips measuring 19 mm (image 26, series 2. No internal mammary adenopathy or definable chest wall mass aside from the enhancement in the LEFT breast. Smaller areas of enhancement adjacent to this focus. This area is adjacent to a presumed biopsy clip in the central portion of the upper LEFT breast. CT ABDOMEN PELVIS FINDINGS Hepatobiliary: Liver is normal. Gallbladder is normal. No biliary ductal dilation. No focal hepatic mass. Pancreas: Pancreas is normal without focal lesion or ductal dilation. No peripancreatic inflammation. Spleen: Spleen is normal size without focal lesion. Adrenals/Urinary Tract: Adrenal glands are normal. Renal  enhancement is smooth. No hydronephrosis. Urinary bladder moderately distended, unremarkable by CT. Stomach/Bowel: No acute gastrointestinal process. The appendix is normal. Stool fills much of the colon. Mixed with positive contrast. No pericolonic stranding. Vascular/Lymphatic: Normal caliber abdominal aorta. No adenopathy in the retroperitoneum. No retrocrural adenopathy. No upper abdominal adenopathy. No visible mesenteric lymphadenopathy. No pelvic lymphadenopathy. Reproductive: Corpus luteum cyst in the RIGHT adnexa. CT appearance of pelvic viscera otherwise unremarkable. Other: No abdominal wall hernia or abnormality. No abdominopelvic ascites. Musculoskeletal: No acute bone finding. No destructive bone process. IMPRESSION: 1. Heterogeneous enhancement with nodular features in the LEFT breast and small LEFT axillary lymph nodes, less than a cm as described. 2. No evidence of metastatic disease in the abdomen or pelvis. Electronically Signed   By: GZetta BillsM.D.   On: 07/05/2019 15:38   NM Bone Scan Whole Body  Result Date: 07/13/2019 CLINICAL DATA:  Breast cancer, staging breast cancer staging . EXAM: NUCLEAR MEDICINE WHOLE BODY BONE SCAN TECHNIQUE: Whole body anterior and posterior images were obtained approximately 3 hours after intravenous injection of radiopharmaceutical. RADIOPHARMACEUTICALS:  19.3 mCi Technetium-936mDP IV COMPARISON:  CT 07/05/2019. FINDINGS: Bilateral renal function excretion. No focal bony abnormality identified. No evidence of metastatic disease. IMPRESSION: Negative exam.  No evidence of metastatic disease. Electronically Signed   By: ThMarcello MooresRegister   On: 07/13/2019 14:25   CT Abdomen Pelvis W Contrast  Result Date: 07/05/2019 CLINICAL DATA:  Breast cancer staging. EXAM: CT CHEST, ABDOMEN, AND PELVIS WITH CONTRAST TECHNIQUE: Multidetector CT imaging of the chest, abdomen and pelvis was performed following the standard protocol during bolus administration of  intravenous contrast. CONTRAST:  10016mMNIPAQUE IOHEXOL 300 MG/ML  SOLN COMPARISON:  None FINDINGS: CT CHEST FINDINGS Cardiovascular: Normal caliber thoracic aorta. Normal heart size. No pericardial effusion. No significant atherosclerotic changes. Central pulmonary vessels are unremarkable. Mediastinum/Nodes: No thoracic inlet or axillary lymphadenopathy. The clips likely from prior biopsy in the LEFT axilla and in the LEFT and RIGHT breast. Small lymph nodes along the margin of the LEFT pectoralis muscle (image 22, series 2) 7 mm. Similar small lymph nodes in the LEFT axilla. No subpectoral adenopathy. No supraclavicular adenopathy Lungs/Pleura: Airways are patent. Lungs are clear. Musculoskeletal: Heterogeneous enhancement of the LEFT breast near biopsy clips measuring 19  mm (image 26, series 2. No internal mammary adenopathy or definable chest wall mass aside from the enhancement in the LEFT breast. Smaller areas of enhancement adjacent to this focus. This area is adjacent to a presumed biopsy clip in the central portion of the upper LEFT breast. CT ABDOMEN PELVIS FINDINGS Hepatobiliary: Liver is normal. Gallbladder is normal. No biliary ductal dilation. No focal hepatic mass. Pancreas: Pancreas is normal without focal lesion or ductal dilation. No peripancreatic inflammation. Spleen: Spleen is normal size without focal lesion. Adrenals/Urinary Tract: Adrenal glands are normal. Renal enhancement is smooth. No hydronephrosis. Urinary bladder moderately distended, unremarkable by CT. Stomach/Bowel: No acute gastrointestinal process. The appendix is normal. Stool fills much of the colon. Mixed with positive contrast. No pericolonic stranding. Vascular/Lymphatic: Normal caliber abdominal aorta. No adenopathy in the retroperitoneum. No retrocrural adenopathy. No upper abdominal adenopathy. No visible mesenteric lymphadenopathy. No pelvic lymphadenopathy. Reproductive: Corpus luteum cyst in the RIGHT adnexa. CT  appearance of pelvic viscera otherwise unremarkable. Other: No abdominal wall hernia or abnormality. No abdominopelvic ascites. Musculoskeletal: No acute bone finding. No destructive bone process. IMPRESSION: 1. Heterogeneous enhancement with nodular features in the LEFT breast and small LEFT axillary lymph nodes, less than a cm as described. 2. No evidence of metastatic disease in the abdomen or pelvis. Electronically Signed   By: Zetta Bills M.D.   On: 07/05/2019 15:38   MR BREAST RIGHT W WO CONTRAST INC CAD  Result Date: 07/18/2019 CLINICAL DATA:  34 year old premenopausal patient with recent diagnosis left breast cancer, planning bilateral mastectomies. Known BRCA 1 mutation. On recent bilateral breast MRI there was a mass described in the upper inner right breast recommended for MRI guided biopsy. The patient had marked background parenchymal enhancement pattern on the recent breast MRI. Today, she is having her menstrual cycle and recently completed her second cycle of chemotherapy. LABS:  None obtained EXAM: MR OF THE RIGHT BREAST WITH AND WITHOUT CONTRAST TECHNIQUE: Multiplanar, multisequence MR images of the right breast were obtained prior to and following the intravenous administration of 6 ml of Gadavist. Three-dimensional MR images were rendered by post-processing of the original MR data on an independent workstation. The three-dimensional MR images were interpreted, and findings are reported in the following complete MRI report for this study. Three dimensional images were evaluated at the independent DynaCad workstation COMPARISON:  Recent MRI of the breast Jul 06, 2019 FINDINGS: Breast composition: c. Heterogeneous fibroglandular tissue. Background parenchymal enhancement: Moderate. Right breast: The background parenchymal enhancement pattern is significantly decreased on today's MRI, and much less nodular today compared to Jul 06, 2019. No focal mass is identified in the right breast to  suggest malignancy and there is no target for biopsy. Lymph nodes: No abnormal appearing lymph nodes. Ancillary findings:  None. IMPRESSION: Decreased background parenchymal enhancement of the right breast compared to recent prior on Jul 06, 2019 in this premenopausal patient. There is no enhancing mass to target for biopsy. No suspicious findings. RECOMMENDATION: Continued treatment planning for known left breast cancer. Patient plans for bilateral mastectomies, and therefore no further imaging follow-up is suggested unless new areas of concern arise. BI-RADS CATEGORY  1: Negative. Electronically Signed   By: Curlene Dolphin M.D.   On: 07/18/2019 10:53   MR BREAST BILATERAL W WO CONTRAST INC CAD  Result Date: 07/09/2019 CLINICAL DATA:  Recent the diagnosed left breast cancer. LABS:  None avail EXAM: BILATERAL BREAST MRI WITH AND WITHOUT CONTRAST TECHNIQUE: Multiplanar, multisequence MR images of both breasts were obtained  prior to and following the intravenous administration of 6 ml of Gadavist Three-dimensional MR images were rendered by post-processing of the original MR data on an independent workstation. The three-dimensional MR images were interpreted, and findings are reported in the following complete MRI report for this study. Three dimensional images were evaluated at the independent DynaCad workstation COMPARISON:  MRI of the breast June 07, 2012. Mammography from April 2021. FINDINGS: Breast composition: c. Heterogeneous fibroglandular tissue. Background parenchymal enhancement: Marked Right breast: Distortion in the central right breast correlates with a previous biopsy and is consistent with biopsy change. There is diffuse background enhancement on the right limiting evaluation for small masses. There are multiple similar appearing foci and small masses throughout the right breast which demonstrate persistent kinetics and are considered benign. There is a mass in the superior right breast located  between 12 o'clock and 1 o'clock measuring 5.3 mm on series 6, image 52 which demonstrates plateau kinetics. No other suspicious masses are seen in the right breast. Left breast: The patient's known malignancy is identified in the upper outer quadrant of the left breast measuring 2.8 by 3.0 by 2.7 cm in transverse, AP, and craniocaudal dimensions. There is no involvement of the underlying pectoralis muscle identified. A small mass in the upper outer left breast which is T2 bright, seen on series 6, image 78 is consistent with an intramammary lymph node. There is an indeterminate mass at 9 o'clock in the left breast on series 6, image 86 measuring 5 mm which demonstrates plateau kinetics. No other suspicious findings seen in the left breast. Lymph nodes: The previously biopsied left axillary node is again identified are mildly prominent. No other suspicious nodes are identified on today's study. Ancillary findings:  None. IMPRESSION: 1. The patient's known malignancy measures 2.8 x 3.0 x 2.7 cm in the upper outer quadrant the left breast. 2. There is an indeterminate 5 mm mass in the medial left breast at 9 o'clock which demonstrates plateau kinetics and is indeterminate. 3. There is an indeterminate 5.3 mm mass in the superior right breast between 12 and 1 o'clock which demonstrates plateau kinetics. 4. There is marked background enhancement throughout the remainder of both breasts with numerous foci and small masses demonstrating persistent kinetics, consistent with a benign etiology. 5. The previously biopsied left axillary lymph node is mildly prominent and contains a biopsy clip. No other evidence of adenopathy. RECOMMENDATION: The patient is scheduled to return for a re-biopsy of a left axillary lymph node. The patient is pursuing bilateral mastectomies. As a result, no additional biopsies are necessary of the left breast. Recommend MRI guided biopsy of the superiorly located right breast mass. These findings  and recommendations were called to [facility] in the cancer center. BI-RADS CATEGORY  4: Suspicious. Electronically Signed   By: Dorise Bullion III M.D   On: 07/09/2019 13:17   [place] AXILLARY NODE CORE BIOPSY LEFT  Addendum Date: 07/13/2019   ADDENDUM REPORT: 07/13/2019 13:58 ADDENDUM: Pathology revealed BENIGN FIBROADIPOSE TISSUE. NO LYMPHOID TISSUE PRESENT of the LEFT axillary lymph node. This was found to be discordant by Dr. Curlene Dolphin. Pathology results were discussed with the patient by telephone. The patient reported doing well after the biopsy with tenderness at the site. Post biopsy instructions and care were reviewed and questions were answered. The patient was encouraged to call [facility] for any additional concerns. Given no diagnostic sample, correlate with sentinel node biopsy is recommended. Patient is scheduled for MRI guided  biopsy of the superiorly located RIGHT breast mass on 07/18/19. Pathology results reported by Stacie Acres RN on 07/13/2019. Electronically Signed   By: Curlene Dolphin M.D.   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 This was diagnosed at [facility] following ultrasound-guided core needle biopsy a left breast mass. An ultrasound of the left axilla showed a lymph node measuring 1.8 x 1.3 x 0.7 cm, with cortical thickness approximately 2.9 mm when I reviewed the images. This was biopsied under ultrasound guidance in [place] and the pathology reported nondiagnostic samples. She reports she has had her first round of chemotherapy. EXAM: [place] AXILLARY NODE CORE BIOPSY LEFT COMPARISON:  Previous exam(s). PROCEDURE: I met with the patient and we discussed the procedure of ultrasound-guided biopsy, including benefits and alternatives. We discussed the high likelihood of a successful  procedure. We discussed the risks of the procedure, including infection, bleeding, tissue injury, clip migration, and inadequate sampling. Informed written consent was given. The usual time-out protocol was performed immediately prior to the procedure. Three similar appearing axillary lymph nodes are identified. None of these lymph nodes currently appears or measures as large as the lymph node that was measured on the left axillary ultrasound from June 19, 2019. Using sterile technique and 1% Lidocaine as local anesthetic, under direct ultrasound visualization, a 14 gauge spring-loaded device was used to perform biopsy of what was thought to be the lymph node previously biopsied on 06/19/2019, measuring approximately 1.5 x 0.7 x 0.7 cm, with cortical thickness of 2.5 mm. Today one sample of this lymph node was performed using a lateral approach. The biopsy is technically difficult due to the deep position of the lymph node, the mobility of the lymph node and the adjacent musculature. Therefore, I performed 3 additional biopsies of the lymph node slightly lateral to it, which has a similar appearance and measures 1.0 cm x 0.9 x 0.4 cm, with cortical thickness of 2.5 mm. At the conclusion of the procedure a Q clip tissue marker clip was deployed into the biopsy cavity. Follow up 2 view mammogram was performed and dictated separately. IMPRESSION: Ultrasound guided biopsy of left axillary lymph node(s). No apparent complications. Electronically Signed: By: Curlene Dolphin M.D. On: 07/12/2019 12:42   MM CLIP PLACEMENT LEFT  Result Date: 07/12/2019 CLINICAL DATA:  Ultrasound second ultrasound-guided core needle biopsy of left axillary lymph node performed today. EXAM: DIAGNOSTIC LEFT MAMMOGRAM POST ULTRASOUND BIOPSY COMPARISON:  Previous exam(s). FINDINGS: Mammographic images were obtained following ultrasound guided biopsy of left axillary lymph node(s). The biopsy marking clip is in expected position at the site of  biopsy. The Q shaped biopsy clip placed today is 1.2 cm inferior to the more circular biopsy clip placed at an outside institution recently. IMPRESSION: Appropriate positioning of the Q shaped biopsy marking clip at the site of biopsy in the left axilla. Final Assessment: Post Procedure Mammograms for Marker Placement Electronically Signed   By: Curlene Dolphin M.D.   On: 07/12/2019 12:43   IR IMAGING GUIDED PORT INSERTION  Result Date: 07/05/2019 CLINICAL DATA:  Left breast cancer EXAM: RIGHT INTERNAL JUGULAR SINGLE LUMEN POWER PORT CATHETER INSERTION Date:  07/05/2019 07/05/2019 2:37 pm Radiologist:  M. Daryll Brod, MD Guidance:  Ultrasound and fluoroscopic MEDICATIONS: Ancef 2 g; The antibiotic was administered within an appropriate time interval prior to skin  puncture. ANESTHESIA/SEDATION: Versed 4.0 mg IV; Fentanyl 100 mcg IV; Moderate Sedation Time:  33 minutes The patient was continuously monitored during the procedure by the interventional radiology nurse under my direct supervision. FLUOROSCOPY TIME:  One minutes, 0 seconds (3 mGy) COMPLICATIONS: None immediate. CONTRAST:  None. PROCEDURE: Informed consent was obtained from the patient following explanation of the procedure, risks, benefits and alternatives. The patient understands, agrees and consents for the procedure. All questions were addressed. A time out was performed. Maximal barrier sterile technique utilized including caps, mask, sterile gowns, sterile gloves, large sterile drape, hand hygiene, and 2% chlorhexidine scrub. Under sterile conditions and local anesthesia, right internal jugular micropuncture venous access was performed. Access was performed with ultrasound. Images were obtained for documentation. A guide wire was inserted followed by a transitional dilator. This allowed insertion of a guide wire and catheter into the IVC. Measurements were obtained from the SVC / RA junction back to the right IJ venotomy site. In the right  infraclavicular chest, a subcutaneous pocket was created over the second anterior rib. This was done under sterile conditions and local anesthesia. 1% lidocaine with epinephrine was utilized for this. A 2.5 cm incision was made in the skin. Blunt dissection was performed to create a subcutaneous pocket over the right pectoralis major muscle. The pocket was flushed with saline vigorously. There was adequate hemostasis. The port catheter was assembled and checked for leakage. The port catheter was secured in the pocket with two retention sutures. The tubing was tunneled subcutaneously to the right venotomy site and inserted into the SVC/RA junction through a valved peel-away sheath. Position was confirmed with fluoroscopy. Images were obtained for documentation. The patient tolerated the procedure well. No immediate complications. Incisions were closed in a two layer fashion with 4 - 0 Vicryl suture. Dermabond was applied to the skin. The port catheter was accessed, blood was aspirated followed by saline and heparin flushes. Needle was removed. A dry sterile dressing was applied. IMPRESSION: Ultrasound and fluoroscopically guided right internal jugular single lumen power port catheter insertion. Tip in the SVC/RA junction. Catheter ready for use. Electronically Signed   By: Jerilynn Mages.  Shick M.D.   On: 07/05/2019 14:55    ELIGIBLE FOR AVAILABLE RESEARCH PROTOCOL: [id number]  ASSESSMENT: 34 y.o. BRCA positive [facility] woman  (0) BRCA positivity:  (a) s/p bilateral salpingectomy  (b) pelvic ultrasound including transvaginal study 06/01/2019 unremarkable, as was CA 125  (c) planning on bilateral mastectomies  (d) bilateral salpingectomy 11/28/2013, age 62 (no oophorectomy)  RIGHT BREAST CANCER (1) Status post right lumpectomy 2012 for a [        ]  (a) received neoadjuvant chemotherapy consisting of 4 cycles of doxorubicin and cyclophosphamide, followed by paclitaxel x1 (developed anaphylaxis with the first dose),  then 4 doses of docetaxel completed August 2012  (b) [no adjuvant radiation]  LEFT BREAST CANCER (2) Status post left breast upper outer quadrant biopsy 06/19/2019 for a clinical T2 N1, stage IIIB invasive ductal carcinoma, grade 3, triple negative  (a) left axillary lymph node biopsy 07/12/2019 nondiagnostic [discordant]  (b) chest abdomen and pelvis CT scan 07/05/2019 shows small left axillary lymph nodes, 1.5 cm enhancement in the left breast, no internal mammary adenopathy, no evidence of metastatic disease  (c) bone scan 07/13/2019 shows no evidence of bony metastatic disease  (d) biopsy of RIGHT breast mass 07/18/2019  (3) neoadjuvant chemotherapy consisting of carboplatin day 1, Abraxane days 1,8,15, repeated Q21 days x 4 cycles, starting 07/10/2019  (4) definitive  surgery to follow  (5) adjuvant radiation as appropriate  (6) consider adjuvant pembrolizumab or PARP inhibitor   PLAN: Maggie tolerated her first cycle of chemotherapy well and her counts today are good enough that we do not need to do prophylactic antibiotics.  I am not changing the doses and she will return in 1 week for cycle #2.  She understands she will lose her hair within the next week or so and is already making arrangements regarding this.  She is using the Compazine appropriately.  She understands that for the day 8 and 15 "little" treatments, when she receives the Abraxane alone, she will not need any steroids.  She knows to call for any other issues that may develop before the next visit  Total encounter time 30 minutes.Sarajane Jews C. Rilan Eiland, MD 07/18/2019 5:43 PM Medical Oncology and Hematology [facility] [place], [place] [address] Tel. [phone]    Fax. [phone]   This document serves as a record of services personally performed by Lurline Del, MD. It was created on his behalf by Wilburn Mylar, a trained medical scribe. The creation of this record is  based on the scribe's personal observations and the provider's statements to them.   I, Lurline Del MD, have reviewed the above documentation for accuracy and completeness, and I agree with the above.   *Total Encounter Time as defined by the Centers for Medicare and Medicaid Services includes, in addition to the face-to-face time of a patient visit (documented in the note above) non-face-to-face time: obtaining and reviewing outside history, ordering and reviewing medications, tests or procedures, care coordination (communications with other health care professionals or caregivers) and documentation in the medical record.

## 2019-07-17 ENCOUNTER — Other Ambulatory Visit: Payer: Self-pay

## 2019-07-17 ENCOUNTER — Inpatient Hospital Stay: Payer: Commercial Managed Care - PPO

## 2019-07-17 ENCOUNTER — Inpatient Hospital Stay (HOSPITAL_BASED_OUTPATIENT_CLINIC_OR_DEPARTMENT_OTHER): Payer: Commercial Managed Care - PPO | Admitting: Oncology

## 2019-07-17 ENCOUNTER — Encounter: Payer: Self-pay | Admitting: *Deleted

## 2019-07-17 VITALS — BP 117/90 | HR 87 | Temp 98.6°F | Resp 18 | Wt 139.0 lb

## 2019-07-17 DIAGNOSIS — Z171 Estrogen receptor negative status [ER-]: Secondary | ICD-10-CM

## 2019-07-17 DIAGNOSIS — C50211 Malignant neoplasm of upper-inner quadrant of right female breast: Secondary | ICD-10-CM

## 2019-07-17 DIAGNOSIS — C50412 Malignant neoplasm of upper-outer quadrant of left female breast: Secondary | ICD-10-CM

## 2019-07-17 DIAGNOSIS — Z5111 Encounter for antineoplastic chemotherapy: Secondary | ICD-10-CM | POA: Diagnosis not present

## 2019-07-17 LAB — COMPREHENSIVE METABOLIC PANEL
ALT: 23 U/L (ref 0–44)
AST: 20 U/L (ref 15–41)
Albumin: 4.3 g/dL (ref 3.5–5.0)
Alkaline Phosphatase: 32 U/L — ABNORMAL LOW (ref 38–126)
Anion gap: 9 (ref 5–15)
BUN: 9 mg/dL (ref 6–20)
CO2: 23 mmol/L (ref 22–32)
Calcium: 9.6 mg/dL (ref 8.9–10.3)
Chloride: 107 mmol/L (ref 98–111)
Creatinine, Ser: 0.74 mg/dL (ref 0.44–1.00)
GFR calc Af Amer: >60 mL/min (ref >60)
GFR calc non Af Amer: >60 mL/min (ref >60)
Glucose, Bld: 99 mg/dL (ref 70–99)
Potassium: 4.1 mmol/L (ref 3.5–5.1)
Sodium: 139 mmol/L (ref 135–145)
Total Bilirubin: 0.7 mg/dL (ref 0.3–1.2)
Total Protein: 7.2 g/dL (ref 6.5–8.1)

## 2019-07-17 LAB — CBC WITH DIFFERENTIAL/PLATELET
Abs Immature Granulocytes: 0.03 10*3/uL (ref 0.00–0.07)
Basophils Absolute: 0 10*3/uL (ref 0.0–0.1)
Basophils Relative: 1 %
Eosinophils Absolute: 0.1 10*3/uL (ref 0.0–0.5)
Eosinophils Relative: 2 %
HCT: 34.4 % — ABNORMAL LOW (ref 36.0–46.0)
Hemoglobin: 11.7 g/dL — ABNORMAL LOW (ref 12.0–15.0)
Immature Granulocytes: 1 %
Lymphocytes Relative: 18 %
Lymphs Abs: 0.8 10*3/uL (ref 0.7–4.0)
MCH: 30.9 pg (ref 26.0–34.0)
MCHC: 34 g/dL (ref 30.0–36.0)
MCV: 90.8 fL (ref 80.0–100.0)
Monocytes Absolute: 0.2 10*3/uL (ref 0.1–1.0)
Monocytes Relative: 5 %
Neutro Abs: 3 10*3/uL (ref 1.7–7.7)
Neutrophils Relative %: 73 %
Platelets: 179 10*3/uL (ref 150–400)
RBC: 3.79 MIL/uL — ABNORMAL LOW (ref 3.87–5.11)
RDW: 12.2 % (ref 11.5–15.5)
WBC: 4.1 10*3/uL (ref 4.0–10.5)
nRBC: 0 % (ref 0.0–0.2)

## 2019-07-17 MED ORDER — PACLITAXEL PROTEIN-BOUND CHEMO INJECTION 100 MG
100.0000 mg/m2 | Freq: Once | INTRAVENOUS | Status: AC
  Administered 2019-07-17: 175 mg via INTRAVENOUS
  Filled 2019-07-17: qty 35

## 2019-07-17 MED ORDER — SODIUM CHLORIDE 0.9 % IV SOLN
Freq: Once | INTRAVENOUS | Status: AC
  Filled 2019-07-17: qty 250

## 2019-07-17 MED ORDER — HEPARIN SOD (PORK) LOCK FLUSH 100 UNIT/ML IV SOLN
500.0000 [IU] | Freq: Once | INTRAVENOUS | Status: AC | PRN
  Administered 2019-07-17: 500 [IU]
  Filled 2019-07-17: qty 5

## 2019-07-17 MED ORDER — SODIUM CHLORIDE 0.9% FLUSH
10.0000 mL | INTRAVENOUS | Status: DC | PRN
  Administered 2019-07-17: 10 mL
  Filled 2019-07-17: qty 10

## 2019-07-17 MED ORDER — PROCHLORPERAZINE MALEATE 10 MG PO TABS
10.0000 mg | ORAL_TABLET | Freq: Once | ORAL | Status: AC
  Administered 2019-07-17: 10 mg via ORAL

## 2019-07-17 NOTE — Progress Notes (Signed)
Pharmacist Chemotherapy Monitoring - Follow Up Assessment    I verify that I have reviewed each item in the below checklist:   Regimen for the patient is scheduled for the appropriate day and plan matches scheduled date.  Appropriate non-routine labs are ordered dependent on drug ordered.  If applicable, additional medications reviewed and ordered per protocol based on lifetime cumulative doses and/or treatment regimen.   Plan for follow-up and/or issues identified: No  I-vent associated with next due treatment: No  MD and/or nursing notified: No  Bridgitt Raggio K 07/17/2019 3:42 PM

## 2019-07-18 ENCOUNTER — Ambulatory Visit: Admission: RE | Admit: 2019-07-18 | Payer: Commercial Managed Care - PPO | Source: Ambulatory Visit

## 2019-07-18 ENCOUNTER — Ambulatory Visit
Admission: RE | Admit: 2019-07-18 | Discharge: 2019-07-18 | Disposition: A | Discharge status: Home / Self Care | Payer: Commercial Managed Care - PPO | Source: Ambulatory Visit | Attending: Oncology | Admitting: Oncology

## 2019-07-18 ENCOUNTER — Other Ambulatory Visit: Payer: Self-pay | Admitting: Oncology

## 2019-07-18 DIAGNOSIS — R928 Other abnormal and inconclusive findings on diagnostic imaging of breast: Secondary | ICD-10-CM

## 2019-07-18 MED ORDER — GADOBUTROL 1 MMOL/ML IV SOLN
6.0000 mL | Freq: Once | INTRAVENOUS | Status: AC | PRN
  Administered 2019-07-18: 6 mL via INTRAVENOUS

## 2019-07-19 ENCOUNTER — Encounter: Payer: Self-pay | Admitting: *Deleted

## 2019-07-24 ENCOUNTER — Other Ambulatory Visit: Payer: Self-pay

## 2019-07-24 ENCOUNTER — Inpatient Hospital Stay: Payer: Commercial Managed Care - PPO

## 2019-07-24 ENCOUNTER — Inpatient Hospital Stay: Payer: Commercial Managed Care - PPO | Attending: Oncology

## 2019-07-24 ENCOUNTER — Other Ambulatory Visit: Payer: Self-pay | Admitting: Oncology

## 2019-07-24 VITALS — BP 110/71 | HR 55 | Temp 98.0°F | Resp 18 | Ht 68.0 in | Wt 140.5 lb

## 2019-07-24 DIAGNOSIS — Z79899 Other long term (current) drug therapy: Secondary | ICD-10-CM | POA: Insufficient documentation

## 2019-07-24 DIAGNOSIS — Z171 Estrogen receptor negative status [ER-]: Secondary | ICD-10-CM | POA: Diagnosis not present

## 2019-07-24 DIAGNOSIS — Z9013 Acquired absence of bilateral breasts and nipples: Secondary | ICD-10-CM | POA: Insufficient documentation

## 2019-07-24 DIAGNOSIS — Z5111 Encounter for antineoplastic chemotherapy: Secondary | ICD-10-CM | POA: Diagnosis present

## 2019-07-24 DIAGNOSIS — C50211 Malignant neoplasm of upper-inner quadrant of right female breast: Secondary | ICD-10-CM

## 2019-07-24 DIAGNOSIS — R590 Localized enlarged lymph nodes: Secondary | ICD-10-CM | POA: Insufficient documentation

## 2019-07-24 DIAGNOSIS — Z818 Family history of other mental and behavioral disorders: Secondary | ICD-10-CM | POA: Insufficient documentation

## 2019-07-24 DIAGNOSIS — Z9079 Acquired absence of other genital organ(s): Secondary | ICD-10-CM | POA: Diagnosis not present

## 2019-07-24 DIAGNOSIS — C50412 Malignant neoplasm of upper-outer quadrant of left female breast: Secondary | ICD-10-CM | POA: Insufficient documentation

## 2019-07-24 DIAGNOSIS — Z803 Family history of malignant neoplasm of breast: Secondary | ICD-10-CM | POA: Insufficient documentation

## 2019-07-24 LAB — COMPREHENSIVE METABOLIC PANEL
ALT: 27 U/L (ref 0–44)
AST: 22 U/L (ref 15–41)
Albumin: 4.2 g/dL (ref 3.5–5.0)
Alkaline Phosphatase: 30 U/L — ABNORMAL LOW (ref 38–126)
Anion gap: 11 (ref 5–15)
BUN: 8 mg/dL (ref 6–20)
CO2: 20 mmol/L — ABNORMAL LOW (ref 22–32)
Calcium: 9.5 mg/dL (ref 8.9–10.3)
Chloride: 105 mmol/L (ref 98–111)
Creatinine, Ser: 0.76 mg/dL (ref 0.44–1.00)
GFR calc Af Amer: >60 mL/min (ref >60)
GFR calc non Af Amer: >60 mL/min (ref >60)
Glucose, Bld: 104 mg/dL — ABNORMAL HIGH (ref 70–99)
Potassium: 4.2 mmol/L (ref 3.5–5.1)
Sodium: 136 mmol/L (ref 135–145)
Total Bilirubin: 0.4 mg/dL (ref 0.3–1.2)
Total Protein: 6.8 g/dL (ref 6.5–8.1)

## 2019-07-24 LAB — CBC WITH DIFFERENTIAL/PLATELET
Abs Immature Granulocytes: 0.02 10*3/uL (ref 0.00–0.07)
Basophils Absolute: 0 10*3/uL (ref 0.0–0.1)
Basophils Relative: 1 %
Eosinophils Absolute: 0 10*3/uL (ref 0.0–0.5)
Eosinophils Relative: 2 %
HCT: 32.7 % — ABNORMAL LOW (ref 36.0–46.0)
Hemoglobin: 10.9 g/dL — ABNORMAL LOW (ref 12.0–15.0)
Immature Granulocytes: 1 %
Lymphocytes Relative: 38 %
Lymphs Abs: 0.9 10*3/uL (ref 0.7–4.0)
MCH: 30.5 pg (ref 26.0–34.0)
MCHC: 33.3 g/dL (ref 30.0–36.0)
MCV: 91.6 fL (ref 80.0–100.0)
Monocytes Absolute: 0.2 10*3/uL (ref 0.1–1.0)
Monocytes Relative: 7 %
Neutro Abs: 1.3 10*3/uL — ABNORMAL LOW (ref 1.7–7.7)
Neutrophils Relative %: 51 %
Platelets: 120 10*3/uL — ABNORMAL LOW (ref 150–400)
RBC: 3.57 MIL/uL — ABNORMAL LOW (ref 3.87–5.11)
RDW: 12.2 % (ref 11.5–15.5)
WBC: 2.5 10*3/uL — ABNORMAL LOW (ref 4.0–10.5)
nRBC: 0 % (ref 0.0–0.2)

## 2019-07-24 MED ORDER — PROCHLORPERAZINE MALEATE 10 MG PO TABS
10.0000 mg | ORAL_TABLET | Freq: Once | ORAL | Status: AC
  Administered 2019-07-24: 10 mg via ORAL

## 2019-07-24 MED ORDER — SODIUM CHLORIDE 0.9% FLUSH
10.0000 mL | INTRAVENOUS | Status: DC | PRN
  Administered 2019-07-24: 10 mL
  Filled 2019-07-24: qty 10

## 2019-07-24 MED ORDER — HEPARIN SOD (PORK) LOCK FLUSH 100 UNIT/ML IV SOLN
500.0000 [IU] | Freq: Once | INTRAVENOUS | Status: AC | PRN
  Administered 2019-07-24: 500 [IU]
  Filled 2019-07-24: qty 5

## 2019-07-24 MED ORDER — SODIUM CHLORIDE 0.9 % IV SOLN
Freq: Once | INTRAVENOUS | Status: AC
  Filled 2019-07-24: qty 250

## 2019-07-24 MED ORDER — PACLITAXEL PROTEIN-BOUND CHEMO INJECTION 100 MG
100.0000 mg/m2 | Freq: Once | INTRAVENOUS | Status: AC
  Administered 2019-07-24: 175 mg via INTRAVENOUS
  Filled 2019-07-24: qty 35

## 2019-07-24 MED ORDER — PROCHLORPERAZINE MALEATE 10 MG PO TABS
ORAL_TABLET | ORAL | Status: AC
  Filled 2019-07-24: qty 1

## 2019-07-24 NOTE — Progress Notes (Signed)
Per Dr. Jana Hakim, ok to proceed with treatment with [place] 1.3.

## 2019-07-24 NOTE — Patient Instructions (Signed)
[facility] Discharge Instructions for Patients Receiving Chemotherapy   Today you received the following chemotherapy agents Abraxane.   To help prevent nausea and vomiting after your treatment, we encourage you to take your nausea medicationas prescribed.  If you develop nausea and vomiting that is not controlled by your nausea medication, call the clinic.   BELOW ARE SYMPTOMS THAT SHOULD BE REPORTED IMMEDIATELY:  *FEVER GREATER THAN 100.5 F  *CHILLS WITH OR WITHOUT FEVER  NAUSEA AND VOMITING THAT IS NOT CONTROLLED WITH YOUR NAUSEA MEDICATION  *UNUSUAL SHORTNESS OF BREATH  *UNUSUAL BRUISING OR BLEEDING  TENDERNESS IN MOUTH AND THROAT WITH OR WITHOUT PRESENCE OF ULCERS  *URINARY PROBLEMS  *BOWEL PROBLEMS  UNUSUAL RASH Items with * indicate a potential emergency and should be followed up as soon as possible.  Feel free to call the clinic should you have any questions or concerns. The clinic phone number is [phone].  Please show the CHEMO ALERT CARD at check-in to the Emergency Department and triage nurse.  

## 2019-07-26 ENCOUNTER — Telehealth: Payer: Self-pay | Admitting: *Deleted

## 2019-07-26 ENCOUNTER — Encounter: Payer: Self-pay | Admitting: *Deleted

## 2019-07-26 NOTE — Telephone Encounter (Signed)
Pt called to relate received notification that appt for chemo was scheduled r/s from 6/8 to 6/7. Scheduling msg sent to move chemo back to 6/8.

## 2019-07-30 ENCOUNTER — Inpatient Hospital Stay: Payer: Commercial Managed Care - PPO

## 2019-07-30 ENCOUNTER — Inpatient Hospital Stay: Payer: Commercial Managed Care - PPO | Admitting: Oncology

## 2019-07-30 NOTE — Progress Notes (Signed)
[place]  Telephone:([phone]) [phone] Fax:(336) [phone]     ID: Joy Reyes DOB: Nov 07, 1985  MR#: [id number]  BSJ#:[id number]  Patient Care Team: System, Pcp Not In as PCP - General Analleli Gierke, [name], MD as Consulting Physician (Oncology) Fulton Reek, MD as Referring Physician (Obstetrics and Gynecology) [name], RN as Oncology Nurse Navigator [name], RN as Oncology Nurse Navigator [name], MD as Consulting Physician (Plastic Surgery) [name], MD OTHER MD: PCP= [name] MD  CHIEF COMPLAINT: BRCA positive breast cancer  CURRENT TREATMENT: Neoadjuvant chemotherapy   INTERVAL HISTORY: Joy Reyes" returns today for follow up and treatment of her BRCA positive breast cancer, accompanied by her husband Joy Reyes.  She started neoadjuvant chemotherapy consisting of carboplatin day 1, Abraxane days 1,8,15, repeated Q21 days x 4 cycles, on 07/10/2019. Today is day 1 cycle 2.  She was scheduled for biopsy of the right breast mass in question on 07/18/2019, but repeat MRI performed that day showed no enhancing mass to target. There was also decreased background parenchymal enhancement of right breast compared to prior on 07/06/2019.   REVIEW OF SYSTEMS: Joy Reyes did very well with her first cycle of chemotherapy.  Her hair is thinning but she still has plenty of it and it is not particularly noticeable that she has lost some.  She is now mostly "stay at home" not working, and her children are enjoying having her around.  They are of course concerned about her.  Maggie met with Dr. [name] and liked her.  Joy Reyes is thinking of not having nipple sparing mastectomies because you end up with a permanently erect nipple she says and that means she would need to wear a bra.  A detailed review of systems today was otherwise noncontributory   HISTORY OF CURRENT ILLNESS: From the original intake note:  Joy Reyes has a history of  breast cancer, diagnosed in 2012 while she lived in [place], [place], and treated in [place] under Dr [name].  We are obtaining data from that experience but according to the patient her tumor was the size of an apricot.  She did not have axillary lymph node sampling or radiation.  She remembers 8 neoadjuvant chemotherapy treatments given every 2 weeks with the first including the "red drug", which suggest Cytoxan and Adriamycin in dose dense fashion x4 most likely followed by either Taxol or Taxotere in dose dense fashion x4.  After that experience and given her breast density and BRCA mutation our practice here would have been a yearly follow-up with MRI in addition to yearly mammography.   More recently Joy Reyes herself palpated a change in her right breast.  She brought it to Dr. [name] attention and underwent bilateral diagnostic mammography with tomography and left breast ultrasonography at [facility] on 06/14/2019 showing: breast density category D; there was an area of asymmetry in the upper outer quadrant of the left breast, and ultrasound of that area showed a 2.8 cm mass.  Ultrasound of the axilla showed 1.8 cm lymph node with cortical thickening.    In the right breast they saw only postsurgical changes, with 2 biopsy clips in the retroareolar area.  Accordingly on 06/19/2019 she proceeded to biopsy of the left breast area in question. 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  The left axillary lymph node biopsy was nondiagnostic, with very spare sampling of fibroadipose and lymphoid tissue.  Estrogen receptor  was negative, with less than 1% uptake, progesterone receptor was negative, with less than 1% uptake, HER-2 was negative by immunohistochemistry with a score of 1+.  The patient's subsequent history is as detailed below.   PAST MEDICAL HISTORY: Past Medical History:  Diagnosis Date  . Cancer [facility])     breast cancer 2012    PAST SURGICAL HISTORY: Past Surgical History:  Procedure Laterality Date  . BREAST SURGERY     lumpectomy on right breast  . CESAREAN SECTION MULTI-GESTATIONAL N/A 06/01/2015   Procedure: CESAREAN SECTION MULTI-GESTATIONAL;  Surgeon: Waymon Amato, MD;  Location: [facility];  Service: Obstetrics;  Laterality: N/A;  . IR IMAGING GUIDED PORT INSERTION  07/05/2019  . LOBECTOMY Right 2012  . SALPINGECTOMY      FAMILY HISTORY: Family History  Problem Relation Age of Onset  . Breast cancer Maternal Grandmother        very late in life, lived to 33  . Breast cancer Paternal Grandmother        died at age 33  . Alzheimer's disease Paternal Grandfather     The patient's parents are both turning 33 in 2021.  The paternal grandfather died from Alzheimer's disease.  The paternal grandmother died from breast cancer at the age of 33.  The patient's father had 1 sister who had died as a child from meningitis.  He had no brothers.  On the mother side, a maternal grandmother did have breast cancer very late in life, living to be 71.  The paternal grandfather and the only maternal aunt had no history of cancer.  The patient has 1 brother, in good health, no sisters.   GYNECOLOGIC HISTORY:  No LMP recorded. Menarche: 34 years old Age at first live birth: 34 years old [place] P twins LMP regular as of May 2021 Contraceptive HRT no  Hysterectomy?  No BSO?  Status post bilateral salpingectomy, no oophorectomy   SOCIAL HISTORY: (updated May 2021)  [name] "Joy Reyes" works as a Music therapist.  Her husband Joy Reyes") works for an NGO (the [employer]) out of [place] which helps small agencies doing research set up and succeed.  Their twins are 33 years old. Joy Reyes has (2?) children from a prior marriage. The patient belongs to an orthodox denomination    ADVANCED DIRECTIVES: In the absence of any documentation to the contrary, the patient's spouse is their HCPOA.    HEALTH  MAINTENANCE: Social History   Tobacco Use  . Smoking status: Never Smoker  . Smokeless tobacco: Never Used  Substance Use Topics  . Alcohol use: No  . Drug use: No     Colonoscopy: n/a (age)  PAP: Up-to-date  Bone density: n/a (age)   Allergies  Allergen Reactions  . Paclitaxel     Anaphylactic per pt history in MD note    Current Outpatient Medications  Medication Sig Dispense Refill  . dexamethasone (DECADRON) 4 MG tablet Take 2 tablets (8 mg total) by mouth daily. Start the day after carboplatin chemotherapy (day 1 of each 21 days cycle) and continue for 2 days (days 2 and 3, counting chemotherapy day as day 1);  last dose the morning of day 4. 30 tablet 1  . lidocaine-prilocaine (EMLA) cream Apply to affected area once 30 g 3  . LORazepam (ATIVAN) 0.5 MG tablet Take 1 tablet (0.5 mg total) by mouth at bedtime as needed (Nausea or vomiting). 20 tablet 0  . prochlorperazine (COMPAZINE) 10 MG tablet Take 1 tablet (10 mg  total) by mouth every 6 (six) hours as needed (Nausea or vomiting). 30 tablet 1   No current facility-administered medications for this visit.    OBJECTIVE: White woman who appears stated age  62:   07/31/19 1130  BP: 125/80  Pulse: 65  Resp: 16  Temp: 98.3 F (36.8 C)  SpO2: 100%     Body mass index is 21.42 kg/m.   Wt Readings from Last 3 Encounters:  07/31/19 140 lb 14.4 oz (63.9 kg)  07/24/19 140 lb 8 oz (63.7 kg)  07/17/19 139 lb (63 kg)      ECOG FS:1 - Symptomatic but completely ambulatory  Sclerae unicteric, EOMs intact Wearing a mask No cervical or supraclavicular adenopathy Lungs no rales or rhonchi Heart regular rate and rhythm Abd soft, nontender, positive bowel sounds MSK no focal spinal tenderness, no upper extremity lymphedema Neuro: nonfocal, well oriented, appropriate affect Breasts: The right breast is benign.  In the left breast upper outer quadrant the movable mass is smaller and a bit softer, with the edges less  well-defined.  Both axillae are benign.   LAB RESULTS:      Component Value Date/Time   NA 136 07/24/2019 0832   K 4.2 07/24/2019 0832   CL 105 07/24/2019 0832   CO2 20 (L) 07/24/2019 0832   GLUCOSE 104 (H) 07/24/2019 0832   BUN 8 07/24/2019 0832   CREATININE 0.76 07/24/2019 0832   CALCIUM 9.5 07/24/2019 0832   PROT 6.8 07/24/2019 0832   ALBUMIN 4.2 07/24/2019 0832   AST 22 07/24/2019 0832   ALT 27 07/24/2019 0832   ALKPHOS 30 (L) 07/24/2019 0832   BILITOT 0.4 07/24/2019 0832   GFRNONAA >60 07/24/2019 0832   GFRAA >60 07/24/2019 0832    No results found for: TOTALPROTELP, ALBUMINELP, A1GS, A2GS, BETS, BETA2SER, GAMS, MSPIKE, SPEI  Lab Results  Component Value Date   WBC 2.4 (L) 07/31/2019   NEUTROABS 1.0 (L) 07/31/2019   HGB 10.8 (L) 07/31/2019   HCT 31.8 (L) 07/31/2019   MCV 92.7 07/31/2019   PLT 94 (L) 07/31/2019    No results found for: LABCA2  No components found for: LPFXTK240  No results for input(s): INR in the last 168 hours.  No results found for: LABCA2  No results found for: XBD532  No results found for: DJM426  No results found for: STM196  No results found for: CA2729  No components found for: HGQUANT  No results found for: CEA1 / No results found for: CEA1   No results found for: AFPTUMOR  No results found for: CHROMOGRNA  No results found for: KPAFRELGTCHN, LAMBDASER, KAPLAMBRATIO (kappa/lambda light chains)  No results found for: HGBA, HGBA2QUANT, HGBFQUANT, HGBSQUAN (Hemoglobinopathy evaluation)   No results found for: LDH  No results found for: IRON, TIBC, IRONPCTSAT (Iron and TIBC)  No results found for: FERRITIN  Urinalysis No results found for: COLORURINE, APPEARANCEUR, LABSPEC, PHURINE, GLUCOSEU, HGBUR, BILIRUBINUR, KETONESUR, PROTEINUR, UROBILINOGEN, NITRITE, LEUKOCYTESUR   STUDIES: CT Chest W Contrast  Result Date: 07/05/2019 CLINICAL DATA:  Breast cancer staging. EXAM: CT CHEST, ABDOMEN, AND PELVIS WITH  CONTRAST TECHNIQUE: Multidetector CT imaging of the chest, abdomen and pelvis was performed following the standard protocol during bolus administration of intravenous contrast. CONTRAST:  172m OMNIPAQUE IOHEXOL 300 MG/ML  SOLN COMPARISON:  None FINDINGS: CT CHEST FINDINGS Cardiovascular: Normal caliber thoracic aorta. Normal heart size. No pericardial effusion. No significant atherosclerotic changes. Central pulmonary vessels are unremarkable. Mediastinum/Nodes: No thoracic inlet or axillary lymphadenopathy. The clips  likely from prior biopsy in the LEFT axilla and in the LEFT and RIGHT breast. Small lymph nodes along the margin of the LEFT pectoralis muscle (image 22, series 2) 7 mm. Similar small lymph nodes in the LEFT axilla. No subpectoral adenopathy. No supraclavicular adenopathy Lungs/Pleura: Airways are patent. Lungs are clear. Musculoskeletal: Heterogeneous enhancement of the LEFT breast near biopsy clips measuring 19 mm (image 26, series 2. No internal mammary adenopathy or definable chest wall mass aside from the enhancement in the LEFT breast. Smaller areas of enhancement adjacent to this focus. This area is adjacent to a presumed biopsy clip in the central portion of the upper LEFT breast. CT ABDOMEN PELVIS FINDINGS Hepatobiliary: Liver is normal. Gallbladder is normal. No biliary ductal dilation. No focal hepatic mass. Pancreas: Pancreas is normal without focal lesion or ductal dilation. No peripancreatic inflammation. Spleen: Spleen is normal size without focal lesion. Adrenals/Urinary Tract: Adrenal glands are normal. Renal enhancement is smooth. No hydronephrosis. Urinary bladder moderately distended, unremarkable by CT. Stomach/Bowel: No acute gastrointestinal process. The appendix is normal. Stool fills much of the colon. Mixed with positive contrast. No pericolonic stranding. Vascular/Lymphatic: Normal caliber abdominal aorta. No adenopathy in the retroperitoneum. No retrocrural adenopathy. No  upper abdominal adenopathy. No visible mesenteric lymphadenopathy. No pelvic lymphadenopathy. Reproductive: Corpus luteum cyst in the RIGHT adnexa. CT appearance of pelvic viscera otherwise unremarkable. Other: No abdominal wall hernia or abnormality. No abdominopelvic ascites. Musculoskeletal: No acute bone finding. No destructive bone process. IMPRESSION: 1. Heterogeneous enhancement with nodular features in the LEFT breast and small LEFT axillary lymph nodes, less than a cm as described. 2. No evidence of metastatic disease in the abdomen or pelvis. Electronically Signed   By: Zetta Bills M.D.   On: 07/05/2019 15:38   NM Bone Scan Whole Body  Result Date: 07/13/2019 CLINICAL DATA:  Breast cancer, staging breast cancer staging . EXAM: NUCLEAR MEDICINE WHOLE BODY BONE SCAN TECHNIQUE: Whole body anterior and posterior images were obtained approximately 3 hours after intravenous injection of radiopharmaceutical. RADIOPHARMACEUTICALS:  19.3 mCi Technetium-77mMDP IV COMPARISON:  CT 07/05/2019. FINDINGS: Bilateral renal function excretion. No focal bony abnormality identified. No evidence of metastatic disease. IMPRESSION: Negative exam.  No evidence of metastatic disease. Electronically Signed   By: TMarcello Moores Register   On: 07/13/2019 14:25   CT Abdomen Pelvis W Contrast  Result Date: 07/05/2019 CLINICAL DATA:  Breast cancer staging. EXAM: CT CHEST, ABDOMEN, AND PELVIS WITH CONTRAST TECHNIQUE: Multidetector CT imaging of the chest, abdomen and pelvis was performed following the standard protocol during bolus administration of intravenous contrast. CONTRAST:  1060mOMNIPAQUE IOHEXOL 300 MG/ML  SOLN COMPARISON:  None FINDINGS: CT CHEST FINDINGS Cardiovascular: Normal caliber thoracic aorta. Normal heart size. No pericardial effusion. No significant atherosclerotic changes. Central pulmonary vessels are unremarkable. Mediastinum/Nodes: No thoracic inlet or axillary lymphadenopathy. The clips likely from prior  biopsy in the LEFT axilla and in the LEFT and RIGHT breast. Small lymph nodes along the margin of the LEFT pectoralis muscle (image 22, series 2) 7 mm. Similar small lymph nodes in the LEFT axilla. No subpectoral adenopathy. No supraclavicular adenopathy Lungs/Pleura: Airways are patent. Lungs are clear. Musculoskeletal: Heterogeneous enhancement of the LEFT breast near biopsy clips measuring 19 mm (image 26, series 2. No internal mammary adenopathy or definable chest wall mass aside from the enhancement in the LEFT breast. Smaller areas of enhancement adjacent to this focus. This area is adjacent to a presumed biopsy clip in the central portion of the upper LEFT breast.  CT ABDOMEN PELVIS FINDINGS Hepatobiliary: Liver is normal. Gallbladder is normal. No biliary ductal dilation. No focal hepatic mass. Pancreas: Pancreas is normal without focal lesion or ductal dilation. No peripancreatic inflammation. Spleen: Spleen is normal size without focal lesion. Adrenals/Urinary Tract: Adrenal glands are normal. Renal enhancement is smooth. No hydronephrosis. Urinary bladder moderately distended, unremarkable by CT. Stomach/Bowel: No acute gastrointestinal process. The appendix is normal. Stool fills much of the colon. Mixed with positive contrast. No pericolonic stranding. Vascular/Lymphatic: Normal caliber abdominal aorta. No adenopathy in the retroperitoneum. No retrocrural adenopathy. No upper abdominal adenopathy. No visible mesenteric lymphadenopathy. No pelvic lymphadenopathy. Reproductive: Corpus luteum cyst in the RIGHT adnexa. CT appearance of pelvic viscera otherwise unremarkable. Other: No abdominal wall hernia or abnormality. No abdominopelvic ascites. Musculoskeletal: No acute bone finding. No destructive bone process. IMPRESSION: 1. Heterogeneous enhancement with nodular features in the LEFT breast and small LEFT axillary lymph nodes, less than a cm as described. 2. No evidence of metastatic disease in the  abdomen or pelvis. Electronically Signed   By: Zetta Bills M.D.   On: 07/05/2019 15:38   MR BREAST RIGHT W WO CONTRAST INC CAD  Result Date: 07/18/2019 CLINICAL DATA:  34 year old premenopausal patient with recent diagnosis left breast cancer, planning bilateral mastectomies. Known BRCA 1 mutation. On recent bilateral breast MRI there was a mass described in the upper inner right breast recommended for MRI guided biopsy. The patient had marked background parenchymal enhancement pattern on the recent breast MRI. Today, she is having her menstrual cycle and recently completed her second cycle of chemotherapy. LABS:  None obtained EXAM: MR OF THE RIGHT BREAST WITH AND WITHOUT CONTRAST TECHNIQUE: Multiplanar, multisequence MR images of the right breast were obtained prior to and following the intravenous administration of 6 ml of Gadavist. Three-dimensional MR images were rendered by post-processing of the original MR data on an independent workstation. The three-dimensional MR images were interpreted, and findings are reported in the following complete MRI report for this study. Three dimensional images were evaluated at the independent DynaCad workstation COMPARISON:  Recent MRI of the breast Jul 06, 2019 FINDINGS: Breast composition: c. Heterogeneous fibroglandular tissue. Background parenchymal enhancement: Moderate. Right breast: The background parenchymal enhancement pattern is significantly decreased on today's MRI, and much less nodular today compared to Jul 06, 2019. No focal mass is identified in the right breast to suggest malignancy and there is no target for biopsy. Lymph nodes: No abnormal appearing lymph nodes. Ancillary findings:  None. IMPRESSION: Decreased background parenchymal enhancement of the right breast compared to recent prior on Jul 06, 2019 in this premenopausal patient. There is no enhancing mass to target for biopsy. No suspicious findings. RECOMMENDATION: Continued treatment  planning for known left breast cancer. Patient plans for bilateral mastectomies, and therefore no further imaging follow-up is suggested unless new areas of concern arise. BI-RADS CATEGORY  1: Negative. Electronically Signed   By: Curlene Dolphin M.D.   On: 07/18/2019 10:53   MR BREAST BILATERAL W WO CONTRAST INC CAD  Result Date: 07/09/2019 CLINICAL DATA:  Recent the diagnosed left breast cancer. LABS:  None avail EXAM: BILATERAL BREAST MRI WITH AND WITHOUT CONTRAST TECHNIQUE: Multiplanar, multisequence MR images of both breasts were obtained prior to and following the intravenous administration of 6 ml of Gadavist Three-dimensional MR images were rendered by post-processing of the original MR data on an independent workstation. The three-dimensional MR images were interpreted, and findings are reported in the following complete MRI report for this study. Three  dimensional images were evaluated at the independent DynaCad workstation COMPARISON:  MRI of the breast June 07, 2012. Mammography from April 2021. FINDINGS: Breast composition: c. Heterogeneous fibroglandular tissue. Background parenchymal enhancement: Marked Right breast: Distortion in the central right breast correlates with a previous biopsy and is consistent with biopsy change. There is diffuse background enhancement on the right limiting evaluation for small masses. There are multiple similar appearing foci and small masses throughout the right breast which demonstrate persistent kinetics and are considered benign. There is a mass in the superior right breast located between 12 o'clock and 1 o'clock measuring 5.3 mm on series 6, image 52 which demonstrates plateau kinetics. No other suspicious masses are seen in the right breast. Left breast: The patient's known malignancy is identified in the upper outer quadrant of the left breast measuring 2.8 by 3.0 by 2.7 cm in transverse, AP, and craniocaudal dimensions. There is no involvement of the  underlying pectoralis muscle identified. A small mass in the upper outer left breast which is T2 bright, seen on series 6, image 78 is consistent with an intramammary lymph node. There is an indeterminate mass at 9 o'clock in the left breast on series 6, image 86 measuring 5 mm which demonstrates plateau kinetics. No other suspicious findings seen in the left breast. Lymph nodes: The previously biopsied left axillary node is again identified are mildly prominent. No other suspicious nodes are identified on today's study. Ancillary findings:  None. IMPRESSION: 1. The patient's known malignancy measures 2.8 x 3.0 x 2.7 cm in the upper outer quadrant the left breast. 2. There is an indeterminate 5 mm mass in the medial left breast at 9 o'clock which demonstrates plateau kinetics and is indeterminate. 3. There is an indeterminate 5.3 mm mass in the superior right breast between 12 and 1 o'clock which demonstrates plateau kinetics. 4. There is marked background enhancement throughout the remainder of both breasts with numerous foci and small masses demonstrating persistent kinetics, consistent with a benign etiology. 5. The previously biopsied left axillary lymph node is mildly prominent and contains a biopsy clip. No other evidence of adenopathy. RECOMMENDATION: The patient is scheduled to return for a re-biopsy of a left axillary lymph node. The patient is pursuing bilateral mastectomies. As a result, no additional biopsies are necessary of the left breast. Recommend MRI guided biopsy of the superiorly located right breast mass. These findings and recommendations were called to [facility] in the cancer center. BI-RADS CATEGORY  4: Suspicious. Electronically Signed   By: Dorise Bullion III M.D   On: 07/09/2019 13:17   [place] AXILLARY NODE CORE BIOPSY LEFT  Addendum Date: 07/13/2019   ADDENDUM REPORT: 07/13/2019 13:58 ADDENDUM: Pathology revealed BENIGN FIBROADIPOSE TISSUE. NO LYMPHOID TISSUE PRESENT of the LEFT axillary  lymph node. This was found to be discordant by Dr. Curlene Dolphin. Pathology results were discussed with the patient by telephone. The patient reported doing well after the biopsy with tenderness at the site. Post biopsy instructions and care were reviewed and questions were answered. The patient was encouraged to call [facility] for any additional concerns. Given no diagnostic sample, correlate with sentinel node biopsy is recommended. Patient is scheduled for MRI guided biopsy of the superiorly located RIGHT breast mass on 07/18/19. Pathology results reported by Stacie Acres RN on 07/13/2019. Electronically Signed   By: Curlene Dolphin M.D.   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 This was diagnosed at [facility] following ultrasound-guided core needle biopsy a left breast mass. An ultrasound of the left axilla showed a lymph node measuring 1.8 x 1.3 x 0.7 cm, with cortical thickness approximately 2.9 mm when I reviewed the images. This was biopsied under ultrasound guidance in [place] and the pathology reported nondiagnostic samples. She reports she has had her first round of chemotherapy. EXAM: [place] AXILLARY NODE CORE BIOPSY LEFT COMPARISON:  Previous exam(s). PROCEDURE: I met with the patient and we discussed the procedure of ultrasound-guided biopsy, including benefits and alternatives. We discussed the high likelihood of a successful procedure. We discussed the risks of the procedure, including infection, bleeding, tissue injury, clip migration, and inadequate sampling. Informed written consent was given. The usual time-out protocol was performed immediately prior to the procedure. Three similar appearing axillary lymph nodes are identified. None of these lymph nodes currently appears or measures as  large as the lymph node that was measured on the left axillary ultrasound from June 19, 2019. Using sterile technique and 1% Lidocaine as local anesthetic, under direct ultrasound visualization, a 14 gauge spring-loaded device was used to perform biopsy of what was thought to be the lymph node previously biopsied on 06/19/2019, measuring approximately 1.5 x 0.7 x 0.7 cm, with cortical thickness of 2.5 mm. Today one sample of this lymph node was performed using a lateral approach. The biopsy is technically difficult due to the deep position of the lymph node, the mobility of the lymph node and the adjacent musculature. Therefore, I performed 3 additional biopsies of the lymph node slightly lateral to it, which has a similar appearance and measures 1.0 cm x 0.9 x 0.4 cm, with cortical thickness of 2.5 mm. At the conclusion of the procedure a Q clip tissue marker clip was deployed into the biopsy cavity. Follow up 2 view mammogram was performed and dictated separately. IMPRESSION: Ultrasound guided biopsy of left axillary lymph node(s). No apparent complications. Electronically Signed: By: Curlene Dolphin M.D. On: 07/12/2019 12:42   MM CLIP PLACEMENT LEFT  Result Date: 07/12/2019 CLINICAL DATA:  Ultrasound second ultrasound-guided core needle biopsy of left axillary lymph node performed today. EXAM: DIAGNOSTIC LEFT MAMMOGRAM POST ULTRASOUND BIOPSY COMPARISON:  Previous exam(s). FINDINGS: Mammographic images were obtained following ultrasound guided biopsy of left axillary lymph node(s). The biopsy marking clip is in expected position at the site of biopsy. The Q shaped biopsy clip placed today is 1.2 cm inferior to the more circular biopsy clip placed at an outside institution recently. IMPRESSION: Appropriate positioning of the Q shaped biopsy marking clip at the site of biopsy in the left axilla. Final Assessment: Post Procedure Mammograms for Marker Placement Electronically Signed   By: Curlene Dolphin M.D.   On:  07/12/2019 12:43   IR IMAGING GUIDED PORT INSERTION  Result Date: 07/05/2019 CLINICAL DATA:  Left breast cancer EXAM: RIGHT INTERNAL JUGULAR SINGLE LUMEN POWER PORT CATHETER INSERTION Date:  07/05/2019 07/05/2019 2:37 pm Radiologist:  M. Daryll Brod, MD Guidance:  Ultrasound and fluoroscopic MEDICATIONS: Ancef 2 g; The antibiotic was administered within an appropriate time interval prior to skin puncture. ANESTHESIA/SEDATION: Versed 4.0 mg IV; Fentanyl 100 mcg IV; Moderate Sedation Time:  33 minutes The patient was continuously monitored during the procedure by the interventional radiology nurse under my direct supervision. FLUOROSCOPY TIME:  One minutes, 0 seconds (3 mGy) COMPLICATIONS: None immediate. CONTRAST:  None.  PROCEDURE: Informed consent was obtained from the patient following explanation of the procedure, risks, benefits and alternatives. The patient understands, agrees and consents for the procedure. All questions were addressed. A time out was performed. Maximal barrier sterile technique utilized including caps, mask, sterile gowns, sterile gloves, large sterile drape, hand hygiene, and 2% chlorhexidine scrub. Under sterile conditions and local anesthesia, right internal jugular micropuncture venous access was performed. Access was performed with ultrasound. Images were obtained for documentation. A guide wire was inserted followed by a transitional dilator. This allowed insertion of a guide wire and catheter into the IVC. Measurements were obtained from the SVC / RA junction back to the right IJ venotomy site. In the right infraclavicular chest, a subcutaneous pocket was created over the second anterior rib. This was done under sterile conditions and local anesthesia. 1% lidocaine with epinephrine was utilized for this. A 2.5 cm incision was made in the skin. Blunt dissection was performed to create a subcutaneous pocket over the right pectoralis major muscle. The pocket was flushed with saline  vigorously. There was adequate hemostasis. The port catheter was assembled and checked for leakage. The port catheter was secured in the pocket with two retention sutures. The tubing was tunneled subcutaneously to the right venotomy site and inserted into the SVC/RA junction through a valved peel-away sheath. Position was confirmed with fluoroscopy. Images were obtained for documentation. The patient tolerated the procedure well. No immediate complications. Incisions were closed in a two layer fashion with 4 - 0 Vicryl suture. Dermabond was applied to the skin. The port catheter was accessed, blood was aspirated followed by saline and heparin flushes. Needle was removed. A dry sterile dressing was applied. IMPRESSION: Ultrasound and fluoroscopically guided right internal jugular single lumen power port catheter insertion. Tip in the SVC/RA junction. Catheter ready for use. Electronically Signed   By: Jerilynn Mages.  Shick M.D.   On: 07/05/2019 14:55    ELIGIBLE FOR AVAILABLE RESEARCH PROTOCOL: [id number]?  ASSESSMENT: 34 y.o. BRCA positive [facility] woman  (0) BRCA positivity:  (a) s/p bilateral salpingectomy  (b) pelvic ultrasound including transvaginal study 06/01/2019 unremarkable, as was CA 125  (c) planning on bilateral mastectomies  (d) bilateral salpingectomy 11/28/2013, age 40 (no oophorectomy)  RIGHT BREAST CANCER (1) Status post right lumpectomy 2012 for a [        ]  (a) received neoadjuvant chemotherapy consisting of 4 cycles of doxorubicin and cyclophosphamide, followed by paclitaxel x1 (developed anaphylaxis with the first dose), then 4 doses of docetaxel completed August 2012  (b) [no adjuvant radiation]  LEFT BREAST CANCER (2) Status post left breast upper outer quadrant biopsy 06/19/2019 for a clinical T2 N1, stage IIIB invasive ductal carcinoma, grade 3, triple negative  (a) left axillary lymph node biopsy 07/12/2019 nondiagnostic [discordant]  (b) chest abdomen and pelvis CT scan 07/05/2019  shows small left axillary lymph nodes, 1.5 cm enhancement in the left breast, no internal mammary adenopathy, no evidence of metastatic disease  (c) bone scan 07/13/2019 shows no evidence of bony metastatic disease  (d) biopsy of RIGHT breast mass 07/18/2019  (3) neoadjuvant chemotherapy consisting of carboplatin day 1, Abraxane days 1,8,15, repeated Q21 days x 4 cycles, starting 07/10/2019  (4) definitive surgery to follow  (5) adjuvant radiation as appropriate  (6) consider adjuvant pembrolizumab or PARP inhibitor   PLAN: Maggie did well with her first chemotherapy cycle and was scheduled to start cycle 2 today but her Busby is only 1.0.  Her platelets are little under  100,000.  I do not think I can treat her with carboplatin and Taxol with these counts.  She is going to need Neupogen support.  We could give this on days 3 4 and 5 after each chemotherapy dose and I think this will allow [place] to continue her treatments on time, which is of course important from a curative point of view.  I am trying to get the Neupogen or bio similar approved today so she can receive a dose today and the next 2 or 3 days.  She will need to be scheduled after each cycle.  She would prefer to give the injection to herself, which certainly would be more convenient for her, allowing her for example to go to the beach on weekends.  That we are requesting from the insurance also  In any case she will see [place] again next week.  She knows to call for any other issue that may develop before then  Total encounter time 35 minutes.Sarajane Jews C. Zhanna Melin, MD 07/31/2019 11:59 AM Medical Oncology and Hematology [facility] [place], [place] [address] Tel. [phone]    Fax. [phone]  ADDENDUM: We can use Zarxio according to her insurance.  She will receive it 3 times this week and she will receive it on days 3 4 and 5 after each of her next 9 chemotherapy doses.  We are trying to find out  if she will be able to give it to herself which would save her a lot of trips here.     This document serves as a record of services personally performed by Lurline Del, MD. It was created on his behalf by Wilburn Mylar, a trained medical scribe. The creation of this record is based on the scribe's personal observations and the provider's statements to them.   I, Lurline Del MD, have reviewed the above documentation for accuracy and completeness, and I agree with the above.   *Total Encounter Time as defined by the Centers for Medicare and Medicaid Services includes, in addition to the face-to-face time of a patient visit (documented in the note above) non-face-to-face time: obtaining and reviewing outside history, ordering and reviewing medications, tests or procedures, care coordination (communications with other health care professionals or caregivers) and documentation in the medical record.

## 2019-07-31 ENCOUNTER — Telehealth: Payer: Self-pay | Admitting: Adult Health

## 2019-07-31 ENCOUNTER — Other Ambulatory Visit: Payer: Self-pay

## 2019-07-31 ENCOUNTER — Inpatient Hospital Stay (HOSPITAL_BASED_OUTPATIENT_CLINIC_OR_DEPARTMENT_OTHER): Payer: Commercial Managed Care - PPO | Admitting: Oncology

## 2019-07-31 ENCOUNTER — Inpatient Hospital Stay: Payer: Commercial Managed Care - PPO

## 2019-07-31 ENCOUNTER — Telehealth: Payer: Self-pay | Admitting: Oncology

## 2019-07-31 VITALS — BP 125/80 | HR 65 | Temp 98.3°F | Resp 16 | Ht 68.0 in | Wt 140.9 lb

## 2019-07-31 DIAGNOSIS — C50412 Malignant neoplasm of upper-outer quadrant of left female breast: Secondary | ICD-10-CM

## 2019-07-31 DIAGNOSIS — Z171 Estrogen receptor negative status [ER-]: Secondary | ICD-10-CM

## 2019-07-31 DIAGNOSIS — C50211 Malignant neoplasm of upper-inner quadrant of right female breast: Secondary | ICD-10-CM

## 2019-07-31 DIAGNOSIS — C50911 Malignant neoplasm of unspecified site of right female breast: Secondary | ICD-10-CM | POA: Diagnosis not present

## 2019-07-31 DIAGNOSIS — Z1501 Genetic susceptibility to malignant neoplasm of breast: Secondary | ICD-10-CM

## 2019-07-31 DIAGNOSIS — Z95828 Presence of other vascular implants and grafts: Secondary | ICD-10-CM

## 2019-07-31 DIAGNOSIS — Z5111 Encounter for antineoplastic chemotherapy: Secondary | ICD-10-CM | POA: Diagnosis not present

## 2019-07-31 DIAGNOSIS — O30009 Twin pregnancy, unspecified number of placenta and unspecified number of amniotic sacs, unspecified trimester: Secondary | ICD-10-CM | POA: Diagnosis not present

## 2019-07-31 LAB — CBC WITH DIFFERENTIAL/PLATELET
Abs Immature Granulocytes: 0.01 10*3/uL (ref 0.00–0.07)
Basophils Absolute: 0 10*3/uL (ref 0.0–0.1)
Basophils Relative: 1 %
Eosinophils Absolute: 0 10*3/uL (ref 0.0–0.5)
Eosinophils Relative: 0 %
HCT: 31.8 % — ABNORMAL LOW (ref 36.0–46.0)
Hemoglobin: 10.8 g/dL — ABNORMAL LOW (ref 12.0–15.0)
Immature Granulocytes: 0 %
Lymphocytes Relative: 49 %
Lymphs Abs: 1.2 10*3/uL (ref 0.7–4.0)
MCH: 31.5 pg (ref 26.0–34.0)
MCHC: 34 g/dL (ref 30.0–36.0)
MCV: 92.7 fL (ref 80.0–100.0)
Monocytes Absolute: 0.2 10*3/uL (ref 0.1–1.0)
Monocytes Relative: 7 %
Neutro Abs: 1 10*3/uL — ABNORMAL LOW (ref 1.7–7.7)
Neutrophils Relative %: 43 %
Platelets: 94 10*3/uL — ABNORMAL LOW (ref 150–400)
RBC: 3.43 MIL/uL — ABNORMAL LOW (ref 3.87–5.11)
RDW: 12.9 % (ref 11.5–15.5)
WBC: 2.4 10*3/uL — ABNORMAL LOW (ref 4.0–10.5)
nRBC: 0 % (ref 0.0–0.2)

## 2019-07-31 LAB — COMPREHENSIVE METABOLIC PANEL
ALT: 31 U/L (ref 0–44)
AST: 24 U/L (ref 15–41)
Albumin: 4.5 g/dL (ref 3.5–5.0)
Alkaline Phosphatase: 31 U/L — ABNORMAL LOW (ref 38–126)
Anion gap: 12 (ref 5–15)
BUN: 10 mg/dL (ref 6–20)
CO2: 21 mmol/L — ABNORMAL LOW (ref 22–32)
Calcium: 9.6 mg/dL (ref 8.9–10.3)
Chloride: 107 mmol/L (ref 98–111)
Creatinine, Ser: 0.77 mg/dL (ref 0.44–1.00)
GFR calc Af Amer: >60 mL/min (ref >60)
GFR calc non Af Amer: >60 mL/min (ref >60)
Glucose, Bld: 98 mg/dL (ref 70–99)
Potassium: 4.1 mmol/L (ref 3.5–5.1)
Sodium: 140 mmol/L (ref 135–145)
Total Bilirubin: 0.6 mg/dL (ref 0.3–1.2)
Total Protein: 7.1 g/dL (ref 6.5–8.1)

## 2019-07-31 MED ORDER — FILGRASTIM-SNDZ 480 MCG/0.8ML IJ SOSY
PREFILLED_SYRINGE | INTRAMUSCULAR | Status: AC
  Filled 2019-07-31: qty 0.8

## 2019-07-31 MED ORDER — FILGRASTIM-SNDZ 480 MCG/0.8ML IJ SOSY
480.0000 ug | PREFILLED_SYRINGE | Freq: Once | INTRAMUSCULAR | Status: AC
  Administered 2019-07-31: 480 ug via SUBCUTANEOUS

## 2019-07-31 MED ORDER — SODIUM CHLORIDE 0.9% FLUSH
10.0000 mL | INTRAVENOUS | Status: DC | PRN
  Administered 2019-07-31: 10 mL via INTRAVENOUS
  Filled 2019-07-31: qty 10

## 2019-07-31 NOTE — Addendum Note (Signed)
Addended by: Carlene Coria L on: 07/31/2019 01:14 PM   Modules accepted: Orders

## 2019-07-31 NOTE — Telephone Encounter (Signed)
Scheduled appts per 6/8 los. Pt declined print out of AVS and stated she would refer to [facility].

## 2019-08-01 ENCOUNTER — Inpatient Hospital Stay: Payer: Commercial Managed Care - PPO

## 2019-08-01 ENCOUNTER — Other Ambulatory Visit: Payer: Self-pay

## 2019-08-01 VITALS — BP 128/72 | HR 68 | Temp 98.2°F | Resp 18

## 2019-08-01 DIAGNOSIS — C50911 Malignant neoplasm of unspecified site of right female breast: Secondary | ICD-10-CM

## 2019-08-01 DIAGNOSIS — O30009 Twin pregnancy, unspecified number of placenta and unspecified number of amniotic sacs, unspecified trimester: Secondary | ICD-10-CM

## 2019-08-01 DIAGNOSIS — Z5111 Encounter for antineoplastic chemotherapy: Secondary | ICD-10-CM | POA: Diagnosis not present

## 2019-08-01 MED ORDER — FILGRASTIM-SNDZ 480 MCG/0.8ML IJ SOSY
480.0000 ug | PREFILLED_SYRINGE | Freq: Once | INTRAMUSCULAR | Status: AC
  Administered 2019-08-01: 480 ug via SUBCUTANEOUS

## 2019-08-01 MED ORDER — FILGRASTIM-SNDZ 480 MCG/0.8ML IJ SOSY
PREFILLED_SYRINGE | INTRAMUSCULAR | Status: AC
  Filled 2019-08-01: qty 0.8

## 2019-08-01 NOTE — Patient Instructions (Signed)
Tbo-Filgrastim injection What is this medicine? TBO-FILGRASTIM (T B O fil GRA stim) is a granulocyte colony-stimulating factor that helps you make more neutrophils, a type of white blood cell. Neutrophils are important for fighting infections. Some chemotherapy affects your bone marrow and lowers your neutrophils. This medicine helps decrease the length of time that neutrophils are very low (severe neutropenia). This medicine may be used for other purposes; ask your health care provider or pharmacist if you have questions. COMMON BRAND NAME(S): Granix What should I tell my health care provider before I take this medicine? They need to know if you have any of these conditions:  bone scan or tests planned  kidney disease  sickle cell anemia  an unusual or allergic reaction to tbo-filgrastim, filgrastim, pegfilgrastim, other medicines, foods, dyes, or preservatives  pregnant or trying to get pregnant  breast-feeding How should I use this medicine? This medicine is for injection under the skin. If you get this medicine at home, you will be taught how to prepare and give this medicine. Refer to the Instructions for Use that come with your medication packaging. Use exactly as directed. Take your medicine at regular intervals. Do not take your medicine more often than directed. It is important that you put your used needles and syringes in a special sharps container. Do not put them in a trash can. If you do not have a sharps container, call your pharmacist or healthcare provider to get one. Talk to your pediatrician regarding the use of this medicine in children. While this drug may be prescribed for children as young as 1 month of age for selected conditions, precautions do apply. Overdosage: If you think you have taken too much of this medicine contact a poison control center or emergency room at once. NOTE: This medicine is only for you. Do not share this medicine with others. What if I miss a  dose? It is important not to miss your dose. Call your doctor or health care professional if you miss a dose. What may interact with this medicine? This medicine may interact with the following medications:  medicines that may cause a release of neutrophils, such as lithium This list may not describe all possible interactions. Give your health care provider a list of all the medicines, herbs, non-prescription drugs, or dietary supplements you use. Also tell them if you smoke, drink alcohol, or use illegal drugs. Some items may interact with your medicine. What should I watch for while using this medicine? You may need blood work done while you are taking this medicine. What side effects may I notice from receiving this medicine? Side effects that you should report to your doctor or health care professional as soon as possible:  allergic reactions like skin rash, itching or hives, swelling of the face, lips, or tongue  back pain  blood in the urine  dark urine  dizziness  fast heartbeat  feeling faint  shortness of breath or breathing problems  signs and symptoms of infection like fever or chills; cough; or sore throat  signs and symptoms of kidney injury like trouble passing urine or change in the amount of urine  stomach or side pain, or pain at the shoulder  sweating  swelling of the legs, ankles, or abdomen  tiredness Side effects that usually do not require medical attention (report to your doctor or health care professional if they continue or are bothersome):  bone pain  diarrhea  headache  muscle pain  vomiting This list may   not describe all possible side effects. Call your doctor for medical advice about side effects. You may report side effects to FDA at 1-800-FDA-1088. Where should I keep my medicine? Keep out of the reach of children. Store in a refrigerator between 2 and 8 degrees C (36 and 46 degrees F). Keep in carton to protect from light. Throw  away this medicine if it is left out of the refrigerator for more than 5 consecutive days. Throw away any unused medicine after the expiration date. NOTE: This sheet is a summary. It may not cover all possible information. If you have questions about this medicine, talk to your doctor, pharmacist, or health care provider.  2020 Elsevier/Gold Standard (2017-12-10 19:58:39)  

## 2019-08-02 ENCOUNTER — Inpatient Hospital Stay: Payer: Commercial Managed Care - PPO

## 2019-08-02 ENCOUNTER — Other Ambulatory Visit: Payer: Self-pay

## 2019-08-02 VITALS — BP 122/72 | HR 68 | Temp 98.2°F | Resp 18

## 2019-08-02 DIAGNOSIS — Z1501 Genetic susceptibility to malignant neoplasm of breast: Secondary | ICD-10-CM

## 2019-08-02 DIAGNOSIS — C50911 Malignant neoplasm of unspecified site of right female breast: Secondary | ICD-10-CM

## 2019-08-02 DIAGNOSIS — Z5111 Encounter for antineoplastic chemotherapy: Secondary | ICD-10-CM | POA: Diagnosis not present

## 2019-08-02 DIAGNOSIS — O30009 Twin pregnancy, unspecified number of placenta and unspecified number of amniotic sacs, unspecified trimester: Secondary | ICD-10-CM

## 2019-08-02 MED ORDER — FILGRASTIM-SNDZ 480 MCG/0.8ML IJ SOSY
PREFILLED_SYRINGE | INTRAMUSCULAR | Status: AC
  Filled 2019-08-02: qty 0.8

## 2019-08-02 MED ORDER — FILGRASTIM-SNDZ 480 MCG/0.8ML IJ SOSY
480.0000 ug | PREFILLED_SYRINGE | Freq: Once | INTRAMUSCULAR | Status: AC
  Administered 2019-08-02: 480 ug via SUBCUTANEOUS

## 2019-08-02 NOTE — Patient Instructions (Signed)
Tbo-Filgrastim injection What is this medicine? TBO-FILGRASTIM (T B O fil GRA stim) is a granulocyte colony-stimulating factor that helps you make more neutrophils, a type of white blood cell. Neutrophils are important for fighting infections. Some chemotherapy affects your bone marrow and lowers your neutrophils. This medicine helps decrease the length of time that neutrophils are very low (severe neutropenia). This medicine may be used for other purposes; ask your health care provider or pharmacist if you have questions. COMMON BRAND NAME(S): Granix What should I tell my health care provider before I take this medicine? They need to know if you have any of these conditions:  bone scan or tests planned  kidney disease  sickle cell anemia  an unusual or allergic reaction to tbo-filgrastim, filgrastim, pegfilgrastim, other medicines, foods, dyes, or preservatives  pregnant or trying to get pregnant  breast-feeding How should I use this medicine? This medicine is for injection under the skin. If you get this medicine at home, you will be taught how to prepare and give this medicine. Refer to the Instructions for Use that come with your medication packaging. Use exactly as directed. Take your medicine at regular intervals. Do not take your medicine more often than directed. It is important that you put your used needles and syringes in a special sharps container. Do not put them in a trash can. If you do not have a sharps container, call your pharmacist or healthcare provider to get one. Talk to your pediatrician regarding the use of this medicine in children. While this drug may be prescribed for children as young as 1 month of age for selected conditions, precautions do apply. Overdosage: If you think you have taken too much of this medicine contact a poison control center or emergency room at once. NOTE: This medicine is only for you. Do not share this medicine with others. What if I miss a  dose? It is important not to miss your dose. Call your doctor or health care professional if you miss a dose. What may interact with this medicine? This medicine may interact with the following medications:  medicines that may cause a release of neutrophils, such as lithium This list may not describe all possible interactions. Give your health care provider a list of all the medicines, herbs, non-prescription drugs, or dietary supplements you use. Also tell them if you smoke, drink alcohol, or use illegal drugs. Some items may interact with your medicine. What should I watch for while using this medicine? You may need blood work done while you are taking this medicine. What side effects may I notice from receiving this medicine? Side effects that you should report to your doctor or health care professional as soon as possible:  allergic reactions like skin rash, itching or hives, swelling of the face, lips, or tongue  back pain  blood in the urine  dark urine  dizziness  fast heartbeat  feeling faint  shortness of breath or breathing problems  signs and symptoms of infection like fever or chills; cough; or sore throat  signs and symptoms of kidney injury like trouble passing urine or change in the amount of urine  stomach or side pain, or pain at the shoulder  sweating  swelling of the legs, ankles, or abdomen  tiredness Side effects that usually do not require medical attention (report to your doctor or health care professional if they continue or are bothersome):  bone pain  diarrhea  headache  muscle pain  vomiting This list may   not describe all possible side effects. Call your doctor for medical advice about side effects. You may report side effects to FDA at 1-800-FDA-1088. Where should I keep my medicine? Keep out of the reach of children. Store in a refrigerator between 2 and 8 degrees C (36 and 46 degrees F). Keep in carton to protect from light. Throw  away this medicine if it is left out of the refrigerator for more than 5 consecutive days. Throw away any unused medicine after the expiration date. NOTE: This sheet is a summary. It may not cover all possible information. If you have questions about this medicine, talk to your doctor, pharmacist, or health care provider.  2020 Elsevier/Gold Standard (2017-12-10 19:58:39)  

## 2019-08-06 ENCOUNTER — Other Ambulatory Visit: Payer: Self-pay | Admitting: *Deleted

## 2019-08-06 MED ORDER — ZARXIO 480 MCG/0.8ML IJ SOSY: 480.0000 ug | PREFILLED_SYRINGE | Freq: Every day | INTRAMUSCULAR | 0 refills | Status: DC | PRN

## 2019-08-06 NOTE — Telephone Encounter (Signed)
This RN obtained signed prescription for zarxio for home use post chemo and faxed to [facility].  Will await to see if prior authorization needed for home use for this medication.

## 2019-08-07 ENCOUNTER — Other Ambulatory Visit: Payer: Commercial Managed Care - PPO

## 2019-08-07 ENCOUNTER — Inpatient Hospital Stay: Payer: Commercial Managed Care - PPO

## 2019-08-07 ENCOUNTER — Other Ambulatory Visit: Payer: Self-pay | Admitting: Oncology

## 2019-08-07 ENCOUNTER — Telehealth: Payer: Self-pay | Admitting: *Deleted

## 2019-08-07 ENCOUNTER — Ambulatory Visit: Payer: Commercial Managed Care - PPO

## 2019-08-07 ENCOUNTER — Other Ambulatory Visit: Payer: Self-pay

## 2019-08-07 VITALS — BP 121/79 | HR 85 | Temp 98.2°F | Resp 18 | Ht 68.0 in | Wt 138.0 lb

## 2019-08-07 DIAGNOSIS — C50211 Malignant neoplasm of upper-inner quadrant of right female breast: Secondary | ICD-10-CM

## 2019-08-07 DIAGNOSIS — Z171 Estrogen receptor negative status [ER-]: Secondary | ICD-10-CM

## 2019-08-07 DIAGNOSIS — Z1501 Genetic susceptibility to malignant neoplasm of breast: Secondary | ICD-10-CM

## 2019-08-07 DIAGNOSIS — C50412 Malignant neoplasm of upper-outer quadrant of left female breast: Secondary | ICD-10-CM

## 2019-08-07 DIAGNOSIS — C50911 Malignant neoplasm of unspecified site of right female breast: Secondary | ICD-10-CM

## 2019-08-07 DIAGNOSIS — Z95828 Presence of other vascular implants and grafts: Secondary | ICD-10-CM | POA: Insufficient documentation

## 2019-08-07 DIAGNOSIS — Z5111 Encounter for antineoplastic chemotherapy: Secondary | ICD-10-CM | POA: Diagnosis not present

## 2019-08-07 LAB — CBC WITH DIFFERENTIAL/PLATELET
Abs Immature Granulocytes: 0.04 10*3/uL (ref 0.00–0.07)
Basophils Absolute: 0 10*3/uL (ref 0.0–0.1)
Basophils Relative: 1 %
Eosinophils Absolute: 0 10*3/uL (ref 0.0–0.5)
Eosinophils Relative: 1 %
HCT: 35.7 % — ABNORMAL LOW (ref 36.0–46.0)
Hemoglobin: 11.9 g/dL — ABNORMAL LOW (ref 12.0–15.0)
Immature Granulocytes: 1 %
Lymphocytes Relative: 30 %
Lymphs Abs: 0.9 10*3/uL (ref 0.7–4.0)
MCH: 30.6 pg (ref 26.0–34.0)
MCHC: 33.3 g/dL (ref 30.0–36.0)
MCV: 91.8 fL (ref 80.0–100.0)
Monocytes Absolute: 0.3 10*3/uL (ref 0.1–1.0)
Monocytes Relative: 9 %
Neutro Abs: 1.7 10*3/uL (ref 1.7–7.7)
Neutrophils Relative %: 58 %
Platelets: 196 10*3/uL (ref 150–400)
RBC: 3.89 MIL/uL (ref 3.87–5.11)
RDW: 14.2 % (ref 11.5–15.5)
WBC: 3 10*3/uL — ABNORMAL LOW (ref 4.0–10.5)
nRBC: 0 % (ref 0.0–0.2)

## 2019-08-07 LAB — COMPREHENSIVE METABOLIC PANEL
ALT: 38 U/L (ref 0–44)
AST: 24 U/L (ref 15–41)
Albumin: 4.6 g/dL (ref 3.5–5.0)
Alkaline Phosphatase: 42 U/L (ref 38–126)
Anion gap: 13 (ref 5–15)
BUN: 12 mg/dL (ref 6–20)
CO2: 21 mmol/L — ABNORMAL LOW (ref 22–32)
Calcium: 9.7 mg/dL (ref 8.9–10.3)
Chloride: 105 mmol/L (ref 98–111)
Creatinine, Ser: 0.8 mg/dL (ref 0.44–1.00)
GFR calc Af Amer: >60 mL/min (ref >60)
GFR calc non Af Amer: >60 mL/min (ref >60)
Glucose, Bld: 121 mg/dL — ABNORMAL HIGH (ref 70–99)
Potassium: 4.1 mmol/L (ref 3.5–5.1)
Sodium: 139 mmol/L (ref 135–145)
Total Bilirubin: 0.7 mg/dL (ref 0.3–1.2)
Total Protein: 7.5 g/dL (ref 6.5–8.1)

## 2019-08-07 MED ORDER — SODIUM CHLORIDE 0.9 % IV SOLN
150.0000 mg | Freq: Once | INTRAVENOUS | Status: AC
  Administered 2019-08-07: 150 mg via INTRAVENOUS
  Filled 2019-08-07: qty 150
  Filled 2019-08-07: qty 5

## 2019-08-07 MED ORDER — SODIUM CHLORIDE 0.9 % IV SOLN: 4.0000 mg | Freq: Once | INTRAVENOUS | Status: DC

## 2019-08-07 MED ORDER — SODIUM CHLORIDE 0.9 % IV SOLN
630.0000 mg | Freq: Once | INTRAVENOUS | Status: AC
  Administered 2019-08-07: 630 mg via INTRAVENOUS
  Filled 2019-08-07: qty 63

## 2019-08-07 MED ORDER — HEPARIN SOD (PORK) LOCK FLUSH 100 UNIT/ML IV SOLN
500.0000 [IU] | Freq: Once | INTRAVENOUS | Status: DC | PRN
  Filled 2019-08-07: qty 5

## 2019-08-07 MED ORDER — SODIUM CHLORIDE 0.9 % IV SOLN
Freq: Once | INTRAVENOUS | Status: AC
  Filled 2019-08-07: qty 250

## 2019-08-07 MED ORDER — PALONOSETRON HCL INJECTION 0.25 MG/5ML
0.2500 mg | Freq: Once | INTRAVENOUS | Status: AC
  Administered 2019-08-07: 0.25 mg via INTRAVENOUS

## 2019-08-07 MED ORDER — PACLITAXEL PROTEIN-BOUND CHEMO INJECTION 100 MG
100.0000 mg/m2 | Freq: Once | INTRAVENOUS | Status: AC
  Administered 2019-08-07: 175 mg via INTRAVENOUS
  Filled 2019-08-07: qty 35

## 2019-08-07 MED ORDER — SODIUM CHLORIDE 0.9 % IV SOLN
10.0000 mg | Freq: Once | INTRAVENOUS | Status: DC
  Filled 2019-08-07 (×2): qty 1

## 2019-08-07 MED ORDER — SODIUM CHLORIDE 0.9% FLUSH
10.0000 mL | INTRAVENOUS | Status: DC | PRN
  Filled 2019-08-07: qty 10

## 2019-08-07 MED ORDER — DEXAMETHASONE SODIUM PHOSPHATE 10 MG/ML IJ SOLN
INTRAMUSCULAR | Status: AC
  Filled 2019-08-07: qty 1

## 2019-08-07 MED ORDER — PALONOSETRON HCL INJECTION 0.25 MG/5ML
INTRAVENOUS | Status: AC
  Filled 2019-08-07: qty 5

## 2019-08-07 MED ORDER — SODIUM CHLORIDE 0.9% FLUSH
10.0000 mL | Freq: Once | INTRAVENOUS | Status: AC
  Administered 2019-08-07: 10 mL
  Filled 2019-08-07: qty 10

## 2019-08-07 MED ORDER — DEXAMETHASONE SODIUM PHOSPHATE 10 MG/ML IJ SOLN
4.0000 mg | Freq: Once | INTRAMUSCULAR | Status: AC
  Administered 2019-08-07: 4 mg via INTRAVENOUS

## 2019-08-07 NOTE — Telephone Encounter (Signed)
This RN contacted pt's speciality pharmacy for update on Zarxio for home administration-  Current status of her home adminstration for the Zarxio is in process- with [facility] The pharmacy has the prescription and is processing it- with expected start date of 6/17.  BUT it has not been completely processed - they  will call the patient when it is to verify shipping and   They cannot guarantee it will be approved or shipped for that date.  Pt has been informed to keep appointments for this week for shots until she has her supply in hand.   The phone number for her speciality pharmacy is [phone]

## 2019-08-08 NOTE — Progress Notes (Signed)
Pharmacist Chemotherapy Monitoring - Follow Up Assessment    I verify that I have reviewed each item in the below checklist:   Regimen for the patient is scheduled for the appropriate day and plan matches scheduled date.  Appropriate non-routine labs are ordered dependent on drug ordered.  If applicable, additional medications reviewed and ordered per protocol based on lifetime cumulative doses and/or treatment regimen.   Plan for follow-up and/or issues identified: Yes  I-vent associated with next due treatment: Yes  MD and/or nursing notified: No   Kennith Center, Pharm.D., CPP 08/08/2019@2 :54 PM

## 2019-08-09 ENCOUNTER — Other Ambulatory Visit: Payer: Self-pay

## 2019-08-09 ENCOUNTER — Other Ambulatory Visit: Payer: Self-pay | Admitting: *Deleted

## 2019-08-09 ENCOUNTER — Inpatient Hospital Stay: Payer: Commercial Managed Care - PPO

## 2019-08-09 VITALS — BP 128/72 | HR 78 | Temp 98.2°F | Resp 18

## 2019-08-09 DIAGNOSIS — Z95828 Presence of other vascular implants and grafts: Secondary | ICD-10-CM

## 2019-08-09 DIAGNOSIS — Z1501 Genetic susceptibility to malignant neoplasm of breast: Secondary | ICD-10-CM

## 2019-08-09 DIAGNOSIS — Z5111 Encounter for antineoplastic chemotherapy: Secondary | ICD-10-CM | POA: Diagnosis not present

## 2019-08-09 DIAGNOSIS — C50911 Malignant neoplasm of unspecified site of right female breast: Secondary | ICD-10-CM

## 2019-08-09 DIAGNOSIS — O30009 Twin pregnancy, unspecified number of placenta and unspecified number of amniotic sacs, unspecified trimester: Secondary | ICD-10-CM

## 2019-08-09 MED ORDER — DENOSUMAB 120 MG/1.7ML ~~LOC~~ SOLN
SUBCUTANEOUS | Status: AC
  Filled 2019-08-09: qty 1.7

## 2019-08-09 MED ORDER — FILGRASTIM-AAFI 480 MCG/0.8ML IJ SOSY: PREFILLED_SYRINGE | INTRAMUSCULAR | 0 refills | Status: DC

## 2019-08-09 MED ORDER — FILGRASTIM-SNDZ 480 MCG/0.8ML IJ SOSY
PREFILLED_SYRINGE | INTRAMUSCULAR | Status: AC
  Filled 2019-08-09: qty 0.8

## 2019-08-09 MED ORDER — FILGRASTIM-SNDZ 480 MCG/0.8ML IJ SOSY
480.0000 ug | PREFILLED_SYRINGE | Freq: Once | INTRAMUSCULAR | Status: AC
  Administered 2019-08-09: 480 ug via SUBCUTANEOUS

## 2019-08-09 NOTE — Patient Instructions (Signed)
Tbo-Filgrastim injection What is this medicine? TBO-FILGRASTIM (T B O fil GRA stim) is a granulocyte colony-stimulating factor that helps you make more neutrophils, a type of white blood cell. Neutrophils are important for fighting infections. Some chemotherapy affects your bone marrow and lowers your neutrophils. This medicine helps decrease the length of time that neutrophils are very low (severe neutropenia). This medicine may be used for other purposes; ask your health care provider or pharmacist if you have questions. COMMON BRAND NAME(S): Granix What should I tell my health care provider before I take this medicine? They need to know if you have any of these conditions:  bone scan or tests planned  kidney disease  sickle cell anemia  an unusual or allergic reaction to tbo-filgrastim, filgrastim, pegfilgrastim, other medicines, foods, dyes, or preservatives  pregnant or trying to get pregnant  breast-feeding How should I use this medicine? This medicine is for injection under the skin. If you get this medicine at home, you will be taught how to prepare and give this medicine. Refer to the Instructions for Use that come with your medication packaging. Use exactly as directed. Take your medicine at regular intervals. Do not take your medicine more often than directed. It is important that you put your used needles and syringes in a special sharps container. Do not put them in a trash can. If you do not have a sharps container, call your pharmacist or healthcare provider to get one. Talk to your pediatrician regarding the use of this medicine in children. While this drug may be prescribed for children as young as 1 month of age for selected conditions, precautions do apply. Overdosage: If you think you have taken too much of this medicine contact a poison control center or emergency room at once. NOTE: This medicine is only for you. Do not share this medicine with others. What if I miss a  dose? It is important not to miss your dose. Call your doctor or health care professional if you miss a dose. What may interact with this medicine? This medicine may interact with the following medications:  medicines that may cause a release of neutrophils, such as lithium This list may not describe all possible interactions. Give your health care provider a list of all the medicines, herbs, non-prescription drugs, or dietary supplements you use. Also tell them if you smoke, drink alcohol, or use illegal drugs. Some items may interact with your medicine. What should I watch for while using this medicine? You may need blood work done while you are taking this medicine. What side effects may I notice from receiving this medicine? Side effects that you should report to your doctor or health care professional as soon as possible:  allergic reactions like skin rash, itching or hives, swelling of the face, lips, or tongue  back pain  blood in the urine  dark urine  dizziness  fast heartbeat  feeling faint  shortness of breath or breathing problems  signs and symptoms of infection like fever or chills; cough; or sore throat  signs and symptoms of kidney injury like trouble passing urine or change in the amount of urine  stomach or side pain, or pain at the shoulder  sweating  swelling of the legs, ankles, or abdomen  tiredness Side effects that usually do not require medical attention (report to your doctor or health care professional if they continue or are bothersome):  bone pain  diarrhea  headache  muscle pain  vomiting This list may   not describe all possible side effects. Call your doctor for medical advice about side effects. You may report side effects to FDA at 1-800-FDA-1088. Where should I keep my medicine? Keep out of the reach of children. Store in a refrigerator between 2 and 8 degrees C (36 and 46 degrees F). Keep in carton to protect from light. Throw  away this medicine if it is left out of the refrigerator for more than 5 consecutive days. Throw away any unused medicine after the expiration date. NOTE: This sheet is a summary. It may not cover all possible information. If you have questions about this medicine, talk to your doctor, pharmacist, or health care provider.  2020 Elsevier/Gold Standard (2017-12-10 19:58:39)  

## 2019-08-10 ENCOUNTER — Ambulatory Visit: Payer: Commercial Managed Care - PPO

## 2019-08-10 ENCOUNTER — Other Ambulatory Visit: Payer: Self-pay | Admitting: *Deleted

## 2019-08-10 ENCOUNTER — Telehealth: Payer: Self-pay | Admitting: *Deleted

## 2019-08-10 ENCOUNTER — Inpatient Hospital Stay: Payer: Commercial Managed Care - PPO

## 2019-08-10 VITALS — BP 112/67 | HR 82 | Temp 98.1°F | Resp 18

## 2019-08-10 DIAGNOSIS — C50911 Malignant neoplasm of unspecified site of right female breast: Secondary | ICD-10-CM

## 2019-08-10 DIAGNOSIS — O30009 Twin pregnancy, unspecified number of placenta and unspecified number of amniotic sacs, unspecified trimester: Secondary | ICD-10-CM

## 2019-08-10 DIAGNOSIS — Z1501 Genetic susceptibility to malignant neoplasm of breast: Secondary | ICD-10-CM

## 2019-08-10 DIAGNOSIS — Z5111 Encounter for antineoplastic chemotherapy: Secondary | ICD-10-CM | POA: Diagnosis not present

## 2019-08-10 DIAGNOSIS — Z95828 Presence of other vascular implants and grafts: Secondary | ICD-10-CM

## 2019-08-10 MED ORDER — FILGRASTIM-SNDZ 480 MCG/0.8ML IJ SOSY
480.0000 ug | PREFILLED_SYRINGE | Freq: Once | INTRAMUSCULAR | Status: AC
  Administered 2019-08-10: 480 ug via SUBCUTANEOUS

## 2019-08-10 MED ORDER — FILGRASTIM-AAFI 480 MCG/0.8ML IJ SOSY: PREFILLED_SYRINGE | INTRAMUSCULAR | 0 refills | Status: DC

## 2019-08-10 MED ORDER — FILGRASTIM-SNDZ 480 MCG/0.8ML IJ SOSY
PREFILLED_SYRINGE | INTRAMUSCULAR | Status: AC
  Filled 2019-08-10: qty 0.8

## 2019-08-10 NOTE — Telephone Encounter (Signed)
This RN is following up on obtaining GSF for self administration at home for this patient.  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  This RN called to [facility] and per automated system was requested to call number on back of pt's insurance call.  This RN called the prior authorization number per insurance card and was transferred 4 times post discussion of what is needed - prior authorization for nivestym for home administration.  Per last transfer was informed this is a speciality drug and will need to go to the speciality department - call transferred with ongoing hold.  Due to being transferred and continuing on hold for 40 + minutes this RN sent Nivestym prescription with all ICD 10 codes above for processing per Epic - npte above done while on hold.  This RN continued on hold - with goal to speak to representative for obtaining authorization and verification of medication so prescription can be filled promptly for use this week for continuity of care for best outcome.  Due to need to provide other clinical care this RN discontinued hold.  Note above interaction took approximately 60 minutes.  This note will be sent to MD for review of communication.

## 2019-08-11 ENCOUNTER — Other Ambulatory Visit: Payer: Self-pay

## 2019-08-11 ENCOUNTER — Inpatient Hospital Stay: Payer: Commercial Managed Care - PPO

## 2019-08-11 ENCOUNTER — Telehealth: Payer: Self-pay | Admitting: *Deleted

## 2019-08-11 VITALS — BP 111/74 | HR 76 | Temp 98.0°F | Resp 20

## 2019-08-11 DIAGNOSIS — C50911 Malignant neoplasm of unspecified site of right female breast: Secondary | ICD-10-CM

## 2019-08-11 DIAGNOSIS — Z5111 Encounter for antineoplastic chemotherapy: Secondary | ICD-10-CM | POA: Diagnosis not present

## 2019-08-11 DIAGNOSIS — O30009 Twin pregnancy, unspecified number of placenta and unspecified number of amniotic sacs, unspecified trimester: Secondary | ICD-10-CM

## 2019-08-11 DIAGNOSIS — Z95828 Presence of other vascular implants and grafts: Secondary | ICD-10-CM

## 2019-08-11 DIAGNOSIS — Z1501 Genetic susceptibility to malignant neoplasm of breast: Secondary | ICD-10-CM

## 2019-08-11 MED ORDER — FILGRASTIM-SNDZ 480 MCG/0.8ML IJ SOSY
480.0000 ug | PREFILLED_SYRINGE | Freq: Once | INTRAMUSCULAR | Status: AC
  Administered 2019-08-11: 480 ug via SUBCUTANEOUS

## 2019-08-11 MED ORDER — FILGRASTIM-SNDZ 480 MCG/0.8ML IJ SOSY
PREFILLED_SYRINGE | INTRAMUSCULAR | Status: AC
  Filled 2019-08-11: qty 0.8

## 2019-08-13 ENCOUNTER — Encounter: Payer: Self-pay | Admitting: *Deleted

## 2019-08-13 ENCOUNTER — Ambulatory Visit: Payer: Self-pay | Admitting: Surgery

## 2019-08-13 ENCOUNTER — Telehealth: Payer: Self-pay | Admitting: *Deleted

## 2019-08-13 ENCOUNTER — Other Ambulatory Visit: Payer: Self-pay | Admitting: *Deleted

## 2019-08-13 DIAGNOSIS — C50912 Malignant neoplasm of unspecified site of left female breast: Secondary | ICD-10-CM

## 2019-08-13 MED ORDER — FILGRASTIM-AAFI 480 MCG/0.8ML IJ SOSY: PREFILLED_SYRINGE | INTRAMUSCULAR | 0 refills | Status: DC

## 2019-08-13 NOTE — H&P (Signed)
History of Present Illness Joy Reyes. Joy Ke MD; 08/13/2019 2:14 PM) The patient is a 34 year old female who presents with breast cancer. Oncology- [place] - placed by IR  This is a 34 year old female who presented in May 2021 with left breast cancer. She has a history of breast cancer diagnosed in 2012 and treated in [place]. Apparently she had neoadjuvant chemotherapy and had a lumpectomy through a right upper inner quadrant incision. Interestingly, she did not have any sentinel lymph node biopsy. She did not have any radiation. She was found to be BRCA1 positive. In April of this year, she noted some palpable changes. She underwent diagnostic mammogram and ultrasound on 4/22. She has breast density deep. The left upper outer quadrant of the breast showed a 2.8 cm mass as well as 1 single enlarged lymph node. She underwent biopsy. 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 The left axillary lymph node biopsy was nondiagnostic. She was referred to oncology. She underwent staging CT scans that were unremarkable. Port was placed by interventional radiology. She underwent bilateral breast MRI that revealed the known malignancy measuring 2.8 x 3.0 x 2.7 cm in the left upper outer quadrant. The medial left breast at 9:00 shows a 5 mm indeterminate mass. There is another 5.3 mm mass in the superior right breast. There are no other signs of lymphadenopathy. She underwent another right breast MRI with plans for a MR guided biopsy on 5/26. However at that time, the area in the upper right breast no longer enhanced so no biopsy was performed.  She is in the middle of her course of neoadjuvant chemotherapy. She has already been evaluated by plastic surgery. She is planning bilateral mastectomies with immediate reconstruction. She has not yet decided between conventional mastectomies versus nipple sparing mastectomies. She  is scheduled to see Dr. [name] again in July to plan her surgery.  CLINICAL DATA: Breast cancer staging.  EXAM: CT CHEST, ABDOMEN, AND PELVIS WITH CONTRAST  TECHNIQUE: Multidetector CT imaging of the chest, abdomen and pelvis was performed following the standard protocol during bolus administration of intravenous contrast.  CONTRAST: 166m OMNIPAQUE IOHEXOL 300 MG/ML SOLN  COMPARISON: None  FINDINGS: CT CHEST FINDINGS  Cardiovascular: Normal caliber thoracic aorta. Normal heart size. No pericardial effusion. No significant atherosclerotic changes. Central pulmonary vessels are unremarkable.  Mediastinum/Nodes: No thoracic inlet or axillary lymphadenopathy. The clips likely from prior biopsy in the LEFT axilla and in the LEFT and RIGHT breast.  Small lymph nodes along the margin of the LEFT pectoralis muscle (image 22, series 2) 7 mm. Similar small lymph nodes in the LEFT axilla. No subpectoral adenopathy. No supraclavicular adenopathy  Lungs/Pleura: Airways are patent. Lungs are clear.  Musculoskeletal: Heterogeneous enhancement of the LEFT breast near biopsy clips measuring 19 mm (image 26, series 2. No internal mammary adenopathy or definable chest wall mass aside from the enhancement in the LEFT breast. Smaller areas of enhancement adjacent to this focus. This area is adjacent to a presumed biopsy clip in the central portion of the upper LEFT breast.  CT ABDOMEN PELVIS FINDINGS  Hepatobiliary: Liver is normal. Gallbladder is normal. No biliary ductal dilation. No focal hepatic mass.  Pancreas: Pancreas is normal without focal lesion or ductal dilation. No peripancreatic inflammation.  Spleen: Spleen is normal size without focal lesion.  Adrenals/Urinary Tract: Adrenal glands are normal.  Renal enhancement is smooth. No hydronephrosis. Urinary bladder moderately distended, unremarkable by CT.  Stomach/Bowel: No acute  gastrointestinal process. The  appendix is normal. Stool fills much of the colon. Mixed with positive contrast. No pericolonic stranding.  Vascular/Lymphatic: Normal caliber abdominal aorta. No adenopathy in the retroperitoneum. No retrocrural adenopathy. No upper abdominal adenopathy. No visible mesenteric lymphadenopathy.  No pelvic lymphadenopathy.  Reproductive: Corpus luteum cyst in the RIGHT adnexa. CT appearance of pelvic viscera otherwise unremarkable.  Other: No abdominal wall hernia or abnormality. No abdominopelvic ascites.  Musculoskeletal: No acute bone finding. No destructive bone process.  IMPRESSION: 1. Heterogeneous enhancement with nodular features in the LEFT breast and small LEFT axillary lymph nodes, less than a cm as described. 2. No evidence of metastatic disease in the abdomen or pelvis.   Electronically Signed By: Zetta Bills M.D. On: 07/05/2019 15:38  CLINICAL DATA: Recent the diagnosed left breast cancer.  LABS: None avail  EXAM: BILATERAL BREAST MRI WITH AND WITHOUT CONTRAST  TECHNIQUE: Multiplanar, multisequence MR images of both breasts were obtained prior to and following the intravenous administration of 6 ml of Gadavist  Three-dimensional MR images were rendered by post-processing of the original MR data on an independent workstation. The three-dimensional MR images were interpreted, and findings are reported in the following complete MRI report for this study. Three dimensional images were evaluated at the independent DynaCad workstation  COMPARISON: MRI of the breast June 07, 2012. Mammography from April 2021.  FINDINGS: Breast composition: c. Heterogeneous fibroglandular tissue.  Background parenchymal enhancement: Marked  Right breast: Distortion in the central right breast correlates with a previous biopsy and is consistent with biopsy change. There is diffuse background enhancement on the right limiting evaluation for small masses. There are  multiple similar appearing foci and small masses throughout the right breast which demonstrate persistent kinetics and are considered benign. There is a mass in the superior right breast located between 12 o'clock and 1 o'clock measuring 5.3 mm on series 6, image 52 which demonstrates plateau kinetics. No other suspicious masses are seen in the right breast.  Left breast: The patient's known malignancy is identified in the upper outer quadrant of the left breast measuring 2.8 by 3.0 by 2.7 cm in transverse, AP, and craniocaudal dimensions. There is no involvement of the underlying pectoralis muscle identified. A small mass in the upper outer left breast which is T2 bright, seen on series 6, image 78 is consistent with an intramammary lymph node. There is an indeterminate mass at 9 o'clock in the left breast on series 6, image 86 measuring 5 mm which demonstrates plateau kinetics. No other suspicious findings seen in the left breast.  Lymph nodes: The previously biopsied left axillary node is again identified are mildly prominent. No other suspicious nodes are identified on today's study.  Ancillary findings: None.  IMPRESSION: 1. The patient's known malignancy measures 2.8 x 3.0 x 2.7 cm in the upper outer quadrant the left breast. 2. There is an indeterminate 5 mm mass in the medial left breast at 9 o'clock which demonstrates plateau kinetics and is indeterminate. 3. There is an indeterminate 5.3 mm mass in the superior right breast between 12 and 1 o'clock which demonstrates plateau kinetics. 4. There is marked background enhancement throughout the remainder of both breasts with numerous foci and small masses demonstrating persistent kinetics, consistent with a benign etiology. 5. The previously biopsied left axillary lymph node is mildly prominent and contains a biopsy clip. No other evidence of adenopathy.  RECOMMENDATION: The patient is scheduled to return for a re-biopsy  of a left axillary lymph node. The  patient is pursuing bilateral mastectomies. As a result, no additional biopsies are necessary of the left breast. Recommend MRI guided biopsy of the superiorly located right breast mass.  These findings and recommendations were called to [facility] in the cancer center.  BI-RADS CATEGORY 4: Suspicious.   Electronically Signed By: Dorise Bullion III M.D On: 07/09/2019 13:17  CLINICAL DATA: 34 year old premenopausal patient with recent diagnosis left breast cancer, planning bilateral mastectomies. Known BRCA 1 mutation. On recent bilateral breast MRI there was a mass described in the upper inner right breast recommended for MRI guided biopsy. The patient had marked background parenchymal enhancement pattern on the recent breast MRI. Today, she is having her menstrual cycle and recently completed her second cycle of chemotherapy.  LABS: None obtained  EXAM: MR OF THE RIGHT BREAST WITH AND WITHOUT CONTRAST  TECHNIQUE: Multiplanar, multisequence MR images of the right breast were obtained prior to and following the intravenous administration of 6 ml of Gadavist.  Three-dimensional MR images were rendered by post-processing of the original MR data on an independent workstation. The three-dimensional MR images were interpreted, and findings are reported in the following complete MRI report for this study. Three dimensional images were evaluated at the independent DynaCad workstation  COMPARISON: Recent MRI of the breast Jul 06, 2019  FINDINGS: Breast composition: c. Heterogeneous fibroglandular tissue.  Background parenchymal enhancement: Moderate.  Right breast: The background parenchymal enhancement pattern is significantly decreased on today's MRI, and much less nodular today compared to Jul 06, 2019. No focal mass is identified in the right breast to suggest malignancy and there is no target for biopsy.  Lymph nodes: No abnormal  appearing lymph nodes.  Ancillary findings: None.  IMPRESSION: Decreased background parenchymal enhancement of the right breast compared to recent prior on Jul 06, 2019 in this premenopausal patient. There is no enhancing mass to target for biopsy. No suspicious findings.  RECOMMENDATION: Continued treatment planning for known left breast cancer. Patient plans for bilateral mastectomies, and therefore no further imaging follow-up is suggested unless new areas of concern arise.  BI-RADS CATEGORY 1: Negative.   Electronically Signed By: Curlene Dolphin M.D. On: 07/18/2019 10:53   Problem List/Past Medical Rodman Key K. 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 Cesarean Section - 1   Diagnostic Studies History (Chanel Teressa Senter, [place]; 08/13/2019 10:49 AM) Colonoscopy  never Mammogram  within last year Pap Smear  1-5 years ago  Allergies ([place], CMA; 08/13/2019 10:49 AM) No Known Drug Allergies  [08/13/2019]: Allergies Reconciled   Medication History (Chanel Teressa Senter, CMA; 08/13/2019 10:51 AM) Prochlorperazine Maleate (10MG Tablet, Oral) Active. Nivestym (480MCG/1.6ML Solution, Injection) Active. Medications Reconciled  Social History Antonietta Jewel, CMA; 08/13/2019 10:49 AM) Alcohol use  Occasional alcohol use. Caffeine use  Coffee. No drug use  Tobacco use  Never smoker.  Family History (Chanel Teressa Senter, [place]; 08/13/2019 10:49 AM) Breast Cancer  Family Members In General. Heart Disease  Family Members In General.  Pregnancy / Birth History Antonietta Jewel, [place]; 08/13/2019 10:49 AM) Age at menarche  41 years. Gravida  1 Length (months) of breastfeeding   >24 Maternal age  50-30 Para  2 Regular periods   Other Problems Joy Reyes. Shahed Yeoman, MD; 08/13/2019 2:14 PM) Breast Cancer     Review of Systems (  Chanel Nolan CMA; 08/13/2019 10:49 AM) General Not Present- Appetite Loss, Chills, Fatigue, Fever, Night Sweats, Weight Gain and Weight Loss. Skin Not Present- Change in Wart/Mole, Dryness, Hives, Jaundice, New Lesions, Non-Healing Wounds, Rash and Ulcer. HEENT Not Present- Earache, Hearing Loss, Hoarseness, Nose Bleed, Oral Ulcers, Ringing in the Ears, Seasonal Allergies, Sinus Pain, Sore Throat, Visual Disturbances, Wears glasses/contact lenses and Yellow Eyes. Breast Present- Breast Mass. Not Present- Breast Pain, Nipple Discharge and Skin Changes. Cardiovascular Not Present- Chest Pain, Difficulty Breathing Lying Down, Leg Cramps, Palpitations, Rapid Heart Rate, Shortness of Breath and Swelling of Extremities. Gastrointestinal Not Present- Abdominal Pain, Bloating, Bloody Stool, Change in Bowel Habits, Chronic diarrhea, Constipation, Difficulty Swallowing, Excessive gas, Gets full quickly at meals, Hemorrhoids, Indigestion, Nausea, Rectal Pain and Vomiting. Female Genitourinary Not Present- Frequency, Nocturia, Painful Urination, Pelvic Pain and Urgency. Neurological Not Present- Decreased Memory, Fainting, Headaches, Numbness, Seizures, Tingling, Tremor, Trouble walking and Weakness. Psychiatric Not Present- Anxiety, Bipolar, Change in Sleep Pattern, Depression, Fearful and Frequent crying. Endocrine Not Present- Cold Intolerance, Excessive Hunger, Hair Changes, Heat Intolerance, Hot flashes and New Diabetes. Hematology Not Present- Blood Thinners, Easy Bruising, Excessive bleeding, Gland problems, HIV and Persistent Infections.  Vitals (Chanel Nolan CMA; 08/13/2019 10:51 AM) 08/13/2019 10:51 AM Weight: 139.13 lb Height: 68in Body Surface Area: 1.75 m Body Mass Index: 21.15 kg/m  Temp.: 61F  Pulse: 87 (Regular)  BP:  120/78(Sitting, Left Arm, Standard)       Physical Exam Rodman Key K. Laurielle Selmon MD; 08/13/2019 2:15 PM) The physical exam findings are as follows: Note: Constitutional: WDWN in NAD, conversant, no obvious deformities; resting comfortably Eyes: Pupils equal, round; sclera anicteric; moist conjunctiva; no lid lag HENT: Oral mucosa moist; good dentition Neck: No masses palpated, trachea midline; no thyromegaly Lungs: CTA bilaterally; normal respiratory effort Breasts: symmetric; healed RUIQ breast incision; no dominant masses; bilateral fibrocystic changes; no axillary or supraclavicular lymphadenopathy RUQ abdominal wall skin tag CV: Regular rate and rhythm; no murmurs; extremities well-perfused with no edema Abd: +bowel sounds, soft, non-tender, no palpable organomegaly; no palpable hernias Musc: Normal gait; no apparent clubbing or cyanosis in extremities Lymphatic: No palpable cervical or axillary lymphadenopathy Skin: Warm, dry; no sign of jaundice Psychiatric - alert and oriented x 4; calm mood and affect    Assessment & Plan Rodman Key K. 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 The surgical procedure has been discussed with the patient. Potential risks, benefits, alternative treatments, and expected outcomes have been explained. All of the patient's questions at this time have been answered. The likelihood of reaching the patient's treatment goal is good. The patient understand the proposed surgical procedure and wishes to proceed. Note:I spent approximately 40 minutes with the patient and her husband discussing surgical options. She has already made the decision for bilateral mastectomies. We  discussed the difference between conventional mastectomies and nipple sparing mastectomies. She is leaning towards conventional mastectomies with reconstruction with tissue expanders. She will also need a left axillary sentinel lymph node biopsy. I explained the entire procedure to the patient. We will coordinate scheduling with plastic surgery. We will try to schedule this for about 4 weeks after her last chemotherapy which should be administered in August.  Zackari Ruane K. [name], MD, [facility] Surgery  General/ Trauma Surgery   08/13/2019 2:17 PM

## 2019-08-13 NOTE — Telephone Encounter (Signed)
Spoke to pt concerning prescription and authorization for Zarxioo/ Nivestym.Pt informed that if a letter of medical necessity was sent and marked urgent, it had to be reviewed within 24hrs. Was also contacted by pt advocate of insurance Sharyn Lull [phone]) stating verbal order can be given over the phone for Nivestym.  Spoke to Dr. Jana Hakim nurse regarding the above and provided contact information for [place].

## 2019-08-13 NOTE — Telephone Encounter (Addendum)
This RN spoke with nurse Navigator and to the pt regarding status of process for home administration of Nivestym ( biosimilar to neupogen) - see prior note.  Number given for contact with her case manager Sharyn Lull at [phone].  This RN called to the above and was given a VM due to " health assistant not available at this time ". This RN name and direct desk number given per urgent request for assistance with obtaining above medication.  This RN then called the number given " that you can get a verbal authorization " [phone] and post prompt spoke with Kiko.  Per processs- was informed need " clinical data faxed for clinician review ".  Fax number of [phone].  Most recent dictation, treatment regimen plan and copy of prescription with diagnosis codes faxed to above and marked as Urgent.  Note per discussion with Kiko request has been marked Urgent.

## 2019-08-14 ENCOUNTER — Other Ambulatory Visit: Payer: Commercial Managed Care - PPO

## 2019-08-14 ENCOUNTER — Telehealth: Payer: Self-pay | Admitting: *Deleted

## 2019-08-14 ENCOUNTER — Other Ambulatory Visit: Payer: Self-pay

## 2019-08-14 ENCOUNTER — Ambulatory Visit: Payer: Commercial Managed Care - PPO

## 2019-08-14 ENCOUNTER — Inpatient Hospital Stay: Payer: Commercial Managed Care - PPO

## 2019-08-14 VITALS — BP 110/77 | HR 69 | Temp 98.2°F | Resp 19

## 2019-08-14 DIAGNOSIS — C50911 Malignant neoplasm of unspecified site of right female breast: Secondary | ICD-10-CM

## 2019-08-14 DIAGNOSIS — C50412 Malignant neoplasm of upper-outer quadrant of left female breast: Secondary | ICD-10-CM

## 2019-08-14 DIAGNOSIS — Z5111 Encounter for antineoplastic chemotherapy: Secondary | ICD-10-CM | POA: Diagnosis not present

## 2019-08-14 DIAGNOSIS — Z95828 Presence of other vascular implants and grafts: Secondary | ICD-10-CM

## 2019-08-14 DIAGNOSIS — C50211 Malignant neoplasm of upper-inner quadrant of right female breast: Secondary | ICD-10-CM

## 2019-08-14 DIAGNOSIS — Z171 Estrogen receptor negative status [ER-]: Secondary | ICD-10-CM

## 2019-08-14 LAB — COMPREHENSIVE METABOLIC PANEL
ALT: 28 U/L (ref 0–44)
AST: 20 U/L (ref 15–41)
Albumin: 4.4 g/dL (ref 3.5–5.0)
Alkaline Phosphatase: 55 U/L (ref 38–126)
Anion gap: 10 (ref 5–15)
BUN: 14 mg/dL (ref 6–20)
CO2: 22 mmol/L (ref 22–32)
Calcium: 9.7 mg/dL (ref 8.9–10.3)
Chloride: 107 mmol/L (ref 98–111)
Creatinine, Ser: 0.75 mg/dL (ref 0.44–1.00)
GFR calc Af Amer: >60 mL/min (ref >60)
GFR calc non Af Amer: >60 mL/min (ref >60)
Glucose, Bld: 107 mg/dL — ABNORMAL HIGH (ref 70–99)
Potassium: 4.2 mmol/L (ref 3.5–5.1)
Sodium: 139 mmol/L (ref 135–145)
Total Bilirubin: 0.7 mg/dL (ref 0.3–1.2)
Total Protein: 7.2 g/dL (ref 6.5–8.1)

## 2019-08-14 LAB — CBC WITH DIFFERENTIAL/PLATELET
Abs Immature Granulocytes: 0 10*3/uL (ref 0.00–0.07)
Basophils Absolute: 0 10*3/uL (ref 0.0–0.1)
Basophils Relative: 1 %
Eosinophils Absolute: 0 10*3/uL (ref 0.0–0.5)
Eosinophils Relative: 1 %
HCT: 31 % — ABNORMAL LOW (ref 36.0–46.0)
Hemoglobin: 10.5 g/dL — ABNORMAL LOW (ref 12.0–15.0)
Immature Granulocytes: 0 %
Lymphocytes Relative: 41 %
Lymphs Abs: 0.8 10*3/uL (ref 0.7–4.0)
MCH: 31.6 pg (ref 26.0–34.0)
MCHC: 33.9 g/dL (ref 30.0–36.0)
MCV: 93.4 fL (ref 80.0–100.0)
Monocytes Absolute: 0.2 10*3/uL (ref 0.1–1.0)
Monocytes Relative: 10 %
Neutro Abs: 0.9 10*3/uL — ABNORMAL LOW (ref 1.7–7.7)
Neutrophils Relative %: 47 %
Platelets: 248 10*3/uL (ref 150–400)
RBC: 3.32 MIL/uL — ABNORMAL LOW (ref 3.87–5.11)
RDW: 14.3 % (ref 11.5–15.5)
WBC: 2 10*3/uL — ABNORMAL LOW (ref 4.0–10.5)
nRBC: 0 % (ref 0.0–0.2)

## 2019-08-14 MED ORDER — HEPARIN SOD (PORK) LOCK FLUSH 100 UNIT/ML IV SOLN
500.0000 [IU] | Freq: Once | INTRAVENOUS | Status: AC | PRN
  Administered 2019-08-14: 500 [IU]
  Filled 2019-08-14: qty 5

## 2019-08-14 MED ORDER — SODIUM CHLORIDE 0.9% FLUSH
10.0000 mL | INTRAVENOUS | Status: DC | PRN
  Administered 2019-08-14: 10 mL
  Filled 2019-08-14: qty 10

## 2019-08-14 MED ORDER — PROCHLORPERAZINE MALEATE 10 MG PO TABS
ORAL_TABLET | ORAL | Status: AC
  Filled 2019-08-14: qty 1

## 2019-08-14 MED ORDER — DEXAMETHASONE SODIUM PHOSPHATE 10 MG/ML IJ SOLN: 4.0000 mg | Freq: Once | INTRAMUSCULAR | Status: DC

## 2019-08-14 MED ORDER — SODIUM CHLORIDE 0.9% FLUSH
10.0000 mL | Freq: Once | INTRAVENOUS | Status: AC
  Administered 2019-08-14: 10 mL
  Filled 2019-08-14: qty 10

## 2019-08-14 MED ORDER — PROCHLORPERAZINE MALEATE 10 MG PO TABS
10.0000 mg | ORAL_TABLET | Freq: Once | ORAL | Status: AC
  Administered 2019-08-14: 10 mg via ORAL

## 2019-08-14 MED ORDER — SODIUM CHLORIDE 0.9 % IV SOLN: 4.0000 mg | Freq: Once | INTRAVENOUS | Status: DC

## 2019-08-14 MED ORDER — PACLITAXEL PROTEIN-BOUND CHEMO INJECTION 100 MG
100.0000 mg/m2 | Freq: Once | INTRAVENOUS | Status: AC
  Administered 2019-08-14: 175 mg via INTRAVENOUS
  Filled 2019-08-14: qty 35

## 2019-08-14 MED ORDER — DEXAMETHASONE SODIUM PHOSPHATE 10 MG/ML IJ SOLN
INTRAMUSCULAR | Status: AC
  Filled 2019-08-14: qty 1

## 2019-08-14 MED ORDER — SODIUM CHLORIDE 0.9 % IV SOLN
Freq: Once | INTRAVENOUS | Status: AC
  Filled 2019-08-14: qty 250

## 2019-08-14 NOTE — Patient Instructions (Signed)

## 2019-08-14 NOTE — Progress Notes (Signed)
Verbal order per Dr. Jana Hakim: okay to treat pt. with ANC of 0.9, she will be scheduled for [facility] shots on 08/16/19,08/17/19 & 08/18/19.  Scheduling message sent.

## 2019-08-14 NOTE — Patient Instructions (Signed)
[facility] Discharge Instructions for Patients Receiving Chemotherapy  Today you received the following chemotherapy agents Paclitaxel-protein (ABRAXANE).  To help prevent nausea and vomiting after your treatment, we encourage you to take your nausea medication as prescribed  If you develop nausea and vomiting that is not controlled by your nausea medication, call the clinic.   BELOW ARE SYMPTOMS THAT SHOULD BE REPORTED IMMEDIATELY:  *FEVER GREATER THAN 100.5 F  *CHILLS WITH OR WITHOUT FEVER  NAUSEA AND VOMITING THAT IS NOT CONTROLLED WITH YOUR NAUSEA MEDICATION  *UNUSUAL SHORTNESS OF BREATH  *UNUSUAL BRUISING OR BLEEDING  TENDERNESS IN MOUTH AND THROAT WITH OR WITHOUT PRESENCE OF ULCERS  *URINARY PROBLEMS  *BOWEL PROBLEMS  UNUSUAL RASH Items with * indicate a potential emergency and should be followed up as soon as possible.  Feel free to call the clinic should you have any questions or concerns. The clinic phone number is [phone].  Please show the CHEMO ALERT CARD at check-in to the Emergency Department and triage nurse.   

## 2019-08-14 NOTE — Telephone Encounter (Signed)
This RN left a message with the pt's insurance casemanager at [employer]- to follow up on status of Nivestym for home use.  This RN direct desk number given for communication.  Last note from yesterday under " refill " - to view click " see all " for update.  Per call yesterday pm- all data was verified as received and marked Urgent for prior authorization.  Of note pt was informed " your doctor can call and give a verbal to get the prior authorization "  This actually entailed a lot more then just calling- calling started the process - which is more complicated including faxing data for clinical review and awaiting outcome.

## 2019-08-16 ENCOUNTER — Telehealth: Payer: Self-pay | Admitting: Pharmacist

## 2019-08-16 ENCOUNTER — Ambulatory Visit: Payer: Commercial Managed Care - PPO

## 2019-08-16 NOTE — Telephone Encounter (Signed)
Pharmacist Encounter  Called Ms. [place] and spoke with her today - she is expecting a shipment of her Zarxio injections from [facility] later today. 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 She understands to store the medication in the refrigerator upon receipt. She knows to call our clinic back if she has any questions or concerns while she is getting started.   Demetrius Charity, PharmD, BCPS, [facility] Pharmacist Pharmacy Phone: [phone] 08/16/2019

## 2019-08-17 ENCOUNTER — Inpatient Hospital Stay: Payer: Commercial Managed Care - PPO

## 2019-08-18 ENCOUNTER — Ambulatory Visit: Payer: Commercial Managed Care - PPO

## 2019-08-20 NOTE — Progress Notes (Signed)
Joy Reyes  [phone] Fax:([phone]) [phone]     ID: Joy Reyes DOB: Aug 23, 1985  MR#: [id number]  RNH#:[id number]  Patient Care Team: System, Pcp Not In as PCP - General [name], [name], MD as Consulting Physician (Oncology) Fulton Reek, MD as Referring Physician (Obstetrics and Gynecology) [name], RN as Oncology Nurse Navigator [name], RN as Oncology Nurse Navigator [name], MD as Consulting Physician (Plastic Surgery) Scot Dock, NP OTHER MD: PCP= [name] MD  CHIEF COMPLAINT: BRCA positive breast cancer  CURRENT TREATMENT: Neoadjuvant chemotherapy   INTERVAL HISTORY: Joy Reyes" returns today for follow up and treatment of her BRCA positive breast cancer, accompanied by her husband Joy Reyes.  She started neoadjuvant chemotherapy consisting of carboplatin day 1, Abraxane days 1,8,15, repeated Q21 days x 4 cycles, on 07/10/2019. Today is day 15 cycle 2.  On the third fourth and fifth day following chemo, she is self injecting Nivestym 485mg at home.  She did this after her most recent treatment as her Joy Reyes.    REVIEW OF SYSTEMS: Joy Nievesis doing well today.  She is at home with her two twin boys who are 33years old.  She says that she remains active and denies any peripheral neuropathy.  She has had no fever, chills, chest pain, cough, bowel/bladder changes, headaches, vision issues, or any other concerns.  A detailed ROS was otherwise non contributory.    HISTORY OF CURRENT ILLNESS: From the original intake note:  Joy Revardhas a history of breast cancer, [place]  We are obtaining data from that experience but according to the patient her tumor was the size of an apricot.  She did not have axillary lymph node sampling or radiation.  She remembers 8 neoadjuvant chemotherapy treatments given every 2 weeks with  the first including the "red drug", which suggest Cytoxan and Adriamycin in dose dense fashion x4 most likely followed by either Taxol or Taxotere in dose dense fashion x4.  After that experience and given her breast density and BRCA mutation our practice here would have been a yearly follow-up with MRI in addition to yearly mammography.   More recently Joy Nievesherself palpated a change in her right breast.  She brought it to Dr. STrecia Rogersattention and [facility]: breast density category D; there was an area of asymmetry in the upper outer quadrant of the left breast, and ultrasound of that area showed a 2.8 cm mass.  Ultrasound of the axilla showed 1.8 cm lymph node with cortical thickening.    In the right breast they saw only postsurgical changes, with 2 biopsy clips in the retroareolar area.  Accordingly on 06/19/2019 she proceeded to biopsy of the left breast area in question. 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  The left axillary lymph node biopsy was nondiagnostic, with very spare sampling of fibroadipose and lymphoid tissue.  Estrogen receptor was negative, with less than 1% uptake, progesterone receptor was negative, with less than 1% uptake, HER-2 was negative by immunohistochemistry with a score of 1+.  The patient's subsequent history is as detailed below.   PAST MEDICAL HISTORY: Past Medical History:  Diagnosis Date  . Cancer ([facility]    breast cancer 2012  PAST SURGICAL HISTORY: Past Surgical History:  Procedure Laterality Date  . BREAST SURGERY     lumpectomy on right breast  . CESAREAN SECTION MULTI-GESTATIONAL N/A 06/01/2015   Procedure: CESAREAN SECTION MULTI-GESTATIONAL;  Surgeon: Waymon Amato, MD;  Location: [facility];  Service: Obstetrics;  Laterality: N/A;  . IR IMAGING  GUIDED PORT INSERTION  07/05/2019  . LOBECTOMY Right 2012  . SALPINGECTOMY      FAMILY HISTORY: Family History  Problem Relation Age of Onset  . Breast cancer Maternal Grandmother        very late in life, lived to 60  . Breast cancer Paternal Grandmother        died at age 44  . Alzheimer's disease Paternal Grandfather     The patient's parents are both turning 51 in 2021.  The paternal grandfather died from Alzheimer's disease.  The paternal grandmother died from breast cancer at the age of 96.  The patient's father had 1 sister who had died as a child from meningitis.  He had no brothers.  On the mother side, a maternal grandmother did have breast cancer very late in life, living to be 67.  The paternal grandfather and the only maternal aunt had no history of cancer.  The patient has 1 brother, in good health, no sisters.   GYNECOLOGIC HISTORY:  No LMP recorded. Menarche: 35 years old Age at first live birth: 34 years old [place] twins LMP regular as of May 2021 Contraceptive HRT no  Hysterectomy?  No BSO?  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  Her husband Joy Reyes") works for an NGO (the [employer]) out of [place] which helps small agencies doing research set up and succeed.  Their twins are 33 years old. Joy Reyes has (2?) children from a prior marriage. The patient belongs to an orthodox denomination    ADVANCED DIRECTIVES: In the absence of any documentation to the contrary, the patient's spouse is their HCPOA.    HEALTH MAINTENANCE: Social History   Tobacco Use  . Smoking status: Never Smoker  . Smokeless tobacco: Never Used  Substance Use Topics  . Alcohol use: No  . Drug use: No     Colonoscopy: n/a (age)  PAP: Up-to-date  Bone density: n/a (age)   Allergies  Allergen Reactions  . Paclitaxel     Anaphylactic per pt history in MD note    Current  Outpatient Medications  Medication Sig Dispense Refill  . dexamethasone (DECADRON) 4 MG tablet Take 2 tablets (8 mg total) by mouth daily. Start the day after carboplatin chemotherapy (day 1 of each 21 days cycle) and continue for 2 days (days 2 and 3, counting chemotherapy day as day 1);  last dose the morning of day 4. 30 tablet 1  . filgrastim-aafi (NIVESTYM) 480 MCG/0.8ML SOSY injection Pt to self administered 480 mcg SQ on days 3,4 and 5 of weekly chemotherapy x 9 weeks 21.6 mL 0  . lidocaine-prilocaine (EMLA) cream Apply to affected area once 30 g 3  . LORazepam (ATIVAN) 0.5 MG tablet Take 1 tablet (0.5 mg total) by mouth at bedtime as needed (Nausea or vomiting). 20 tablet 0  . prochlorperazine (COMPAZINE) 10 MG tablet Take 1 tablet (10 mg total) by mouth every 6 (six) hours as needed (Nausea or vomiting). 30 tablet 1   No current facility-administered medications for this visit.    OBJECTIVE: White woman  who appears stated age  88:   08/21/19 0813  BP: 131/87  Pulse: 75  Resp: 17  Temp: 98.3 F (36.8 C)  SpO2: 100%     Body mass index is 22.32 kg/m.   Wt Readings from Last 3 Encounters:  08/21/19 146 lb 12.8 oz (66.6 kg)  08/07/19 138 lb (62.6 kg)  07/31/19 140 lb 14.4 oz (63.9 kg)      ECOG FS:1 - Symptomatic but completely ambulatory  GENERAL: Patient is a well appearing female in no acute distress HEENT:  Sclerae anicteric.  Mask in place.  Neck is supple.  NODES:  No cervical, supraclavicular, or axillary lymphadenopathy palpated.  BREAST EXAM:  Deferred. LUNGS:  Clear to auscultation bilaterally.  No wheezes or rhonchi. HEART:  Regular rate and rhythm. No murmur appreciated. ABDOMEN:  Soft, nontender.  Positive, normoactive bowel sounds. No organomegaly palpated. MSK:  No focal spinal tenderness to palpation. Full range of motion bilaterally in the upper extremities. EXTREMITIES:  No peripheral edema.   SKIN:  Clear with no obvious rashes or skin changes. No  nail dyscrasia. NEURO:  Nonfocal. Well oriented.  Appropriate affect.     LAB RESULTS:      Component Value Date/Time   NA 139 08/14/2019 0815   K 4.2 08/14/2019 0815   CL 107 08/14/2019 0815   CO2 22 08/14/2019 0815   GLUCOSE 107 (H) 08/14/2019 0815   BUN 14 08/14/2019 0815   CREATININE 0.75 08/14/2019 0815   CALCIUM 9.7 08/14/2019 0815   PROT 7.2 08/14/2019 0815   ALBUMIN 4.4 08/14/2019 0815   AST 20 08/14/2019 0815   ALT 28 08/14/2019 0815   ALKPHOS 55 08/14/2019 0815   BILITOT 0.7 08/14/2019 0815   GFRNONAA >60 08/14/2019 0815   GFRAA >60 08/14/2019 0815    No results found for: TOTALPROTELP, ALBUMINELP, A1GS, A2GS, BETS, BETA2SER, GAMS, MSPIKE, SPEI  Lab Results  Component Value Date   WBC 3.8 (L) 08/21/2019   NEUTROABS PENDING 08/21/2019   HGB 9.3 (L) 08/21/2019   HCT 27.9 (L) 08/21/2019   MCV 95.2 08/21/2019   PLT 120 (L) 08/21/2019    No results found for: LABCA2  No components found for: GYBWLS937  No results for input(s): INR in the last 168 hours.  No results found for: LABCA2  No results found for: DSK876  No results found for: OTL572  No results found for: IOM355  No results found for: CA2729  No components found for: HGQUANT  No results found for: CEA1 / No results found for: CEA1   No results found for: AFPTUMOR  No results found for: CHROMOGRNA  No results found for: KPAFRELGTCHN, LAMBDASER, KAPLAMBRATIO (kappa/lambda light chains)  No results found for: HGBA, HGBA2QUANT, HGBFQUANT, HGBSQUAN (Hemoglobinopathy evaluation)   No results found for: LDH  No results found for: IRON, TIBC, IRONPCTSAT (Iron and TIBC)  No results found for: FERRITIN  Urinalysis No results found for: COLORURINE, APPEARANCEUR, LABSPEC, PHURINE, GLUCOSEU, HGBUR, BILIRUBINUR, KETONESUR, PROTEINUR, UROBILINOGEN, NITRITE, LEUKOCYTESUR   STUDIES: No results found.  ELIGIBLE FOR AVAILABLE RESEARCH PROTOCOL: [phone]?  ASSESSMENT: 34 y.o. BRCA positive  [facility] woman  (0) BRCA positivity:  (a) s/p bilateral salpingectomy  (b) pelvic ultrasound including transvaginal study 06/01/2019 unremarkable, as was CA 125  (c) planning on bilateral mastectomies  (d) bilateral salpingectomy 11/28/2013, age 89 (no oophorectomy)  RIGHT BREAST CANCER (1) Status post right lumpectomy 2012 for a [        ]  (a) received neoadjuvant  chemotherapy consisting of 4 cycles of doxorubicin and cyclophosphamide, followed by paclitaxel x1 (developed anaphylaxis with the first dose), then 4 doses of docetaxel completed August 2012  (b) [no adjuvant radiation]  LEFT BREAST CANCER (2) Status post left breast upper outer quadrant biopsy 06/19/2019 for a clinical T2 N1, stage IIIB invasive ductal carcinoma, grade 3, triple negative  (a) left axillary lymph node biopsy 07/12/2019 nondiagnostic [discordant]  (b) chest abdomen and pelvis CT scan 07/05/2019 shows small left axillary lymph nodes, 1.5 cm enhancement in the left breast, no internal mammary adenopathy, no evidence of metastatic disease  (c) bone scan 07/13/2019 shows no evidence of bony metastatic disease  (d) biopsy of RIGHT breast mass 07/18/2019  (3) neoadjuvant chemotherapy consisting of carboplatin day 1, Abraxane days 1,8,15, repeated Q21 days x 4 cycles, starting 07/10/2019  (4) definitive surgery to follow  (5) adjuvant radiation as appropriate  (6) consider adjuvant pembrolizumab or PARP inhibitor   PLAN: Joy Reyes continues on neoadjuvant chemotherapy with Abraxane and Carboplatin and is tolerating it well.  She injected with Nuvestym (neupogen biosimilar for at home) and it went well.  She has no peripheral neuropathy, and we are monitoring her closely for this.   Joy Reyes has continued to have an excellent activity level, and is eating well.  She was encouraged to continue this.     I reviewed her labs and printed them out for her.  They are improved from last week.  She will proceed with  treatment today.  She will return weekly for chemotherapy, and we will see her with every other treatment until she reaches number 8 at which point we will see her weekly.    She knows to call for any questions that may arise between now and her next appointment.  We are happy to see her sooner if needed.   Total encounter time: 20 minutes   Wilber Bihari, NP 08/21/19 8:38 AM Medical Oncology and Hematology [facility] [place], [place] [address] Tel. [phone]    Fax. [phone]   *Total Encounter Time as defined by the Centers for Medicare and Medicaid Services includes, in addition to the face-to-face time of a patient visit (documented in the note above) non-face-to-face time: obtaining and reviewing outside history, ordering and reviewing medications, tests or procedures, care coordination (communications with other health care professionals or caregivers) and documentation in the medical record.

## 2019-08-21 ENCOUNTER — Encounter: Payer: Self-pay | Admitting: Adult Health

## 2019-08-21 ENCOUNTER — Other Ambulatory Visit: Payer: Self-pay

## 2019-08-21 ENCOUNTER — Inpatient Hospital Stay: Payer: Commercial Managed Care - PPO

## 2019-08-21 ENCOUNTER — Other Ambulatory Visit: Payer: Commercial Managed Care - PPO

## 2019-08-21 ENCOUNTER — Inpatient Hospital Stay (HOSPITAL_BASED_OUTPATIENT_CLINIC_OR_DEPARTMENT_OTHER): Payer: Commercial Managed Care - PPO | Admitting: Adult Health

## 2019-08-21 ENCOUNTER — Ambulatory Visit: Payer: Commercial Managed Care - PPO

## 2019-08-21 VITALS — BP 131/87 | HR 75 | Temp 98.3°F | Resp 17 | Ht 68.0 in | Wt 146.8 lb

## 2019-08-21 DIAGNOSIS — C50211 Malignant neoplasm of upper-inner quadrant of right female breast: Secondary | ICD-10-CM

## 2019-08-21 DIAGNOSIS — C50911 Malignant neoplasm of unspecified site of right female breast: Secondary | ICD-10-CM

## 2019-08-21 DIAGNOSIS — C50412 Malignant neoplasm of upper-outer quadrant of left female breast: Secondary | ICD-10-CM

## 2019-08-21 DIAGNOSIS — Z5111 Encounter for antineoplastic chemotherapy: Secondary | ICD-10-CM | POA: Diagnosis not present

## 2019-08-21 DIAGNOSIS — Z1501 Genetic susceptibility to malignant neoplasm of breast: Secondary | ICD-10-CM

## 2019-08-21 DIAGNOSIS — Z95828 Presence of other vascular implants and grafts: Secondary | ICD-10-CM

## 2019-08-21 DIAGNOSIS — Z171 Estrogen receptor negative status [ER-]: Secondary | ICD-10-CM

## 2019-08-21 LAB — CBC WITH DIFFERENTIAL/PLATELET
Abs Immature Granulocytes: 0.2 10*3/uL — ABNORMAL HIGH (ref 0.00–0.07)
Basophils Absolute: 0 10*3/uL (ref 0.0–0.1)
Basophils Relative: 1 %
Eosinophils Absolute: 0 10*3/uL (ref 0.0–0.5)
Eosinophils Relative: 0 %
HCT: 27.9 % — ABNORMAL LOW (ref 36.0–46.0)
Hemoglobin: 9.3 g/dL — ABNORMAL LOW (ref 12.0–15.0)
Immature Granulocytes: 5 %
Lymphocytes Relative: 39 %
Lymphs Abs: 1.5 10*3/uL (ref 0.7–4.0)
MCH: 31.7 pg (ref 26.0–34.0)
MCHC: 33.3 g/dL (ref 30.0–36.0)
MCV: 95.2 fL (ref 80.0–100.0)
Monocytes Absolute: 0.5 10*3/uL (ref 0.1–1.0)
Monocytes Relative: 12 %
Neutro Abs: 1.6 10*3/uL — ABNORMAL LOW (ref 1.7–7.7)
Neutrophils Relative %: 43 %
Platelets: 120 10*3/uL — ABNORMAL LOW (ref 150–400)
RBC: 2.93 MIL/uL — ABNORMAL LOW (ref 3.87–5.11)
RDW: 15.5 % (ref 11.5–15.5)
WBC: 3.8 10*3/uL — ABNORMAL LOW (ref 4.0–10.5)
nRBC: 0 % (ref 0.0–0.2)

## 2019-08-21 LAB — COMPREHENSIVE METABOLIC PANEL
ALT: 43 U/L (ref 0–44)
AST: 29 U/L (ref 15–41)
Albumin: 4 g/dL (ref 3.5–5.0)
Alkaline Phosphatase: 50 U/L (ref 38–126)
Anion gap: 9 (ref 5–15)
BUN: 12 mg/dL (ref 6–20)
CO2: 21 mmol/L — ABNORMAL LOW (ref 22–32)
Calcium: 9 mg/dL (ref 8.9–10.3)
Chloride: 110 mmol/L (ref 98–111)
Creatinine, Ser: 0.73 mg/dL (ref 0.44–1.00)
GFR calc Af Amer: >60 mL/min (ref >60)
GFR calc non Af Amer: >60 mL/min (ref >60)
Glucose, Bld: 117 mg/dL — ABNORMAL HIGH (ref 70–99)
Potassium: 4 mmol/L (ref 3.5–5.1)
Sodium: 140 mmol/L (ref 135–145)
Total Bilirubin: <0.2 mg/dL — ABNORMAL LOW (ref 0.3–1.2)
Total Protein: 6.4 g/dL — ABNORMAL LOW (ref 6.5–8.1)

## 2019-08-21 MED ORDER — SODIUM CHLORIDE 0.9% FLUSH
10.0000 mL | INTRAVENOUS | Status: DC | PRN
  Administered 2019-08-21: 10 mL
  Filled 2019-08-21: qty 10

## 2019-08-21 MED ORDER — PROCHLORPERAZINE MALEATE 10 MG PO TABS
10.0000 mg | ORAL_TABLET | Freq: Once | ORAL | Status: AC
  Administered 2019-08-21: 10 mg via ORAL

## 2019-08-21 MED ORDER — PROCHLORPERAZINE MALEATE 10 MG PO TABS
ORAL_TABLET | ORAL | Status: AC
  Filled 2019-08-21: qty 1

## 2019-08-21 MED ORDER — SODIUM CHLORIDE 0.9 % IV SOLN
Freq: Once | INTRAVENOUS | Status: AC
  Filled 2019-08-21: qty 250

## 2019-08-21 MED ORDER — HEPARIN SOD (PORK) LOCK FLUSH 100 UNIT/ML IV SOLN
500.0000 [IU] | Freq: Once | INTRAVENOUS | Status: AC | PRN
  Administered 2019-08-21: 500 [IU]
  Filled 2019-08-21: qty 5

## 2019-08-21 MED ORDER — SODIUM CHLORIDE 0.9% FLUSH
10.0000 mL | Freq: Once | INTRAVENOUS | Status: AC
  Administered 2019-08-21: 10 mL
  Filled 2019-08-21: qty 10

## 2019-08-21 MED ORDER — PACLITAXEL PROTEIN-BOUND CHEMO INJECTION 100 MG
100.0000 mg/m2 | Freq: Once | INTRAVENOUS | Status: AC
  Administered 2019-08-21: 175 mg via INTRAVENOUS
  Filled 2019-08-21: qty 35

## 2019-08-21 NOTE — Patient Instructions (Signed)
[facility] Discharge Instructions for Patients Receiving Chemotherapy  Today you received the following chemotherapy agents: protein-bound paclitaxel.  To help prevent nausea and vomiting after your treatment, we encourage you to take your nausea medication as directed.   If you develop nausea and vomiting that is not controlled by your nausea medication, call the clinic.   BELOW ARE SYMPTOMS THAT SHOULD BE REPORTED IMMEDIATELY:  *FEVER GREATER THAN 100.5 F  *CHILLS WITH OR WITHOUT FEVER  NAUSEA AND VOMITING THAT IS NOT CONTROLLED WITH YOUR NAUSEA MEDICATION  *UNUSUAL SHORTNESS OF BREATH  *UNUSUAL BRUISING OR BLEEDING  TENDERNESS IN MOUTH AND THROAT WITH OR WITHOUT PRESENCE OF ULCERS  *URINARY PROBLEMS  *BOWEL PROBLEMS  UNUSUAL RASH Items with * indicate a potential emergency and should be followed up as soon as possible.  Feel free to call the clinic should you have any questions or concerns. The clinic phone number is [phone].  Please show the [place] at check-in to the Emergency Department and triage nurse.

## 2019-08-22 ENCOUNTER — Telehealth: Payer: Self-pay | Admitting: Adult Health

## 2019-08-22 NOTE — Telephone Encounter (Signed)
Scheduled appts per 6/29 los. Pt to get updated appt calendar at next visit per appt notes.

## 2019-08-28 ENCOUNTER — Inpatient Hospital Stay: Payer: Commercial Managed Care - PPO | Attending: Oncology

## 2019-08-28 ENCOUNTER — Other Ambulatory Visit: Payer: Commercial Managed Care - PPO

## 2019-08-28 ENCOUNTER — Ambulatory Visit: Payer: Commercial Managed Care - PPO

## 2019-08-28 ENCOUNTER — Encounter: Payer: Self-pay | Admitting: *Deleted

## 2019-08-28 ENCOUNTER — Inpatient Hospital Stay: Payer: Commercial Managed Care - PPO

## 2019-08-28 ENCOUNTER — Other Ambulatory Visit: Payer: Self-pay | Admitting: Oncology

## 2019-08-28 ENCOUNTER — Other Ambulatory Visit: Payer: Self-pay

## 2019-08-28 VITALS — BP 112/74 | HR 69 | Temp 98.5°F | Resp 18 | Ht 68.0 in | Wt 142.0 lb

## 2019-08-28 DIAGNOSIS — C50412 Malignant neoplasm of upper-outer quadrant of left female breast: Secondary | ICD-10-CM | POA: Diagnosis present

## 2019-08-28 DIAGNOSIS — Z803 Family history of malignant neoplasm of breast: Secondary | ICD-10-CM | POA: Diagnosis not present

## 2019-08-28 DIAGNOSIS — Z171 Estrogen receptor negative status [ER-]: Secondary | ICD-10-CM

## 2019-08-28 DIAGNOSIS — Z95828 Presence of other vascular implants and grafts: Secondary | ICD-10-CM

## 2019-08-28 DIAGNOSIS — Z5111 Encounter for antineoplastic chemotherapy: Secondary | ICD-10-CM | POA: Insufficient documentation

## 2019-08-28 DIAGNOSIS — R110 Nausea: Secondary | ICD-10-CM | POA: Insufficient documentation

## 2019-08-28 DIAGNOSIS — C50211 Malignant neoplasm of upper-inner quadrant of right female breast: Secondary | ICD-10-CM

## 2019-08-28 DIAGNOSIS — C50911 Malignant neoplasm of unspecified site of right female breast: Secondary | ICD-10-CM

## 2019-08-28 DIAGNOSIS — Z79899 Other long term (current) drug therapy: Secondary | ICD-10-CM | POA: Diagnosis not present

## 2019-08-28 DIAGNOSIS — Z1501 Genetic susceptibility to malignant neoplasm of breast: Secondary | ICD-10-CM

## 2019-08-28 DIAGNOSIS — Z9079 Acquired absence of other genital organ(s): Secondary | ICD-10-CM | POA: Insufficient documentation

## 2019-08-28 DIAGNOSIS — Z818 Family history of other mental and behavioral disorders: Secondary | ICD-10-CM | POA: Diagnosis not present

## 2019-08-28 DIAGNOSIS — Z9013 Acquired absence of bilateral breasts and nipples: Secondary | ICD-10-CM | POA: Diagnosis not present

## 2019-08-28 LAB — CBC WITH DIFFERENTIAL/PLATELET
Abs Immature Granulocytes: 0.34 10*3/uL — ABNORMAL HIGH (ref 0.00–0.07)
Basophils Absolute: 0.1 10*3/uL (ref 0.0–0.1)
Basophils Relative: 2 %
Eosinophils Absolute: 0 10*3/uL (ref 0.0–0.5)
Eosinophils Relative: 0 %
HCT: 28 % — ABNORMAL LOW (ref 36.0–46.0)
Hemoglobin: 9.4 g/dL — ABNORMAL LOW (ref 12.0–15.0)
Immature Granulocytes: 7 %
Lymphocytes Relative: 27 %
Lymphs Abs: 1.3 10*3/uL (ref 0.7–4.0)
MCH: 31.6 pg (ref 26.0–34.0)
MCHC: 33.6 g/dL (ref 30.0–36.0)
MCV: 94.3 fL (ref 80.0–100.0)
Monocytes Absolute: 0.4 10*3/uL (ref 0.1–1.0)
Monocytes Relative: 8 %
Neutro Abs: 2.8 10*3/uL (ref 1.7–7.7)
Neutrophils Relative %: 56 %
Platelets: 141 10*3/uL — ABNORMAL LOW (ref 150–400)
RBC: 2.97 MIL/uL — ABNORMAL LOW (ref 3.87–5.11)
RDW: 15.8 % — ABNORMAL HIGH (ref 11.5–15.5)
WBC: 5 10*3/uL (ref 4.0–10.5)
nRBC: 0 % (ref 0.0–0.2)

## 2019-08-28 LAB — COMPREHENSIVE METABOLIC PANEL
ALT: 49 U/L — ABNORMAL HIGH (ref 0–44)
AST: 32 U/L (ref 15–41)
Albumin: 4.3 g/dL (ref 3.5–5.0)
Alkaline Phosphatase: 68 U/L (ref 38–126)
Anion gap: 10 (ref 5–15)
BUN: 9 mg/dL (ref 6–20)
CO2: 21 mmol/L — ABNORMAL LOW (ref 22–32)
Calcium: 9.4 mg/dL (ref 8.9–10.3)
Chloride: 106 mmol/L (ref 98–111)
Creatinine, Ser: 0.78 mg/dL (ref 0.44–1.00)
GFR calc Af Amer: >60 mL/min (ref >60)
GFR calc non Af Amer: >60 mL/min (ref >60)
Glucose, Bld: 118 mg/dL — ABNORMAL HIGH (ref 70–99)
Potassium: 4.3 mmol/L (ref 3.5–5.1)
Sodium: 137 mmol/L (ref 135–145)
Total Bilirubin: 0.3 mg/dL (ref 0.3–1.2)
Total Protein: 6.9 g/dL (ref 6.5–8.1)

## 2019-08-28 MED ORDER — SODIUM CHLORIDE 0.9 % IV SOLN
652.5000 mg | Freq: Once | INTRAVENOUS | Status: AC
  Administered 2019-08-28: 650 mg via INTRAVENOUS
  Filled 2019-08-28: qty 65

## 2019-08-28 MED ORDER — PALONOSETRON HCL INJECTION 0.25 MG/5ML
0.2500 mg | Freq: Once | INTRAVENOUS | Status: AC
  Administered 2019-08-28: 0.25 mg via INTRAVENOUS

## 2019-08-28 MED ORDER — DEXAMETHASONE SODIUM PHOSPHATE 10 MG/ML IJ SOLN
4.0000 mg | Freq: Once | INTRAMUSCULAR | Status: AC
  Administered 2019-08-28: 4 mg via INTRAVENOUS

## 2019-08-28 MED ORDER — PALONOSETRON HCL INJECTION 0.25 MG/5ML
INTRAVENOUS | Status: AC
  Filled 2019-08-28: qty 5

## 2019-08-28 MED ORDER — PACLITAXEL PROTEIN-BOUND CHEMO INJECTION 100 MG
100.0000 mg/m2 | Freq: Once | INTRAVENOUS | Status: AC
  Administered 2019-08-28: 175 mg via INTRAVENOUS
  Filled 2019-08-28: qty 35

## 2019-08-28 MED ORDER — SODIUM CHLORIDE 0.9 % IV SOLN
Freq: Once | INTRAVENOUS | Status: AC
  Filled 2019-08-28: qty 250

## 2019-08-28 MED ORDER — SODIUM CHLORIDE 0.9% FLUSH
10.0000 mL | INTRAVENOUS | Status: DC | PRN
  Administered 2019-08-28: 10 mL
  Filled 2019-08-28: qty 10

## 2019-08-28 MED ORDER — SODIUM CHLORIDE 0.9 % IV SOLN
10.0000 mg | Freq: Once | INTRAVENOUS | Status: DC
  Filled 2019-08-28: qty 1

## 2019-08-28 MED ORDER — DEXAMETHASONE SODIUM PHOSPHATE 10 MG/ML IJ SOLN
INTRAMUSCULAR | Status: AC
  Filled 2019-08-28: qty 1

## 2019-08-28 MED ORDER — SODIUM CHLORIDE 0.9 % IV SOLN
150.0000 mg | Freq: Once | INTRAVENOUS | Status: AC
  Administered 2019-08-28: 150 mg via INTRAVENOUS
  Filled 2019-08-28: qty 150

## 2019-08-28 MED ORDER — SODIUM CHLORIDE 0.9% FLUSH
10.0000 mL | Freq: Once | INTRAVENOUS | Status: AC
  Administered 2019-08-28: 10 mL
  Filled 2019-08-28: qty 10

## 2019-08-28 MED ORDER — HEPARIN SOD (PORK) LOCK FLUSH 100 UNIT/ML IV SOLN
500.0000 [IU] | Freq: Once | INTRAVENOUS | Status: AC | PRN
  Administered 2019-08-28: 500 [IU]
  Filled 2019-08-28: qty 5

## 2019-08-28 NOTE — Patient Instructions (Signed)
[facility] Discharge Instructions for Patients Receiving Chemotherapy  Today you received the following chemotherapy agents: abraxane and carboplatin.  To help prevent nausea and vomiting after your treatment, we encourage you to take your nausea medication as directed.   If you develop nausea and vomiting that is not controlled by your nausea medication, call the clinic.   BELOW ARE SYMPTOMS THAT SHOULD BE REPORTED IMMEDIATELY:  *FEVER GREATER THAN 100.5 F  *CHILLS WITH OR WITHOUT FEVER  NAUSEA AND VOMITING THAT IS NOT CONTROLLED WITH YOUR NAUSEA MEDICATION  *UNUSUAL SHORTNESS OF BREATH  *UNUSUAL BRUISING OR BLEEDING  TENDERNESS IN MOUTH AND THROAT WITH OR WITHOUT PRESENCE OF ULCERS  *URINARY PROBLEMS  *BOWEL PROBLEMS  UNUSUAL RASH Items with * indicate a potential emergency and should be followed up as soon as possible.  Feel free to call the clinic should you have any questions or concerns. The clinic phone number is [phone].  Please show the [facility] at check-in to the Emergency Department and triage nurse.  Nanoparticle Albumin-Bound Paclitaxel injection What is this medicine? NANOPARTICLE ALBUMIN-BOUND PACLITAXEL (Na no PAHR ti kuhl al BYOO muhn-bound PAK li TAX el) is a chemotherapy drug. It targets fast dividing cells, like cancer cells, and causes these cells to die. This medicine is used to treat advanced breast cancer, lung cancer, and pancreatic cancer. This medicine may be used for other purposes; ask your health care provider or pharmacist if you have questions. COMMON BRAND NAME(S): Abraxane What should I tell my health care provider before I take this medicine? They need to know if you have any of these conditions:  kidney disease  liver disease  low blood counts, like low white cell, platelet, or red cell counts  lung or breathing disease, like asthma  tingling of the fingers or toes, or other nerve disorder  an  unusual or allergic reaction to paclitaxel, albumin, other chemotherapy, other medicines, foods, dyes, or preservatives  pregnant or trying to get pregnant  breast-feeding How should I use this medicine? This drug is given as an infusion into a vein. It is administered in a hospital or clinic by a specially trained health care professional. Talk to your pediatrician regarding the use of this medicine in children. Special care may be needed. Overdosage: If you think you have taken too much of this medicine contact a poison control center or emergency room at once. NOTE: This medicine is only for you. Do not share this medicine with others. What if I miss a dose? It is important not to miss your dose. Call your doctor or health care professional if you are unable to keep an appointment. What may interact with this medicine? This medicine may interact with the following medications:  antiviral medicines for hepatitis, HIV or AIDS  certain antibiotics like erythromycin and clarithromycin  certain medicines for fungal infections like ketoconazole and itraconazole  certain medicines for seizures like carbamazepine, phenobarbital, phenytoin  gemfibrozil  nefazodone  rifampin  St. John's wort This list may not describe all possible interactions. Give your health care provider a list of all the medicines, herbs, non-prescription drugs, or dietary supplements you use. Also tell them if you smoke, drink alcohol, or use illegal drugs. Some items may interact with your medicine. What should I watch for while using this medicine? Your condition will be monitored carefully while you are receiving this medicine. You will need important blood work done while you are taking this medicine. This medicine can cause  serious allergic reactions. If you experience allergic reactions like skin rash, itching or hives, swelling of the face, lips, or tongue, tell your doctor or health care professional right  away. In some cases, you may be given additional medicines to help with side effects. Follow all directions for their use. This drug may make you feel generally unwell. This is not uncommon, as chemotherapy can affect healthy cells as well as cancer cells. Report any side effects. Continue your course of treatment even though you feel ill unless your doctor tells you to stop. Call your doctor or health care professional for advice if you get a fever, chills or sore throat, or other symptoms of a cold or flu. Do not treat yourself. This drug decreases your body's ability to fight infections. Try to avoid being around people who are sick. This medicine may increase your risk to bruise or bleed. Call your doctor or health care professional if you notice any unusual bleeding. Be careful brushing and flossing your teeth or using a toothpick because you may get an infection or bleed more easily. If you have any dental work done, tell your dentist you are receiving this medicine. Avoid taking products that contain aspirin, acetaminophen, ibuprofen, naproxen, or ketoprofen unless instructed by your doctor. These medicines may hide a fever. Do not become pregnant while taking this medicine or for 6 months after stopping it. Women should inform their doctor if they wish to become pregnant or think they might be pregnant. Men should not father a child while taking this medicine or for 3 months after stopping it. There is a potential for serious side effects to an unborn child. Talk to your health care professional or pharmacist for more information. Do not breast-feed an infant while taking this medicine or for 2 weeks after stopping it. This medicine may interfere with the ability to get pregnant or to father a child. You should talk to your doctor or health care professional if you are concerned about your fertility. What side effects may I notice from receiving this medicine? Side effects that you should report  to your doctor or health care professional as soon as possible:  allergic reactions like skin rash, itching or hives, swelling of the face, lips, or tongue  breathing problems  changes in vision  fast, irregular heartbeat  low blood pressure  mouth sores  pain, tingling, numbness in the hands or feet  signs of decreased platelets or bleeding - bruising, pinpoint red spots on the skin, black, tarry stools, blood in the urine  signs of decreased red blood cells - unusually weak or tired, feeling faint or lightheaded, falls  signs of infection - fever or chills, cough, sore throat, pain or difficulty passing urine  signs and symptoms of liver injury like dark yellow or brown urine; general ill feeling or flu-like symptoms; light-colored stools; loss of appetite; nausea; right upper belly pain; unusually weak or tired; yellowing of the eyes or skin  swelling of the ankles, feet, hands  unusually slow heartbeat Side effects that usually do not require medical attention (report to your doctor or health care professional if they continue or are bothersome):  diarrhea  hair loss  loss of appetite  nausea, vomiting  tiredness This list may not describe all possible side effects. Call your doctor for medical advice about side effects. You may report side effects to FDA at 1-800-FDA-1088. Where should I keep my medicine? This drug is given in a hospital or clinic and  will not be stored at home. NOTE: This sheet is a summary. It may not cover all possible information. If you have questions about this medicine, talk to your doctor, pharmacist, or health care provider.  2020 Elsevier/Gold Standard (2016-10-12 13:03:45)  Carboplatin injection What is this medicine? CARBOPLATIN (KAR boe pla tin) is a chemotherapy drug. It targets fast dividing cells, like cancer cells, and causes these cells to die. This medicine is used to treat ovarian cancer and many other cancers. This medicine  may be used for other purposes; ask your health care provider or pharmacist if you have questions. COMMON BRAND NAME(S): Paraplatin What should I tell my health care provider before I take this medicine? They need to know if you have any of these conditions:  blood disorders  hearing problems  kidney disease  recent or ongoing radiation therapy  an unusual or allergic reaction to carboplatin, cisplatin, other chemotherapy, other medicines, foods, dyes, or preservatives  pregnant or trying to get pregnant  breast-feeding How should I use this medicine? This drug is usually given as an infusion into a vein. It is administered in a hospital or clinic by a specially trained health care professional. Talk to your pediatrician regarding the use of this medicine in children. Special care may be needed. Overdosage: If you think you have taken too much of this medicine contact a poison control center or emergency room at once. NOTE: This medicine is only for you. Do not share this medicine with others. What if I miss a dose? It is important not to miss a dose. Call your doctor or health care professional if you are unable to keep an appointment. What may interact with this medicine?  medicines for seizures  medicines to increase blood counts like filgrastim, pegfilgrastim, sargramostim  some antibiotics like amikacin, gentamicin, neomycin, streptomycin, tobramycin  vaccines Talk to your doctor or health care professional before taking any of these medicines:  acetaminophen  aspirin  ibuprofen  ketoprofen  naproxen This list may not describe all possible interactions. Give your health care provider a list of all the medicines, herbs, non-prescription drugs, or dietary supplements you use. Also tell them if you smoke, drink alcohol, or use illegal drugs. Some items may interact with your medicine. What should I watch for while using this medicine? Your condition will be monitored  carefully while you are receiving this medicine. You will need important blood work done while you are taking this medicine. This drug may make you feel generally unwell. This is not uncommon, as chemotherapy can affect healthy cells as well as cancer cells. Report any side effects. Continue your course of treatment even though you feel ill unless your doctor tells you to stop. In some cases, you may be given additional medicines to help with side effects. Follow all directions for their use. Call your doctor or health care professional for advice if you get a fever, chills or sore throat, or other symptoms of a cold or flu. Do not treat yourself. This drug decreases your body's ability to fight infections. Try to avoid being around people who are sick. This medicine may increase your risk to bruise or bleed. Call your doctor or health care professional if you notice any unusual bleeding. Be careful brushing and flossing your teeth or using a toothpick because you may get an infection or bleed more easily. If you have any dental work done, tell your dentist you are receiving this medicine. Avoid taking products that contain aspirin, acetaminophen,  ibuprofen, naproxen, or ketoprofen unless instructed by your doctor. These medicines may hide a fever. Do not become pregnant while taking this medicine. Women should inform their doctor if they wish to become pregnant or think they might be pregnant. There is a potential for serious side effects to an unborn child. Talk to your health care professional or pharmacist for more information. Do not breast-feed an infant while taking this medicine. What side effects may I notice from receiving this medicine? Side effects that you should report to your doctor or health care professional as soon as possible:  allergic reactions like skin rash, itching or hives, swelling of the face, lips, or tongue  signs of infection - fever or chills, cough, sore throat, pain or  difficulty passing urine  signs of decreased platelets or bleeding - bruising, pinpoint red spots on the skin, black, tarry stools, nosebleeds  signs of decreased red blood cells - unusually weak or tired, fainting spells, lightheadedness  breathing problems  changes in hearing  changes in vision  chest pain  high blood pressure  low blood counts - This drug may decrease the number of white blood cells, red blood cells and platelets. You may be at increased risk for infections and bleeding.  nausea and vomiting  pain, swelling, redness or irritation at the injection site  pain, tingling, numbness in the hands or feet  problems with balance, talking, walking  trouble passing urine or change in the amount of urine Side effects that usually do not require medical attention (report to your doctor or health care professional if they continue or are bothersome):  hair loss  loss of appetite  metallic taste in the mouth or changes in taste This list may not describe all possible side effects. Call your doctor for medical advice about side effects. You may report side effects to FDA at 1-800-FDA-1088. Where should I keep my medicine? This drug is given in a hospital or clinic and will not be stored at home. NOTE: This sheet is a summary. It may not cover all possible information. If you have questions about this medicine, talk to your doctor, pharmacist, or health care provider.  2020 Elsevier/Gold Standard (2007-05-16 14:38:05)

## 2019-08-28 NOTE — Progress Notes (Signed)
I saw Joy Reyes in the treatment area.  Amazingly she still has her hair.  She has picked up a mild virus from one of her children.  Neither the child nor she have any fever.  She does not have any cough or shortness of breath.  She has had runny nose primarily.  I suggested she try Claritin for that.  We are dropping her Decadron to 4 mg on the day she receives [place].  She does not get any on the days she only receives Abraxane

## 2019-08-28 NOTE — Progress Notes (Signed)
Pt reports nasal congestion and a tickle in her throat. No cough, fever or chills. Dr. Jana Hakim notified. He advised pt to take Claritin and to call him if she starts to feel worse. OK to treat today.

## 2019-09-04 ENCOUNTER — Encounter: Payer: Self-pay | Admitting: Nurse Practitioner

## 2019-09-04 ENCOUNTER — Inpatient Hospital Stay (HOSPITAL_BASED_OUTPATIENT_CLINIC_OR_DEPARTMENT_OTHER): Payer: Commercial Managed Care - PPO | Admitting: Nurse Practitioner

## 2019-09-04 ENCOUNTER — Inpatient Hospital Stay: Payer: Commercial Managed Care - PPO

## 2019-09-04 ENCOUNTER — Other Ambulatory Visit: Payer: Self-pay

## 2019-09-04 ENCOUNTER — Encounter: Payer: Self-pay | Admitting: *Deleted

## 2019-09-04 ENCOUNTER — Other Ambulatory Visit: Payer: Commercial Managed Care - PPO

## 2019-09-04 ENCOUNTER — Ambulatory Visit: Payer: Commercial Managed Care - PPO

## 2019-09-04 VITALS — BP 117/73 | HR 69 | Temp 97.9°F | Resp 17 | Ht 68.0 in | Wt 141.7 lb

## 2019-09-04 DIAGNOSIS — C50211 Malignant neoplasm of upper-inner quadrant of right female breast: Secondary | ICD-10-CM

## 2019-09-04 DIAGNOSIS — C50412 Malignant neoplasm of upper-outer quadrant of left female breast: Secondary | ICD-10-CM

## 2019-09-04 DIAGNOSIS — Z1501 Genetic susceptibility to malignant neoplasm of breast: Secondary | ICD-10-CM | POA: Diagnosis not present

## 2019-09-04 DIAGNOSIS — C50911 Malignant neoplasm of unspecified site of right female breast: Secondary | ICD-10-CM | POA: Diagnosis not present

## 2019-09-04 DIAGNOSIS — Z171 Estrogen receptor negative status [ER-]: Secondary | ICD-10-CM

## 2019-09-04 DIAGNOSIS — Z95828 Presence of other vascular implants and grafts: Secondary | ICD-10-CM

## 2019-09-04 DIAGNOSIS — Z5111 Encounter for antineoplastic chemotherapy: Secondary | ICD-10-CM | POA: Diagnosis not present

## 2019-09-04 LAB — COMPREHENSIVE METABOLIC PANEL
ALT: 31 U/L (ref 0–44)
AST: 20 U/L (ref 15–41)
Albumin: 4.1 g/dL (ref 3.5–5.0)
Alkaline Phosphatase: 67 U/L (ref 38–126)
Anion gap: 8 (ref 5–15)
BUN: 10 mg/dL (ref 6–20)
CO2: 24 mmol/L (ref 22–32)
Calcium: 9.6 mg/dL (ref 8.9–10.3)
Chloride: 108 mmol/L (ref 98–111)
Creatinine, Ser: 0.66 mg/dL (ref 0.44–1.00)
GFR calc Af Amer: >60 mL/min (ref >60)
GFR calc non Af Amer: >60 mL/min (ref >60)
Glucose, Bld: 108 mg/dL — ABNORMAL HIGH (ref 70–99)
Potassium: 4.2 mmol/L (ref 3.5–5.1)
Sodium: 140 mmol/L (ref 135–145)
Total Bilirubin: 0.5 mg/dL (ref 0.3–1.2)
Total Protein: 6.6 g/dL (ref 6.5–8.1)

## 2019-09-04 LAB — CBC WITH DIFFERENTIAL/PLATELET
Abs Immature Granulocytes: 0.01 10*3/uL (ref 0.00–0.07)
Basophils Absolute: 0 10*3/uL (ref 0.0–0.1)
Basophils Relative: 0 %
Eosinophils Absolute: 0 10*3/uL (ref 0.0–0.5)
Eosinophils Relative: 0 %
HCT: 24.8 % — ABNORMAL LOW (ref 36.0–46.0)
Hemoglobin: 8.5 g/dL — ABNORMAL LOW (ref 12.0–15.0)
Immature Granulocytes: 0 %
Lymphocytes Relative: 36 %
Lymphs Abs: 0.8 10*3/uL (ref 0.7–4.0)
MCH: 32.6 pg (ref 26.0–34.0)
MCHC: 34.3 g/dL (ref 30.0–36.0)
MCV: 95 fL (ref 80.0–100.0)
Monocytes Absolute: 0.2 10*3/uL (ref 0.1–1.0)
Monocytes Relative: 9 %
Neutro Abs: 1.2 10*3/uL — ABNORMAL LOW (ref 1.7–7.7)
Neutrophils Relative %: 55 %
Platelets: 184 10*3/uL (ref 150–400)
RBC: 2.61 MIL/uL — ABNORMAL LOW (ref 3.87–5.11)
RDW: 16 % — ABNORMAL HIGH (ref 11.5–15.5)
WBC: 2.2 10*3/uL — ABNORMAL LOW (ref 4.0–10.5)
nRBC: 0 % (ref 0.0–0.2)

## 2019-09-04 MED ORDER — SODIUM CHLORIDE 0.9 % IV SOLN
Freq: Once | INTRAVENOUS | Status: AC
  Filled 2019-09-04: qty 250

## 2019-09-04 MED ORDER — SODIUM CHLORIDE 0.9% FLUSH
10.0000 mL | Freq: Once | INTRAVENOUS | Status: AC
  Administered 2019-09-04: 10 mL
  Filled 2019-09-04: qty 10

## 2019-09-04 MED ORDER — PACLITAXEL PROTEIN-BOUND CHEMO INJECTION 100 MG
100.0000 mg/m2 | Freq: Once | INTRAVENOUS | Status: AC
  Administered 2019-09-04: 175 mg via INTRAVENOUS
  Filled 2019-09-04: qty 35

## 2019-09-04 MED ORDER — PROCHLORPERAZINE MALEATE 10 MG PO TABS
ORAL_TABLET | ORAL | Status: AC
  Filled 2019-09-04: qty 1

## 2019-09-04 MED ORDER — SODIUM CHLORIDE 0.9% FLUSH
10.0000 mL | INTRAVENOUS | Status: DC | PRN
  Administered 2019-09-04: 10 mL
  Filled 2019-09-04: qty 10

## 2019-09-04 MED ORDER — HEPARIN SOD (PORK) LOCK FLUSH 100 UNIT/ML IV SOLN
500.0000 [IU] | Freq: Once | INTRAVENOUS | Status: AC | PRN
  Administered 2019-09-04: 500 [IU]
  Filled 2019-09-04: qty 5

## 2019-09-04 MED ORDER — PROCHLORPERAZINE MALEATE 10 MG PO TABS
10.0000 mg | ORAL_TABLET | Freq: Once | ORAL | Status: AC
  Administered 2019-09-04: 10 mg via ORAL

## 2019-09-04 NOTE — Patient Instructions (Signed)
[facility] Discharge Instructions for Patients Receiving Chemotherapy  Today you received the following chemotherapy agents: protein-bound paclitaxel (Abraxane)  To help prevent nausea and vomiting after your treatment, we encourage you to take your nausea medication as directed.   If you develop nausea and vomiting that is not controlled by your nausea medication, call the clinic.   BELOW ARE SYMPTOMS THAT SHOULD BE REPORTED IMMEDIATELY:  *FEVER GREATER THAN 100.5 F  *CHILLS WITH OR WITHOUT FEVER  NAUSEA AND VOMITING THAT IS NOT CONTROLLED WITH YOUR NAUSEA MEDICATION  *UNUSUAL SHORTNESS OF BREATH  *UNUSUAL BRUISING OR BLEEDING  TENDERNESS IN MOUTH AND THROAT WITH OR WITHOUT PRESENCE OF ULCERS  *URINARY PROBLEMS  *BOWEL PROBLEMS  UNUSUAL RASH Items with * indicate a potential emergency and should be followed up as soon as possible.  Feel free to call the clinic should you have any questions or concerns. The clinic phone number is [phone].  Please show the [facility] at check-in to the Emergency Department and triage nurse.

## 2019-09-04 NOTE — Progress Notes (Signed)
Joy Reyes   [phone] [phone]) [phone]   Clinic Follow up Note   Patient Care Team: System, Pcp Not In as PCP - General [name], [name], MD as Consulting Physician (Oncology) Fulton Reek, MD as Referring Physician (Obstetrics and Gynecology) [name], RN as Oncology Nurse Navigator [name], RN as Oncology Nurse Navigator [name], MD as Consulting Physician (Plastic Surgery) 09/04/2019  CHIEF COMPLAINT: Follow-up BRCA positive left breast cancer  CURRENT THERAPY: neoadjuvant chemotherapy consisting of carboplatin day 1, Abraxane days 1,8,15, repeated Q21 days x 4 cycles, on 07/10/2019, self injecting Nivestym 444mg on days 3, 4, 5  INTERVAL HISTORY: Ms. WStephanireturns for follow-up and treatment as scheduled.  She completed cycle 3-day 1 carbo and Abraxane on 08/28/2019.  Treatment is going well overall although some side effects last longer.  She has moderate fatigue required a nap 2 days after last chemo.  She still remains completely functional and active, does not miss a workout.  She had little more nausea on days 3 and 4, required Compazine which is partially effective.  She does not like Zofran or steroids.  She fasts for 48 hours before chemo, otherwise eats and drinks well.  Denies mucositis.  Denies fever, chills, constipation, diarrhea, bleeding, rash, neuropathy.  Her breast mass is smaller.   MEDICAL HISTORY:  Past Medical History:  Diagnosis Date  . Cancer ([facility]    breast cancer 2012    SURGICAL HISTORY: Past Surgical History:  Procedure Laterality Date  . BREAST SURGERY     lumpectomy on right breast  . CESAREAN SECTION MULTI-GESTATIONAL N/A 06/01/2015   Procedure: CESAREAN SECTION MULTI-GESTATIONAL;  Surgeon: EWaymon Amato MD;  [facility];  Service: Obstetrics;  Laterality: N/A;  . IR IMAGING GUIDED PORT INSERTION  07/05/2019  . LOBECTOMY Right 2012  . SALPINGECTOMY      I have reviewed the social  history and family history with the patient and they are unchanged from previous note.  ALLERGIES:  is allergic to paclitaxel.  MEDICATIONS:  Current Outpatient Medications  Medication Sig Dispense Refill  . dexamethasone (DECADRON) 4 MG tablet Take 2 tablets (8 mg total) by mouth daily. Start the day after carboplatin chemotherapy (day 1 of each 21 days cycle) and continue for 2 days (days 2 and 3, counting chemotherapy day as day 1);  last dose the morning of day 4. 30 tablet 1  . filgrastim-aafi (NIVESTYM) 480 MCG/0.8ML SOSY injection Pt to self administered 480 mcg SQ on days 3,4 and 5 of weekly chemotherapy x 9 weeks 21.6 mL 0  . lidocaine-prilocaine (EMLA) cream Apply to affected area once 30 g 3  . LORazepam (ATIVAN) 0.5 MG tablet Take 1 tablet (0.5 mg total) by mouth at bedtime as needed (Nausea or vomiting). 20 tablet 0  . prochlorperazine (COMPAZINE) 10 MG tablet Take 1 tablet (10 mg total) by mouth every 6 (six) hours as needed (Nausea or vomiting). 30 tablet 1   No current facility-administered medications for this visit.   Facility-Administered Medications Ordered in Other Visits  Medication Dose Route Frequency Provider Last Rate Last Admin  . heparin lock flush 100 unit/mL  500 Units Intracatheter Once PRN [name], GVirgie Dad MD      . PACLitaxel-protein bound (ABRAXANE) chemo infusion 175 mg  100 mg/m2 (Treatment Plan Recorded) Intravenous Once [name], GVirgie Dad MD      . sodium chloride flush (NS) 0.9 % injection 10 mL  10 mL Intracatheter PRN [name],  [name], MD        PHYSICAL EXAMINATION: ECOG PERFORMANCE STATUS: 1 - Symptomatic but completely ambulatory  Vitals:   09/04/19 0840  BP: 117/73  Pulse: 69  Resp: 17  Temp: 97.9 F (36.6 C)  SpO2: 100%   Filed Weights   09/04/19 0840  Weight: 141 lb 11.2 oz (64.3 kg)    GENERAL:alert, no distress and comfortable SKIN: No rash to exposed skin  EYES: sclera clear NECK: Without mass LUNGS: clear with normal  breathing effort HEART: regular rate & rhythm, no lower extremity edema NEURO: alert & oriented x 3 with fluent speech, no focal motor/sensory deficits PAC without erythema Breast: Soft tissue density in the left upper breast without palpable mass.  Small left axillary nodes still palpable.  Right breast benign  LABORATORY DATA:  I have reviewed the data as listed CBC Latest Ref Rng & Units 09/04/2019 08/28/2019 08/21/2019  WBC 4.0 - 10.5 K/uL 2.2(L) 5.0 3.8(L)  Hemoglobin 12.0 - 15.0 g/dL 8.5(L) 9.4(L) 9.3(L)  Hematocrit 36 - 46 % 24.8(L) 28.0(L) 27.9(L)  Platelets 150 - 400 K/uL 184 141(L) 120(L)     CMP Latest Ref Rng & Units 09/04/2019 08/28/2019 08/21/2019  Glucose 70 - 99 mg/dL 108(H) 118(H) 117(H)  BUN 6 - 20 mg/dL 10 9 12   Creatinine 0.44 - 1.00 mg/dL 0.66 0.78 0.73  Sodium 135 - 145 mmol/L 140 137 140  Potassium 3.5 - 5.1 mmol/L 4.2 4.3 4.0  Chloride 98 - 111 mmol/L 108 106 110  CO2 22 - 32 mmol/L 24 21(L) 21(L)  Calcium 8.9 - 10.3 mg/dL 9.6 9.4 9.0  Total Protein 6.5 - 8.1 g/dL 6.6 6.9 6.4(L)  Total Bilirubin 0.3 - 1.2 mg/dL 0.5 0.3 <0.2(L)  Alkaline Phos 38 - 126 U/L 67 68 50  AST 15 - 41 U/L 20 32 29  ALT 0 - 44 U/L 31 49(H) 43      RADIOGRAPHIC STUDIES: I have personally reviewed the radiological images as listed and agreed with the findings in the report. No results found.   ASSESSMENT & PLAN: 34 year old female with  1.  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  Staging work-up negative for metastatic disease -Began neoadjuvant chemo consisting of carboplatin day one, Abraxane days one, eight, 15 q. 21 days x 4 cycles starting 07/10/2019 2.  History of right breast cancer in 2012 -s/p neoadjuvant chemo with doxorubicin and cyclophosphamide x4 followed by paclitaxel x1 then changed to docetaxel x4 after allergic reaction.   -She did not receive adjuvant  radiation following definitive lumpectomy  3. BRCA mutation (+)  -S/p bilateral salpingectomy 11/28/2013 at age 42, no oophorectomy.  Pelvic ultrasound and Ca1 25 unremarkable on 06/01/2019 -Planning bilateral mastectomies  Disposition: Ms. Lafavor appears stable.  She completed cycle 3-day one neoadjuvant chemo.  She tolerates treatment well with moderate fatigue and nausea on days 3-4 after treatment.  Symptoms are managed with supportive care at home.  She is able to function normally and recover well.  The left breast mass is not palpable, she does still have small but palpable left axillary adenopathy.  Overall she appears to be clinically responding to treatment.  CBC and CMP reviewed, ANC 1.2, Hgb 8.5, secondary to chemo.  We reviewed neutropenic precautions.  Anticipate she may need RBC transfusion in the future, will draw extra tube next week. She will proceed with cycle 3-day chemo today with  Abraxane and continue home injection with Nivestym on days 3, 4, and 5.  She will return for follow-up and cycle 3-day 15 Abraxane on 09/11/2019 then f/u and cycle 4-day 1 carbo/Abraxane on 7/27.  She knows to call clinic if she develops a fever, chills, or other concerning signs of infection.    All questions were answered. The patient knows to call the clinic with any problems, questions or concerns. No barriers to learning was detected.     Alla Feeling, NP 09/04/19

## 2019-09-05 ENCOUNTER — Telehealth: Payer: Self-pay | Admitting: Nurse Practitioner

## 2019-09-05 NOTE — Telephone Encounter (Signed)
Unable to move appt earlier on 7/27 per 7/13 los. Unable to reach pt. No voicemail set up.

## 2019-09-07 ENCOUNTER — Ambulatory Visit: Payer: Commercial Managed Care - PPO

## 2019-09-11 ENCOUNTER — Inpatient Hospital Stay: Payer: Commercial Managed Care - PPO

## 2019-09-11 ENCOUNTER — Other Ambulatory Visit: Payer: Self-pay

## 2019-09-11 ENCOUNTER — Encounter: Payer: Self-pay | Admitting: *Deleted

## 2019-09-11 ENCOUNTER — Inpatient Hospital Stay (HOSPITAL_BASED_OUTPATIENT_CLINIC_OR_DEPARTMENT_OTHER): Payer: Commercial Managed Care - PPO | Admitting: Adult Health

## 2019-09-11 ENCOUNTER — Encounter: Payer: Self-pay | Admitting: Adult Health

## 2019-09-11 VITALS — BP 110/66 | HR 78 | Temp 98.8°F | Resp 18 | Ht 68.0 in | Wt 143.7 lb

## 2019-09-11 DIAGNOSIS — Z171 Estrogen receptor negative status [ER-]: Secondary | ICD-10-CM

## 2019-09-11 DIAGNOSIS — C50211 Malignant neoplasm of upper-inner quadrant of right female breast: Secondary | ICD-10-CM | POA: Diagnosis not present

## 2019-09-11 DIAGNOSIS — C50412 Malignant neoplasm of upper-outer quadrant of left female breast: Secondary | ICD-10-CM | POA: Diagnosis not present

## 2019-09-11 DIAGNOSIS — Z5111 Encounter for antineoplastic chemotherapy: Secondary | ICD-10-CM | POA: Diagnosis not present

## 2019-09-11 DIAGNOSIS — C50911 Malignant neoplasm of unspecified site of right female breast: Secondary | ICD-10-CM

## 2019-09-11 DIAGNOSIS — Z1501 Genetic susceptibility to malignant neoplasm of breast: Secondary | ICD-10-CM

## 2019-09-11 DIAGNOSIS — Z95828 Presence of other vascular implants and grafts: Secondary | ICD-10-CM

## 2019-09-11 LAB — COMPREHENSIVE METABOLIC PANEL
ALT: 74 U/L — ABNORMAL HIGH (ref 0–44)
AST: 47 U/L — ABNORMAL HIGH (ref 15–41)
Albumin: 4.3 g/dL (ref 3.5–5.0)
Alkaline Phosphatase: 65 U/L (ref 38–126)
Anion gap: 10 (ref 5–15)
BUN: 18 mg/dL (ref 6–20)
CO2: 22 mmol/L (ref 22–32)
Calcium: 10.4 mg/dL — ABNORMAL HIGH (ref 8.9–10.3)
Chloride: 109 mmol/L (ref 98–111)
Creatinine, Ser: 0.73 mg/dL (ref 0.44–1.00)
GFR calc Af Amer: >60 mL/min (ref >60)
GFR calc non Af Amer: >60 mL/min (ref >60)
Glucose, Bld: 102 mg/dL — ABNORMAL HIGH (ref 70–99)
Potassium: 4.4 mmol/L (ref 3.5–5.1)
Sodium: 141 mmol/L (ref 135–145)
Total Bilirubin: <0.2 mg/dL — ABNORMAL LOW (ref 0.3–1.2)
Total Protein: 7.1 g/dL (ref 6.5–8.1)

## 2019-09-11 LAB — CBC WITH DIFFERENTIAL/PLATELET
Abs Immature Granulocytes: 0.32 10*3/uL — ABNORMAL HIGH (ref 0.00–0.07)
Basophils Absolute: 0 10*3/uL (ref 0.0–0.1)
Basophils Relative: 1 %
Eosinophils Absolute: 0 10*3/uL (ref 0.0–0.5)
Eosinophils Relative: 1 %
HCT: 23.6 % — ABNORMAL LOW (ref 36.0–46.0)
Hemoglobin: 7.9 g/dL — ABNORMAL LOW (ref 12.0–15.0)
Immature Granulocytes: 9 %
Lymphocytes Relative: 37 %
Lymphs Abs: 1.4 10*3/uL (ref 0.7–4.0)
MCH: 32.2 pg (ref 26.0–34.0)
MCHC: 33.5 g/dL (ref 30.0–36.0)
MCV: 96.3 fL (ref 80.0–100.0)
Monocytes Absolute: 0.5 10*3/uL (ref 0.1–1.0)
Monocytes Relative: 14 %
Neutro Abs: 1.5 10*3/uL — ABNORMAL LOW (ref 1.7–7.7)
Neutrophils Relative %: 38 %
Platelets: 116 10*3/uL — ABNORMAL LOW (ref 150–400)
RBC: 2.45 MIL/uL — ABNORMAL LOW (ref 3.87–5.11)
RDW: 17.7 % — ABNORMAL HIGH (ref 11.5–15.5)
WBC: 3.8 10*3/uL — ABNORMAL LOW (ref 4.0–10.5)
nRBC: 1.3 % — ABNORMAL HIGH (ref 0.0–0.2)

## 2019-09-11 MED ORDER — SODIUM CHLORIDE 0.9 % IV SOLN
Freq: Once | INTRAVENOUS | Status: AC
  Filled 2019-09-11: qty 250

## 2019-09-11 MED ORDER — PACLITAXEL PROTEIN-BOUND CHEMO INJECTION 100 MG
100.0000 mg/m2 | Freq: Once | INTRAVENOUS | Status: AC
  Administered 2019-09-11: 175 mg via INTRAVENOUS
  Filled 2019-09-11: qty 35

## 2019-09-11 MED ORDER — HEPARIN SOD (PORK) LOCK FLUSH 100 UNIT/ML IV SOLN
500.0000 [IU] | Freq: Once | INTRAVENOUS | Status: AC | PRN
  Administered 2019-09-11: 500 [IU]
  Filled 2019-09-11: qty 5

## 2019-09-11 MED ORDER — PROCHLORPERAZINE MALEATE 10 MG PO TABS
ORAL_TABLET | ORAL | Status: AC
  Filled 2019-09-11: qty 1

## 2019-09-11 MED ORDER — SODIUM CHLORIDE 0.9% FLUSH
10.0000 mL | Freq: Once | INTRAVENOUS | Status: AC
  Administered 2019-09-11: 10 mL
  Filled 2019-09-11: qty 10

## 2019-09-11 MED ORDER — SODIUM CHLORIDE 0.9% FLUSH
10.0000 mL | INTRAVENOUS | Status: DC | PRN
  Administered 2019-09-11: 10 mL
  Filled 2019-09-11: qty 10

## 2019-09-11 MED ORDER — PROCHLORPERAZINE MALEATE 10 MG PO TABS
10.0000 mg | ORAL_TABLET | Freq: Once | ORAL | Status: AC
  Administered 2019-09-11: 10 mg via ORAL

## 2019-09-11 NOTE — Patient Instructions (Signed)
Anemia  Anemia is a condition in which you do not have enough red blood cells or hemoglobin. Hemoglobin is a substance in red blood cells that carries oxygen. When you do not have enough red blood cells or hemoglobin (are anemic), your body cannot get enough oxygen and your organs may not work properly. As a result, you may feel very tired or have other problems. What are the causes? Common causes of anemia include:  Excessive bleeding. Anemia can be caused by excessive bleeding inside or outside the body, including bleeding from the intestine or from periods in women.  Poor nutrition.  Long-lasting (chronic) kidney, thyroid, and liver disease.  Bone marrow disorders.  Cancer and treatments for cancer.  HIV (human immunodeficiency virus) and AIDS (acquired immunodeficiency syndrome).  Treatments for HIV and AIDS.  Spleen problems.  Blood disorders.  Infections, medicines, and autoimmune disorders that destroy red blood cells. What are the signs or symptoms? Symptoms of this condition include:  Minor weakness.  Dizziness.  Headache.  Feeling heartbeats that are irregular or faster than normal (palpitations).  Shortness of breath, especially with exercise.  Paleness.  Cold sensitivity.  Indigestion.  Nausea.  Difficulty sleeping.  Difficulty concentrating. Symptoms may occur suddenly or develop slowly. If your anemia is mild, you may not have symptoms. How is this diagnosed? This condition is diagnosed based on:  Blood tests.  Your medical history.  A physical exam.  Bone marrow biopsy. Your health care provider may also check your stool (feces) for blood and may do additional testing to look for the cause of your bleeding. You may also have other tests, including:  Imaging tests, such as a CT scan or MRI.  Endoscopy.  Colonoscopy. How is this treated? Treatment for this condition depends on the cause. If you continue to lose a lot of blood, you may  need to be treated at a hospital. Treatment may include:  Taking supplements of iron, vitamin M08, or folic acid.  Taking a hormone medicine (erythropoietin) that can help to stimulate red blood cell growth.  Having a blood transfusion. This may be needed if you lose a lot of blood.  Making changes to your diet.  Having surgery to remove your spleen. Follow these instructions at home:  Take over-the-counter and prescription medicines only as told by your health care provider.  Take supplements only as told by your health care provider.  Follow any diet instructions that you were given.  Keep all follow-up visits as told by your health care provider. This is important. Contact a health care provider if:  You develop new bleeding anywhere in the body. Get help right away if:  You are very weak.  You are short of breath.  You have pain in your abdomen or chest.  You are dizzy or feel faint.  You have trouble concentrating.  You have bloody or black, tarry stools.  You vomit repeatedly or you vomit up blood. Summary  Anemia is a condition in which you do not have enough red blood cells or enough of a substance in your red blood cells that carries oxygen (hemoglobin).  Symptoms may occur suddenly or develop slowly.  If your anemia is mild, you may not have symptoms.  This condition is diagnosed with blood tests as well as a medical history and physical exam. Other tests may be needed.  Treatment for this condition depends on the cause of the anemia. This information is not intended to replace advice given to you by  your health care provider. Make sure you discuss any questions you have with your health care provider. Document Revised: 01/21/2017 Document Reviewed: 03/12/2016 Elsevier Patient Education  [place]. https://www.redcrossblood.org/donate-blood/blood-donation-process/what-happens-to-donated-blood/blood-transfusions/types-of-blood-transfusions.html">  https://www.hematology.org/education/patients/blood-basics/blood-safety-and-matching"> https://www.nhlbi.nih.gov/health-topics/blood-transfusion">  Blood Transfusion, Adult A blood transfusion is a procedure in which you receive blood or a type of blood cell (blood component) through an IV. You may need a blood transfusion when your blood level is low. This may result from a bleeding disorder, illness, injury, or surgery. The blood may come from a donor. You may also be able to donate blood for yourself (autologous blood donation) before a planned surgery. The blood given in a transfusion is made up of different blood components. You may receive:  Red blood cells. These carry oxygen to the cells in the body.  Platelets. These help your blood to clot.  Plasma. This is the liquid part of your blood. It carries proteins and other substances throughout the body.  White blood cells. These help you fight infections. If you have hemophilia or another clotting disorder, you may also receive other types of blood products. Tell a health care provider about:  Any blood disorders you have.  Any previous reactions you have had during a blood transfusion.  Any allergies you have.  All medicines you are taking, including vitamins, herbs, eye drops, creams, and over-the-counter medicines.  Any surgeries you have had.  Any medical conditions you have, including any recent fever or cold symptoms.  Whether you are pregnant or may be pregnant. What are the risks? Generally, this is a safe procedure. However, problems may occur.  The most common problems include: ? A mild allergic reaction, such as red, swollen areas of skin (hives) and itching. ? Fever or chills. This may be the body's response to new blood cells received. This may occur during or up to 4 hours after the transfusion.  More serious problems may include: ? Transfusion-associated circulatory overload (TACO), or too much fluid in the  lungs. This may cause breathing problems. ? A serious allergic reaction, such as difficulty breathing or swelling around the face and lips. ? Transfusion-related acute lung injury (TRALI), which causes breathing difficulty and low oxygen in the blood. This can occur within hours of the transfusion or several days later. ? Iron overload. This can happen after receiving many blood transfusions over a period of time. ? Infection or virus being transmitted. This is rare because donated blood is carefully tested before it is given. ? Hemolytic transfusion reaction. This is rare. It happens when your body's defense system (immune system)tries to attack the new blood cells. Symptoms may include fever, chills, nausea, low blood pressure, and low back or chest pain. ? Transfusion-associated graft-versus-host disease (TAGVHD). This is rare. It happens when donated cells attack your body's healthy tissues. What happens before the procedure? Medicines Ask your health care provider about:  Changing or stopping your regular medicines. This is especially important if you are taking diabetes medicines or blood thinners.  Taking medicines such as aspirin and ibuprofen. These medicines can thin your blood. Do not take these medicines unless your health care provider tells you to take them.  Taking over-the-counter medicines, vitamins, herbs, and supplements. General instructions  Follow instructions from your health care provider about eating and drinking restrictions.  You will have a blood test to determine your blood type. This is necessary to know what kind of blood your body will accept and to match it to the donor blood.  If you  are going to have a planned surgery, you may be able to do an autologous blood donation. This may be done in case you need to have a transfusion.  You will have your temperature, blood pressure, and pulse monitored before the transfusion.  If you have had an allergic reaction  to a transfusion in the past, you may be given medicine to help prevent a reaction. This medicine may be given to you by mouth (orally) or through an IV.  Set aside time for the blood transfusion. This procedure generally takes 1-4 hours to complete. What happens during the procedure?   An IV will be inserted into one of your veins.  The bag of donated blood will be attached to your IV. The blood will then enter through your vein.  Your temperature, blood pressure, and pulse will be monitored regularly during the transfusion. This monitoring is done to detect early signs of a transfusion reaction.  Tell your nurse right away if you have any of these symptoms during the transfusion: ? Shortness of breath or trouble breathing. ? Chest or back pain. ? Fever or chills. ? Hives or itching.  If you have any signs or symptoms of a reaction, your transfusion will be stopped and you may be given medicine.  When the transfusion is complete, your IV will be removed.  Pressure may be applied to the IV site for a few minutes.  A bandage (dressing)will be applied. The procedure may vary among health care providers and hospitals. What happens after the procedure?  Your temperature, blood pressure, pulse, breathing rate, and blood oxygen level will be monitored until you leave the hospital or clinic.  Your blood may be tested to see how you are responding to the transfusion.  You may be warmed with fluids or blankets to maintain a normal body temperature.  If you receive your blood transfusion in an outpatient setting, you will be told whom to contact to report any reactions. Where to find more information For more information on blood transfusions, visit the American Red Cross: redcross.org Summary  A blood transfusion is a procedure in which you receive blood or a type of blood cell (blood component) through an IV.  The blood you receive may come from a donor or be donated by yourself  (autologous blood donation) before a planned surgery.  The blood given in a transfusion is made up of different blood components. You may receive red blood cells, platelets, plasma, or white blood cells depending on the condition treated.  Your temperature, blood pressure, and pulse will be monitored before, during, and after the transfusion.  After the transfusion, your blood may be tested to see how your body has responded. This information is not intended to replace advice given to you by your health care provider. Make sure you discuss any questions you have with your health care provider. Document Revised: 08/03/2018 Document Reviewed: 08/03/2018 Elsevier Patient Education  [place].

## 2019-09-11 NOTE — Progress Notes (Signed)
Joy Reyes  [phone] Fax:([phone]) [phone]     ID: Joy Reyes DOB: 01/06/1986  MR#: [id number]  TDH#:[id number]  Patient Care Team: System, Pcp Not In as PCP - General [name], [name], MD as Consulting Physician (Oncology) Fulton Reek, MD as Referring Physician (Obstetrics and Gynecology) [name], RN as Oncology Nurse Navigator [name], RN as Oncology Nurse Navigator [name], MD as Consulting Physician (Plastic Surgery) Scot Dock, NP OTHER MD: PCP= [name] MD  CHIEF COMPLAINT: BRCA positive breast cancer  CURRENT TREATMENT: Neoadjuvant chemotherapy   INTERVAL HISTORY: Joy Reyes" returns today for follow up and treatment of her BRCA positive breast cancer, accompanied by her husband Joy Reyes.  She started neoadjuvant chemotherapy consisting of carboplatin day 1, Abraxane days 1,8,15, repeated Q21 days x 4 cycles, on 07/10/2019. Today is day 15 cycle 3.    REVIEW OF SYSTEMS: Joy Reyes is doing quite well today.  She has no current issues, other than some mild fatigue.  She notes her tumor has gotten quite smaller.  She is not experiencing any peripheral neuropathy.  She is without fever, chills, chest pain, palpitations, cough, bowel/bladder changes, headaches, nausea, vomiting, mucositis or any other concerns.  A detailed ROS was otherwise non contributory.    HISTORY OF CURRENT ILLNESS: From the original intake note:  Joy Reyes has a history of breast cancer, diagnosed in 2012 while she lived in [place], [place], and treated in [place] under Dr [name].  We are obtaining data from that experience but according to the patient her tumor was the size of an apricot.  She did not have axillary lymph node sampling or radiation.  She remembers 8 neoadjuvant chemotherapy treatments given every 2 weeks with the first including the "red drug", which suggest Cytoxan and Adriamycin in dose dense fashion x4 most likely  followed by either Taxol or Taxotere in dose dense fashion x4.  After that experience and given her breast density and BRCA mutation our practice here would have been a yearly follow-up with MRI in addition to yearly mammography.   More recently Joy Reyes herself palpated a change in her right breast.  She brought it to Dr. [name] attention and underwent bilateral diagnostic mammography with tomography and left breast ultrasonography at [facility] on 06/14/2019 showing: breast density category D; there was an area of asymmetry in the upper outer quadrant of the left breast, and ultrasound of that area showed a 2.8 cm mass.  Ultrasound of the axilla showed 1.8 cm lymph node with cortical thickening.    In the right breast they saw only postsurgical changes, with 2 biopsy clips in the retroareolar area.  Accordingly on 06/19/2019 she proceeded to biopsy of the left breast area in question. 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  The left axillary lymph node biopsy was nondiagnostic, with very spare sampling of fibroadipose and lymphoid tissue.  Estrogen receptor was negative, with less than 1% uptake, progesterone receptor was negative, with less than 1% uptake, HER-2 was negative by immunohistochemistry with a score of 1+.  The patient's subsequent history is as detailed below.   PAST MEDICAL HISTORY: Past Medical History:  Diagnosis Date  . Cancer [facility])    breast cancer 2012    PAST SURGICAL HISTORY: Past Surgical History:  Procedure Laterality Date  . BREAST SURGERY     lumpectomy on right breast  . CESAREAN SECTION MULTI-GESTATIONAL N/A  06/01/2015   Procedure: CESAREAN SECTION MULTI-GESTATIONAL;  Surgeon: Waymon Amato, MD;  Location: [facility];  Service: Obstetrics;  Laterality: N/A;  . IR IMAGING GUIDED PORT INSERTION  07/05/2019  . LOBECTOMY Right 2012  . SALPINGECTOMY      FAMILY HISTORY: Family  History  Problem Relation Age of Onset  . Breast cancer Maternal Grandmother        very late in life, lived to 3  . Breast cancer Paternal Grandmother        died at age 64  . Alzheimer's disease Paternal Grandfather     The patient's parents are both turning 28 in 2021.  The paternal grandfather died from Alzheimer's disease.  The paternal grandmother died from breast cancer at the age of 29.  The patient's father had 1 sister who had died as a child from meningitis.  He had no brothers.  On the mother side, a maternal grandmother did have breast cancer very late in life, living to be 62.  The paternal grandfather and the only maternal aunt had no history of cancer.  The patient has 1 brother, in good health, no sisters.   GYNECOLOGIC HISTORY:  No LMP recorded. Menarche: 34 years old Age at first live birth: 34 years old [place] P twins LMP regular as of May 2021 Contraceptive HRT no  Hysterectomy?  No BSO?  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  Her husband Joy Reyes") works for an NGO (the [employer]) out of [place] which helps small agencies doing research set up and succeed.  Their twins are 33 years old. Joy Reyes has (2?) children from a prior marriage. The patient belongs to an orthodox denomination    ADVANCED DIRECTIVES: In the absence of any documentation to the contrary, the patient's spouse is their HCPOA.    HEALTH MAINTENANCE: Social History   Tobacco Use  . Smoking status: Never Smoker  . Smokeless tobacco: Never Used  Substance Use Topics  . Alcohol use: No  . Drug use: No     Colonoscopy: n/a (age)  PAP: Up-to-date  Bone density: n/a (age)   Allergies  Allergen Reactions  . Paclitaxel     Anaphylactic per pt history in MD note    Current Outpatient Medications  Medication Sig Dispense Refill  . dexamethasone (DECADRON) 4 MG tablet Take 2 tablets  (8 mg total) by mouth daily. Start the day after carboplatin chemotherapy (day 1 of each 21 days cycle) and continue for 2 days (days 2 and 3, counting chemotherapy day as day 1);  last dose the morning of day 4. 30 tablet 1  . filgrastim-aafi (NIVESTYM) 480 MCG/0.8ML SOSY injection Pt to self administered 480 mcg SQ on days 3,4 and 5 of weekly chemotherapy x 9 weeks 21.6 mL 0  . lidocaine-prilocaine (EMLA) cream Apply to affected area once 30 g 3  . LORazepam (ATIVAN) 0.5 MG tablet Take 1 tablet (0.5 mg total) by mouth at bedtime as needed (Nausea or vomiting). 20 tablet 0  . prochlorperazine (COMPAZINE) 10 MG tablet Take 1 tablet (10 mg total) by mouth every 6 (six) hours as needed (Nausea or vomiting). 30 tablet 1   No current facility-administered medications for this visit.    OBJECTIVE: White woman who appears stated age  34:   09/11/19 1148  BP: 110/66  Pulse: 78  Resp: 18  Temp: 98.8 F (37.1 C)  SpO2: 100%  Body mass index is 21.85 kg/m.   Wt Readings from Last 3 Encounters:  09/11/19 143 lb 11.2 oz (65.2 kg)  09/04/19 141 lb 11.2 oz (64.3 kg)  08/28/19 142 lb (64.4 kg)      ECOG FS:1 - Symptomatic but completely ambulatory GENERAL: Patient is a well appearing female in no acute distress HEENT:  Sclerae anicteric.  Mask in place.  Neck is supple.  NODES:  No cervical, supraclavicular, or axillary lymphadenopathy palpated.  BREAST EXAM:  Deferred. LUNGS:  Clear to auscultation bilaterally.  No wheezes or rhonchi. HEART:  Regular rate and rhythm. No murmur appreciated. ABDOMEN:  Soft, nontender.  Positive, normoactive bowel sounds. No organomegaly palpated. MSK:  No focal spinal tenderness to palpation. Full range of motion bilaterally in the upper extremities. EXTREMITIES:  No peripheral edema.   SKIN:  Clear with no obvious rashes or skin changes. No nail dyscrasia. NEURO:  Nonfocal. Well oriented.  Appropriate affect.     LAB RESULTS:      Component  Value Date/Time   NA 141 09/11/2019 1111   K 4.4 09/11/2019 1111   CL 109 09/11/2019 1111   CO2 22 09/11/2019 1111   GLUCOSE 102 (H) 09/11/2019 1111   BUN 18 09/11/2019 1111   CREATININE 0.73 09/11/2019 1111   CALCIUM 10.4 (H) 09/11/2019 1111   PROT 7.1 09/11/2019 1111   ALBUMIN 4.3 09/11/2019 1111   AST 47 (H) 09/11/2019 1111   ALT 74 (H) 09/11/2019 1111   ALKPHOS 65 09/11/2019 1111   BILITOT <0.2 (L) 09/11/2019 1111   GFRNONAA >60 09/11/2019 1111   GFRAA >60 09/11/2019 1111    No results found for: TOTALPROTELP, ALBUMINELP, A1GS, A2GS, BETS, BETA2SER, GAMS, MSPIKE, SPEI  Lab Results  Component Value Date   WBC 3.8 (L) 09/11/2019   NEUTROABS 1.5 (L) 09/11/2019   HGB 7.9 (L) 09/11/2019   HCT 23.6 (L) 09/11/2019   MCV 96.3 09/11/2019   PLT 116 (L) 09/11/2019    No results found for: LABCA2  No components found for: PPIRJJ884  No results for input(s): INR in the last 168 hours.  No results found for: LABCA2  No results found for: ZYS063  No results found for: KZS010  No results found for: XNA355  No results found for: CA2729  No components found for: HGQUANT  No results found for: CEA1 / No results found for: CEA1   No results found for: AFPTUMOR  No results found for: CHROMOGRNA  No results found for: KPAFRELGTCHN, LAMBDASER, KAPLAMBRATIO (kappa/lambda light chains)  No results found for: HGBA, HGBA2QUANT, HGBFQUANT, HGBSQUAN (Hemoglobinopathy evaluation)   No results found for: LDH  No results found for: IRON, TIBC, IRONPCTSAT (Iron and TIBC)  No results found for: FERRITIN  Urinalysis No results found for: COLORURINE, APPEARANCEUR, LABSPEC, PHURINE, GLUCOSEU, HGBUR, BILIRUBINUR, KETONESUR, PROTEINUR, UROBILINOGEN, NITRITE, LEUKOCYTESUR   STUDIES: No results found.  ELIGIBLE FOR AVAILABLE RESEARCH PROTOCOL: [id number]?  ASSESSMENT: 34 y.o. BRCA positive [facility] woman  (0) BRCA positivity:  (a) s/p bilateral salpingectomy  (b) pelvic  ultrasound including transvaginal study 06/01/2019 unremarkable, as was CA 125  (c) planning on bilateral mastectomies  (d) bilateral salpingectomy 11/28/2013, age 34 (no oophorectomy)  RIGHT BREAST CANCER (1) Status post right lumpectomy 2012 for a [        ]  (a) received neoadjuvant chemotherapy consisting of 4 cycles of doxorubicin and cyclophosphamide, followed by paclitaxel x1 (developed anaphylaxis with the first dose), then 4 doses of docetaxel completed August 2012  (  b) [no adjuvant radiation]  LEFT BREAST CANCER (2) Status post left breast upper outer quadrant biopsy 06/19/2019 for a clinical T2 N1, stage IIIB invasive ductal carcinoma, grade 3, triple negative  (a) left axillary lymph node biopsy 07/12/2019 nondiagnostic [discordant]  (b) chest abdomen and pelvis CT scan 07/05/2019 shows small left axillary lymph nodes, 1.5 cm enhancement in the left breast, no internal mammary adenopathy, no evidence of metastatic disease  (c) bone scan 07/13/2019 shows no evidence of bony metastatic disease  (d) biopsy of RIGHT breast mass 07/18/2019  (3) neoadjuvant chemotherapy consisting of carboplatin day 1, Abraxane days 1,8,15, repeated Q21 days x 4 cycles, starting 07/10/2019  (4) definitive surgery to follow  (5) adjuvant radiation as appropriate  (6) consider adjuvant pembrolizumab or PARP inhibitor   PLAN: Joy Reyes continues on treatment with weekly Abraxane and every 3 week Carboplatin.  She continues to tolerate this qiute well and has no peripheral neuropathy.  She is due for Abraxane alone today.  She is having cytopenias, with an ANC of 1.5, plt count of 116, and hemoglobin of 7.9.  I discussed with her the option to have a blood transfusion with her next cycle of chemotherapy, and she really wants to avoid this at all possible.    After discussion, and since she is minimally symptomatic, she will receive Abraxane today with her hemoglobin of 7.9.  I have given her information on  anemia, and when to call us, or get help right away.  I also gave her information on blood transfusions.  We will see her back in one week for labs and f/u.    She knows to call for any questions that may arise between now and her next appointment.  We are happy to see her sooner if needed.   Total encounter time: 20 minutes*  Wilber Bihari, NP 09/11/19 12:02 PM Medical Oncology and Hematology [facility] [place], [place] [address] Tel. [phone]    Fax. [phone]   *Total Encounter Time as defined by the Centers for Medicare and Medicaid Services includes, in addition to the face-to-face time of a patient visit (documented in the note above) non-face-to-face time: obtaining and reviewing outside history, ordering and reviewing medications, tests or procedures, care coordination (communications with other health care professionals or caregivers) and documentation in the medical record.

## 2019-09-11 NOTE — Patient Instructions (Signed)
[facility] Discharge Instructions for Patients Receiving Chemotherapy  Today you received the following chemotherapy agents: abraxane.  To help prevent nausea and vomiting after your treatment, we encourage you to take your nausea medication as directed.   If you develop nausea and vomiting that is not controlled by your nausea medication, call the clinic.   BELOW ARE SYMPTOMS THAT SHOULD BE REPORTED IMMEDIATELY:  *FEVER GREATER THAN 100.5 F  *CHILLS WITH OR WITHOUT FEVER  NAUSEA AND VOMITING THAT IS NOT CONTROLLED WITH YOUR NAUSEA MEDICATION  *UNUSUAL SHORTNESS OF BREATH  *UNUSUAL BRUISING OR BLEEDING  TENDERNESS IN MOUTH AND THROAT WITH OR WITHOUT PRESENCE OF ULCERS  *URINARY PROBLEMS  *BOWEL PROBLEMS  UNUSUAL RASH Items with * indicate a potential emergency and should be followed up as soon as possible.  Feel free to call the clinic should you have any questions or concerns. The clinic phone number is [phone].  Please show the [facility] at check-in to the Emergency Department and triage nurse.

## 2019-09-12 ENCOUNTER — Telehealth: Payer: Self-pay | Admitting: Adult Health

## 2019-09-12 NOTE — Telephone Encounter (Signed)
No 7/20 los. No changes made to pt's schedule.  

## 2019-09-16 NOTE — Progress Notes (Signed)
Joy Reyes  [phone] Fax:([phone]) [phone]     ID: Joy Reyes DOB: 05-23-1985  MR#: [id number]  DTO#:[id number]  Patient Care Team: System, Pcp Not In as PCP - General [name], [name], MD as Consulting Physician (Oncology) Fulton Reek, MD as Referring Physician (Obstetrics and Gynecology) [name], RN as Oncology Nurse Navigator [name], RN as Oncology Nurse Navigator [name], MD as Consulting Physician (Plastic Surgery) Scot Dock, NP OTHER MD: PCP= [name] MD  CHIEF COMPLAINT: BRCA positive breast cancer  CURRENT TREATMENT: Neoadjuvant chemotherapy   INTERVAL HISTORY: Joy Reyes" returns today for follow up and treatment of her BRCA positive breast cancer, accompanied by her husband Joy Reyes.  She started neoadjuvant chemotherapy consisting of carboplatin day 1, Abraxane days 1,8,15, repeated Q21 days x 4 cycles, on 07/10/2019. Today is day 1 cycle 4.    REVIEW OF SYSTEMS: Joy Reyes is doing well today.  She is mildly fatigued, but overall continues to tolerate her chemotherapy quite well.  She continues to inject Nuvestym (Neupogen biosimilar) at home with good tolerance.  She is anemic, however denies any dizziness, or notable DOE.  She is without peripheral neuropathy, fever, chills, chest pain, palpitations, cough, shortness of breath, bowel/bladder changes, headaches, or any other concerns.    HISTORY OF CURRENT ILLNESS: From the original intake note:  Joy Reyes has a history of breast cancer, diagnosed in 2012 while she lived in [place], [place], and treated in [place] under Dr [name].  We are obtaining data from that experience but according to the patient her tumor was the size of an apricot.  She did not have axillary lymph node sampling or radiation.  She remembers 8 neoadjuvant chemotherapy treatments given every 2 weeks with the first including the "red drug", which suggest Cytoxan and  Adriamycin in dose dense fashion x4 most likely followed by either Taxol or Taxotere in dose dense fashion x4.  After that experience and given her breast density and BRCA mutation our practice here would have been a yearly follow-up with MRI in addition to yearly mammography.   More recently Joy Reyes herself palpated a change in her right breast.  She brought it to Dr. [name] attention and underwent bilateral diagnostic mammography with tomography and left breast ultrasonography at [facility] on 06/14/2019 showing: breast density category D; there was an area of asymmetry in the upper outer quadrant of the left breast, and ultrasound of that area showed a 2.8 cm mass.  Ultrasound of the axilla showed 1.8 cm lymph node with cortical thickening.    In the right breast they saw only postsurgical changes, with 2 biopsy clips in the retroareolar area.  Accordingly on 06/19/2019 she proceeded to biopsy of the left breast area in question. 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  The left axillary lymph node biopsy was nondiagnostic, with very spare sampling of fibroadipose and lymphoid tissue.  Estrogen receptor was negative, with less than 1% uptake, progesterone receptor was negative, with less than 1% uptake, HER-2 was negative by immunohistochemistry with a score of 1+.  The patient's subsequent history is as detailed below.   PAST MEDICAL HISTORY: Past Medical History:  Diagnosis Date  . Cancer [facility])    breast cancer 2012    PAST SURGICAL HISTORY: Past Surgical History:  Procedure Laterality Date  . BREAST SURGERY     lumpectomy on right breast  . CESAREAN  SECTION MULTI-GESTATIONAL N/A 06/01/2015   Procedure: CESAREAN SECTION MULTI-GESTATIONAL;  Surgeon: Waymon Amato, MD;  Location: [facility];  Service: Obstetrics;  Laterality: N/A;  . IR IMAGING GUIDED PORT INSERTION  07/05/2019  . LOBECTOMY Right 2012  .  SALPINGECTOMY      FAMILY HISTORY: Family History  Problem Relation Age of Onset  . Breast cancer Maternal Grandmother        very late in life, lived to 103  . Breast cancer Paternal Grandmother        died at age 58  . Alzheimer's disease Paternal Grandfather     The patient's parents are both turning 36 in 2021.  The paternal grandfather died from Alzheimer's disease.  The paternal grandmother died from breast cancer at the age of 53.  The patient's father had 1 sister who had died as a child from meningitis.  He had no brothers.  On the mother side, a maternal grandmother did have breast cancer very late in life, living to be 42.  The paternal grandfather and the only maternal aunt had no history of cancer.  The patient has 1 brother, in good health, no sisters.   GYNECOLOGIC HISTORY:  No LMP recorded. Menarche: 34 years old Age at first live birth: 34 years old [place] P twins LMP regular as of May 2021 Contraceptive HRT no  Hysterectomy?  No BSO?  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  Her husband Joy Reyes") works for an NGO (the [employer]) out of [place] which helps small agencies doing research set up and succeed.  Their twins are 34 years old. Joy Reyes has (2?) children from a prior marriage. The patient belongs to an orthodox denomination    ADVANCED DIRECTIVES: In the absence of any documentation to the contrary, the patient's spouse is their HCPOA.    HEALTH MAINTENANCE: Social History   Tobacco Use  . Smoking status: Never Smoker  . Smokeless tobacco: Never Used  Substance Use Topics  . Alcohol use: No  . Drug use: No     Colonoscopy: n/a (age)  PAP: Up-to-date  Bone density: n/a (age)   Allergies  Allergen Reactions  . Paclitaxel     Anaphylactic per pt history in MD note    Current Outpatient Medications  Medication Sig Dispense Refill  .  dexamethasone (DECADRON) 4 MG tablet Take 2 tablets (8 mg total) by mouth daily. Start the day after carboplatin chemotherapy (day 1 of each 21 days cycle) and continue for 2 days (days 2 and 3, counting chemotherapy day as day 1);  last dose the morning of day 4. 30 tablet 1  . filgrastim-aafi (NIVESTYM) 480 MCG/0.8ML SOSY injection Pt to self administered 480 mcg SQ on days 3,4 and 5 of weekly chemotherapy x 9 weeks 21.6 mL 0  . lidocaine-prilocaine (EMLA) cream Apply to affected area once 30 g 3  . LORazepam (ATIVAN) 0.5 MG tablet Take 1 tablet (0.5 mg total) by mouth at bedtime as needed (Nausea or vomiting). 20 tablet 0  . prochlorperazine (COMPAZINE) 10 MG tablet Take 1 tablet (10 mg total) by mouth every 6 (six) hours as needed (Nausea or vomiting). 30 tablet 1   No current facility-administered medications for this visit.    OBJECTIVE: White woman who appears stated age  Vitals:   09/18/19 0921  BP: 119/70  Pulse: 69  Resp: 18  Temp: 98.2 F (36.8 C)  SpO2: 100%     Body mass index is 22.15 kg/m.   Wt Readings from Last 3 Encounters:  09/18/19 145 lb 11.2 oz (66.1 kg)  09/11/19 143 lb 11.2 oz (65.2 kg)  09/04/19 141 lb 11.2 oz (64.3 kg)      ECOG FS:1 - Symptomatic but completely ambulatory GENERAL: Patient is a well appearing female in no acute distress HEENT:  Sclerae anicteric.  Mask in place.  Neck is supple.  NODES:  No cervical, supraclavicular, or axillary lymphadenopathy palpated.  BREAST EXAM:  Deferred. LUNGS:  Clear to auscultation bilaterally.  No wheezes or rhonchi. HEART:  Regular rate and rhythm. No murmur appreciated. ABDOMEN:  Soft, nontender.  Positive, normoactive bowel sounds. No organomegaly palpated. MSK:  No focal spinal tenderness to palpation. Full range of motion bilaterally in the upper extremities. EXTREMITIES:  No peripheral edema.   SKIN:  Clear with no obvious rashes or skin changes. No nail dyscrasia. NEURO:  Nonfocal. Well oriented.   Appropriate affect.     LAB RESULTS:      Component Value Date/Time   NA 141 09/11/2019 1111   K 4.4 09/11/2019 1111   CL 109 09/11/2019 1111   CO2 22 09/11/2019 1111   GLUCOSE 102 (H) 09/11/2019 1111   BUN 18 09/11/2019 1111   CREATININE 0.73 09/11/2019 1111   CALCIUM 10.4 (H) 09/11/2019 1111   PROT 7.1 09/11/2019 1111   ALBUMIN 4.3 09/11/2019 1111   AST 47 (H) 09/11/2019 1111   ALT 74 (H) 09/11/2019 1111   ALKPHOS 65 09/11/2019 1111   BILITOT <0.2 (L) 09/11/2019 1111   GFRNONAA >60 09/11/2019 1111   GFRAA >60 09/11/2019 1111    No results found for: TOTALPROTELP, ALBUMINELP, A1GS, A2GS, BETS, BETA2SER, GAMS, MSPIKE, SPEI  Lab Results  Component Value Date   WBC 4.7 09/18/2019   NEUTROABS 2.8 09/18/2019   HGB 7.5 (L) 09/18/2019   HCT 22.4 (L) 09/18/2019   MCV 98.2 09/18/2019   PLT 102 (L) 09/18/2019    No results found for: LABCA2  No components found for: [id number]  No results for input(s): INR in the last 168 hours.  No results found for: LABCA2  No results found for: FGH829  No results found for: HBZ169  No results found for: CVE938  No results found for: CA2729  No components found for: HGQUANT  No results found for: CEA1 / No results found for: CEA1   No results found for: AFPTUMOR  No results found for: CHROMOGRNA  No results found for: KPAFRELGTCHN, LAMBDASER, KAPLAMBRATIO (kappa/lambda light chains)  No results found for: HGBA, HGBA2QUANT, HGBFQUANT, HGBSQUAN (Hemoglobinopathy evaluation)   No results found for: LDH  No results found for: IRON, TIBC, IRONPCTSAT (Iron and TIBC)  No results found for: FERRITIN  Urinalysis No results found for: COLORURINE, APPEARANCEUR, LABSPEC, PHURINE, GLUCOSEU, HGBUR, BILIRUBINUR, KETONESUR, PROTEINUR, UROBILINOGEN, NITRITE, LEUKOCYTESUR   STUDIES: No results found.  ELIGIBLE FOR AVAILABLE RESEARCH PROTOCOL: [phone]?  ASSESSMENT: 34 y.o. BRCA positive [facility] woman  (0) BRCA  positivity:  (a) s/p bilateral salpingectomy  (b) pelvic ultrasound including transvaginal study 06/01/2019 unremarkable, as was CA 125  (c) planning on bilateral mastectomies  (d) bilateral salpingectomy 11/28/2013, age 77 (no oophorectomy)  RIGHT BREAST CANCER (1) Status post right lumpectomy 2012 for a [        ]  (a) received neoadjuvant chemotherapy consisting of 4 cycles of doxorubicin and cyclophosphamide, followed by paclitaxel x1 (developed anaphylaxis with the first dose), then 4 doses  of docetaxel completed August 2012  (b) [no adjuvant radiation]  LEFT BREAST CANCER (2) Status post left breast upper outer quadrant biopsy 06/19/2019 for a clinical T2 N1, stage IIIB invasive ductal carcinoma, grade 3, triple negative  (a) left axillary lymph node biopsy 07/12/2019 nondiagnostic [discordant]  (b) chest abdomen and pelvis CT scan 07/05/2019 shows small left axillary lymph nodes, 1.5 cm enhancement in the left breast, no internal mammary adenopathy, no evidence of metastatic disease  (c) bone scan 07/13/2019 shows no evidence of bony metastatic disease  (d) biopsy of RIGHT breast mass 07/18/2019  (3) neoadjuvant chemotherapy consisting of carboplatin day 1, Abraxane days 1,8,15, repeated Q21 days x 4 cycles, starting 07/10/2019  (4) definitive surgery to follow  (5) adjuvant radiation as appropriate  (6) consider adjuvant pembrolizumab or PARP inhibitor   PLAN: Joy Reyes is doing moderately well today.  She has continued on neoadjuvant chemotherapy with Carboplatin and Abraxane.  Today she is due for both Carboplatin and Abraxane.  Her hemoglobin is 7.5 today and her platelets are 102.  We could give her the chemotherapy today if she could receive a blood transfusion, however with her platelets borderline low, and her hemoglobin low, there is a chance she still may miss chemo next week due to thrombocytopenia.    I recommended that she receive one unit of blood if receiving prior to  the Abraxane and Carboplatin today.  However, Temika would prefer to wait one week and hopefully see an improvement in both the hemoglobin and platelets and avoid a transfusion.  This is understandable and reasonable.  Donyae will return next week for labs, f/u, and her next chemotherapy.  She knows to call for any questions that may arise between now and her next appointment.  We are happy to see her sooner if needed.   Total encounter time: 30 minutes*  Wilber Bihari, NP 09/18/19 9:39 AM Medical Oncology and Hematology [facility] [place], [place] [address] Tel. [phone]    Fax. [phone]   *Total Encounter Time as defined by the Centers for Medicare and Medicaid Services includes, in addition to the face-to-face time of a patient visit (documented in the note above) non-face-to-face time: obtaining and reviewing outside history, ordering and reviewing medications, tests or procedures, care coordination (communications with other health care professionals or caregivers) and documentation in the medical record.

## 2019-09-18 ENCOUNTER — Inpatient Hospital Stay: Payer: Commercial Managed Care - PPO

## 2019-09-18 ENCOUNTER — Ambulatory Visit: Payer: Commercial Managed Care - PPO

## 2019-09-18 ENCOUNTER — Encounter: Payer: Self-pay | Admitting: Adult Health

## 2019-09-18 ENCOUNTER — Telehealth: Payer: Self-pay | Admitting: Hematology and Oncology

## 2019-09-18 ENCOUNTER — Inpatient Hospital Stay (HOSPITAL_BASED_OUTPATIENT_CLINIC_OR_DEPARTMENT_OTHER): Payer: Commercial Managed Care - PPO | Admitting: Adult Health

## 2019-09-18 ENCOUNTER — Other Ambulatory Visit: Payer: Commercial Managed Care - PPO

## 2019-09-18 ENCOUNTER — Telehealth: Payer: Self-pay | Admitting: Oncology

## 2019-09-18 ENCOUNTER — Ambulatory Visit: Payer: Commercial Managed Care - PPO | Admitting: Adult Health

## 2019-09-18 ENCOUNTER — Other Ambulatory Visit: Payer: Self-pay

## 2019-09-18 ENCOUNTER — Encounter: Payer: Self-pay | Admitting: *Deleted

## 2019-09-18 VITALS — BP 119/70 | HR 69 | Temp 98.2°F | Resp 18 | Ht 68.0 in | Wt 145.7 lb

## 2019-09-18 DIAGNOSIS — Z5111 Encounter for antineoplastic chemotherapy: Secondary | ICD-10-CM | POA: Diagnosis not present

## 2019-09-18 DIAGNOSIS — Z95828 Presence of other vascular implants and grafts: Secondary | ICD-10-CM

## 2019-09-18 DIAGNOSIS — C50412 Malignant neoplasm of upper-outer quadrant of left female breast: Secondary | ICD-10-CM

## 2019-09-18 DIAGNOSIS — C50911 Malignant neoplasm of unspecified site of right female breast: Secondary | ICD-10-CM

## 2019-09-18 DIAGNOSIS — C50211 Malignant neoplasm of upper-inner quadrant of right female breast: Secondary | ICD-10-CM

## 2019-09-18 DIAGNOSIS — Z171 Estrogen receptor negative status [ER-]: Secondary | ICD-10-CM

## 2019-09-18 DIAGNOSIS — Z1501 Genetic susceptibility to malignant neoplasm of breast: Secondary | ICD-10-CM

## 2019-09-18 DIAGNOSIS — Z1509 Genetic susceptibility to other malignant neoplasm: Secondary | ICD-10-CM

## 2019-09-18 LAB — CBC WITH DIFFERENTIAL/PLATELET
Abs Immature Granulocytes: 0.2 10*3/uL — ABNORMAL HIGH (ref 0.00–0.07)
Basophils Absolute: 0 10*3/uL (ref 0.0–0.1)
Basophils Relative: 1 %
Eosinophils Absolute: 0 10*3/uL (ref 0.0–0.5)
Eosinophils Relative: 0 %
HCT: 22.4 % — ABNORMAL LOW (ref 36.0–46.0)
Hemoglobin: 7.5 g/dL — ABNORMAL LOW (ref 12.0–15.0)
Immature Granulocytes: 4 %
Lymphocytes Relative: 26 %
Lymphs Abs: 1.3 10*3/uL (ref 0.7–4.0)
MCH: 32.9 pg (ref 26.0–34.0)
MCHC: 33.5 g/dL (ref 30.0–36.0)
MCV: 98.2 fL (ref 80.0–100.0)
Monocytes Absolute: 0.5 10*3/uL (ref 0.1–1.0)
Monocytes Relative: 10 %
Neutro Abs: 2.8 10*3/uL (ref 1.7–7.7)
Neutrophils Relative %: 59 %
Platelets: 102 10*3/uL — ABNORMAL LOW (ref 150–400)
RBC: 2.28 MIL/uL — ABNORMAL LOW (ref 3.87–5.11)
RDW: 19.9 % — ABNORMAL HIGH (ref 11.5–15.5)
WBC: 4.7 10*3/uL (ref 4.0–10.5)
nRBC: 0.8 % — ABNORMAL HIGH (ref 0.0–0.2)

## 2019-09-18 LAB — COMPREHENSIVE METABOLIC PANEL
ALT: 38 U/L (ref 0–44)
AST: 26 U/L (ref 15–41)
Albumin: 4.3 g/dL (ref 3.5–5.0)
Alkaline Phosphatase: 70 U/L (ref 38–126)
Anion gap: 10 (ref 5–15)
BUN: 10 mg/dL (ref 6–20)
CO2: 21 mmol/L — ABNORMAL LOW (ref 22–32)
Calcium: 10.2 mg/dL (ref 8.9–10.3)
Chloride: 111 mmol/L (ref 98–111)
Creatinine, Ser: 0.74 mg/dL (ref 0.44–1.00)
GFR calc Af Amer: >60 mL/min (ref >60)
GFR calc non Af Amer: >60 mL/min (ref >60)
Glucose, Bld: 109 mg/dL — ABNORMAL HIGH (ref 70–99)
Potassium: 4.2 mmol/L (ref 3.5–5.1)
Sodium: 142 mmol/L (ref 135–145)
Total Bilirubin: 0.3 mg/dL (ref 0.3–1.2)
Total Protein: 6.9 g/dL (ref 6.5–8.1)

## 2019-09-18 LAB — SAMPLE TO BLOOD BANK

## 2019-09-18 MED ORDER — SODIUM CHLORIDE 0.9% FLUSH
10.0000 mL | Freq: Once | INTRAVENOUS | Status: AC
  Administered 2019-09-18: 10 mL
  Filled 2019-09-18: qty 10

## 2019-09-18 MED ORDER — HEPARIN SOD (PORK) LOCK FLUSH 100 UNIT/ML IV SOLN
500.0000 [IU] | Freq: Once | INTRAVENOUS | Status: AC
  Administered 2019-09-18: 500 [IU]
  Filled 2019-09-18: qty 5

## 2019-09-18 NOTE — Telephone Encounter (Signed)
Scheduled appts per 7/27 los. Pt to get updated appt calendar at next visit per appt notes.

## 2019-09-24 ENCOUNTER — Telehealth: Payer: Self-pay | Admitting: *Deleted

## 2019-09-24 NOTE — Progress Notes (Signed)
[place]  [phone] [phone]     ID: Joy Reyes DOB: 07/30/1985  MR#: [id number]  NID#:[id number]  Patient Care Team: System, Pcp Not In as PCP - General Roux Brandy, [name], MD as Consulting Physician (Oncology) Fulton Reek, MD as Referring Physician (Obstetrics and Gynecology) [name], RN as Oncology Nurse Navigator [name], RN as Oncology Nurse Navigator [name], MD as Consulting Physician (Plastic Surgery) [name], MD OTHER MD: PCP= [name] MD  CHIEF COMPLAINT: BRCA positive breast cancer  CURRENT TREATMENT: Neoadjuvant chemotherapy   INTERVAL HISTORY: Joy Reyes" returns today for follow up and treatment of her BRCA positive breast cancer, accompanied by her husband Joy Reyes.  She started neoadjuvant chemotherapy consisting of carboplatin day 1, Abraxane days 1,8,15, repeated Q21 days x 4 cycles, on 07/10/2019. Today is day 1 cycle 4.    She is scheduled for breast MRI on 10/03/2019 and for bilateral mastectomies and left axillary sentinel lymph node biopsy on 11/27/2019 under Dr. [name].   REVIEW OF SYSTEMS: Joy Reyes continues to do remarkably well with her treatment.  She has not missed any of her exercise routines.  She has really not lost much of her hair although she says she has not really washed it in 2 or 3 days.  She fasts for 2 days before treatment and she believes this "slows down the metabolism" of the hair follicles and that may account for her keeping her hair which is really an unusual phenomenon.  She has met with Dr. [name] and she as well as Dr. [name] suggested nipple sparing mastectomies, and we discussed that further today.  A detailed review of systems was otherwise stable   HISTORY OF CURRENT ILLNESS: From the original intake note:  Joy Reyes has a history of breast cancer, diagnosed in 2012 while she lived in [place], [place], and treated in [place] under Dr  [name].  We are obtaining data from that experience but according to the patient her tumor was the size of an apricot.  She did not have axillary lymph node sampling or radiation.  She remembers 8 neoadjuvant chemotherapy treatments given every 2 weeks with the first including the "red drug", which suggest Cytoxan and Adriamycin in dose dense fashion x4 most likely followed by either Taxol or Taxotere in dose dense fashion x4.  After that experience and given her breast density and BRCA mutation our practice here would have been a yearly follow-up with MRI in addition to yearly mammography.   More recently Joy Reyes herself palpated a change in her right breast.  She brought it to Dr. [name] attention and underwent bilateral diagnostic mammography with tomography and left breast ultrasonography at [facility] on 06/14/2019 showing: breast density category D; there was an area of asymmetry in the upper outer quadrant of the left breast, and ultrasound of that area showed a 2.8 cm mass.  Ultrasound of the axilla showed 1.8 cm lymph node with cortical thickening.    In the right breast they saw only postsurgical changes, with 2 biopsy clips in the retroareolar area.  Accordingly on 06/19/2019 she proceeded to biopsy of the left breast area in question. 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  The left axillary lymph node biopsy was nondiagnostic, with very spare sampling of fibroadipose and lymphoid tissue.  Estrogen receptor was negative, with less than 1% uptake, progesterone receptor was negative, with less  than 1% uptake, HER-2 was negative by immunohistochemistry with a score of 1+.  The patient's subsequent history is as detailed below.   PAST MEDICAL HISTORY: Past Medical History:  Diagnosis Date  . Cancer [facility])    breast cancer 2012    PAST SURGICAL HISTORY: Past Surgical History:  Procedure Laterality  Date  . BREAST SURGERY     lumpectomy on right breast  . CESAREAN SECTION MULTI-GESTATIONAL N/A 06/01/2015   Procedure: CESAREAN SECTION MULTI-GESTATIONAL;  Surgeon: Waymon Amato, MD;  Location: [facility];  Service: Obstetrics;  Laterality: N/A;  . IR IMAGING GUIDED PORT INSERTION  07/05/2019  . LOBECTOMY Right 2012  . SALPINGECTOMY      FAMILY HISTORY: Family History  Problem Relation Age of Onset  . Breast cancer Maternal Grandmother        very late in life, lived to 8  . Breast cancer Paternal Grandmother        died at age 63  . Alzheimer's disease Paternal Grandfather     The patient's parents are both turning 49 in 2021.  The paternal grandfather died from Alzheimer's disease.  The paternal grandmother died from breast cancer at the age of 36.  The patient's father had 1 sister who had died as a child from meningitis.  He had no brothers.  On the mother side, a maternal grandmother did have breast cancer very late in life, living to be 6.  The paternal grandfather and the only maternal aunt had no history of cancer.  The patient has 1 brother, in good health, no sisters.   GYNECOLOGIC HISTORY:  No LMP recorded. Menarche: 34 years old Age at first live birth: 33 years old [place] P twins LMP regular as of May 2021 Contraceptive HRT no  Hysterectomy?  No BSO?  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  Her husband Joy Reyes") works for an NGO (the [employer]) out of [place] which helps small agencies doing research set up and succeed.  Their twins are 33 years old. Joy Reyes has (2?) children from a prior marriage. The patient belongs to an orthodox denomination    ADVANCED DIRECTIVES: In the absence of any documentation to the contrary, the patient's spouse is their HCPOA.    HEALTH MAINTENANCE: Social History   Tobacco Use  . Smoking status: Never Smoker  . Smokeless tobacco:  Never Used  Substance Use Topics  . Alcohol use: No  . Drug use: No     Colonoscopy: n/a (age)  PAP: Up-to-date  Bone density: n/a (age)   Allergies  Allergen Reactions  . Paclitaxel     Anaphylactic per pt history in MD note    Current Outpatient Medications  Medication Sig Dispense Refill  . dexamethasone (DECADRON) 4 MG tablet Take 2 tablets (8 mg total) by mouth daily. Start the day after carboplatin chemotherapy (day 1 of each 21 days cycle) and continue for 2 days (days 2 and 3, counting chemotherapy day as day 1);  last dose the morning of day 4. 30 tablet 1  . filgrastim-aafi (NIVESTYM) 480 MCG/0.8ML SOSY injection Pt to self administered 480 mcg SQ on days 3,4 and 5 of weekly chemotherapy x 9 weeks 21.6 mL 0  . lidocaine-prilocaine (EMLA) cream Apply to affected area once 30 g 3  . LORazepam (ATIVAN) 0.5 MG tablet Take 1 tablet (0.5 mg total) by mouth at bedtime as needed (Nausea or  vomiting). 20 tablet 0  . prochlorperazine (COMPAZINE) 10 MG tablet Take 1 tablet (10 mg total) by mouth every 6 (six) hours as needed (Nausea or vomiting). 30 tablet 1   No current facility-administered medications for this visit.    OBJECTIVE: White woman in no acute distress  Vitals:   09/25/19 1008 09/25/19 1308  BP: 132/75 132/75  Pulse: 73 73  Resp: 20 20  Temp: (!) 97.1 F (36.2 C) (!) 97.1 F (36.2 C)  SpO2: 100% 100%     Body mass index is 21.26 kg/m.   Wt Readings from Last 3 Encounters:  09/25/19 139 lb 12.8 oz (63.4 kg)  09/18/19 145 lb 11.2 oz (66.1 kg)  09/11/19 143 lb 11.2 oz (65.2 kg)      ECOG FS:1 - Symptomatic but completely ambulatory  Sclerae unicteric, EOMs intact Wearing a mask No cervical or supraclavicular adenopathy Lungs no rales or rhonchi Heart regular rate and rhythm Abd soft, nontender, positive bowel sounds MSK no focal spinal tenderness, no upper extremity lymphedema Neuro: nonfocal, well oriented, appropriate affect Breasts: No palpable  mass in either breast, no skin or nipple changes of concern, both axillae are benign.   LAB RESULTS:      Component Value Date/Time   NA 138 09/25/2019 1218   K 4.4 09/25/2019 1218   CL 110 09/25/2019 1218   CO2 19 (L) 09/25/2019 1218   GLUCOSE 96 09/25/2019 1218   BUN 8 09/25/2019 1218   CREATININE 0.75 09/25/2019 1218   CALCIUM 9.9 09/25/2019 1218   PROT 7.1 09/25/2019 1218   ALBUMIN 4.4 09/25/2019 1218   AST 28 09/25/2019 1218   ALT 28 09/25/2019 1218   ALKPHOS 85 09/25/2019 1218   BILITOT 0.4 09/25/2019 1218   GFRNONAA >60 09/25/2019 1218   GFRAA >60 09/25/2019 1218    No results found for: TOTALPROTELP, ALBUMINELP, A1GS, A2GS, BETS, BETA2SER, GAMS, MSPIKE, SPEI  Lab Results  Component Value Date   WBC 5.0 09/25/2019   NEUTROABS 3.7 09/25/2019   HGB 9.0 (L) 09/25/2019   HCT 27.4 (L) 09/25/2019   MCV 103.8 (H) 09/25/2019   PLT 219 09/25/2019    No results found for: LABCA2  No components found for: PIRJJO841  No results for input(s): INR in the last 168 hours.  No results found for: LABCA2  No results found for: YSA630  No results found for: ZSW109  No results found for: NAT557  No results found for: CA2729  No components found for: HGQUANT  No results found for: CEA1 / No results found for: CEA1   No results found for: AFPTUMOR  No results found for: CHROMOGRNA  No results found for: KPAFRELGTCHN, LAMBDASER, KAPLAMBRATIO (kappa/lambda light chains)  No results found for: HGBA, HGBA2QUANT, HGBFQUANT, HGBSQUAN (Hemoglobinopathy evaluation)   No results found for: LDH  No results found for: IRON, TIBC, IRONPCTSAT (Iron and TIBC)  No results found for: FERRITIN  Urinalysis No results found for: COLORURINE, APPEARANCEUR, LABSPEC, PHURINE, GLUCOSEU, HGBUR, BILIRUBINUR, KETONESUR, PROTEINUR, UROBILINOGEN, NITRITE, LEUKOCYTESUR   STUDIES: No results found.  ELIGIBLE FOR AVAILABLE RESEARCH PROTOCOL: [phone]?  ASSESSMENT: 34 y.o. BRCA  positive [facility] [place] woman  (0) BRCA positivity:  (a) s/p bilateral salpingectomy  (b) pelvic ultrasound including transvaginal study 06/01/2019 unremarkable, as was CA 125  (c) planning on bilateral mastectomies  (d) bilateral salpingectomy 11/28/2013, age 60 (no oophorectomy)  RIGHT BREAST CANCER (1) Status post right lumpectomy 2012 for a [        ]  (  a) received neoadjuvant chemotherapy consisting of 4 cycles of doxorubicin and cyclophosphamide, followed by paclitaxel x1 (developed anaphylaxis with the first dose), then 4 doses of docetaxel completed August 2012  (b) [no adjuvant radiation]  LEFT BREAST CANCER (2) Status post left breast upper outer quadrant biopsy 06/19/2019 for a clinical T2 N1, stage IIIB invasive ductal carcinoma, grade 3, triple negative  (a) left axillary lymph node biopsy 07/12/2019 nondiagnostic [discordant]  (b) chest abdomen and pelvis CT scan 07/05/2019 shows small left axillary lymph nodes, 1.5 cm enhancement in the left breast, no internal mammary adenopathy, no evidence of metastatic disease  (c) bone scan 07/13/2019 shows no evidence of bony metastatic disease  (d) biopsy of RIGHT breast mass 07/18/2019  (3) neoadjuvant chemotherapy consisting of carboplatin day 1, Abraxane days 1,8,15, repeated Q21 days x 4 cycles, starting 07/10/2019  (4) definitive surgery to follow: Consider nipple sparing mastectomies  (5) adjuvant radiation as appropriate  (6) consider adjuvant OLAPARIB   PLAN: Joy Reyes has tolerated chemotherapy remarkably well and she is the only patient I have had so far who has not lost her hair with this type of chemotherapy.  She tells me that she fasts for 2 days before chemo and that that may be the explanation.  At any rate she is proceeding to the day 1 cycle 4 today.  This is the "heavy" today and I have encouraged her to go ahead and take her dexamethasone to prevent nausea with this final cycle.  She will return in the next 2 weeks  for the final 2 doses and we will again check her for peripheral neuropathy before proceeding.  She will then return to see me mid September.  Her counts should have recovered by then and she is scheduled preliminarily for definitive surgery with reconstruction on November 27, 2019.  She may have the port removed at the same time  She had questions regarding nipple sparing mastectomy and I gave her some information regarding that.  My recommendation is that she consider it strongly since there is really no difference in local recurrence or systemic recurrence between that and simple mastectomy.  There will always be about a 3% risk of recurrence regardless of which local treatment she receives  We also discussed olaparib and she understands the mechanism of action of P ARP inhibitors.  I gave her the basic information and we will discuss that further when she returns to see me in September  She knows to call for any other issue that may develop before the next visit  Total encounter time 35 minutes.Sarajane Jews C. Swayzee Wadley, MD 09/25/19 1:21 PM Medical Oncology and Hematology [facility] [place], [place] [address] Tel. [phone]    Fax. [phone]   I, Wilburn Mylar, am acting as scribe for Dr. [name]. Joy Reyes.  I, Lurline Del MD, have reviewed the above documentation for accuracy and completeness, and I agree with the above.    *Total Encounter Time as defined by the Centers for Medicare and Medicaid Services includes, in addition to the face-to-face time of a patient visit (documented in the note above) non-face-to-face time: obtaining and reviewing outside history, ordering and reviewing medications, tests or procedures, care coordination (communications with other health care professionals or caregivers) and documentation in the medical record.

## 2019-09-24 NOTE — Telephone Encounter (Signed)
Called pt to informed infusion added to appts for 09/25/19.  Discussed extension of FMLA. Informed pt to request any paperwork from HR department.

## 2019-09-25 ENCOUNTER — Inpatient Hospital Stay: Payer: Commercial Managed Care - PPO

## 2019-09-25 ENCOUNTER — Ambulatory Visit: Payer: Commercial Managed Care - PPO

## 2019-09-25 ENCOUNTER — Other Ambulatory Visit: Payer: Self-pay

## 2019-09-25 ENCOUNTER — Encounter: Payer: Self-pay | Admitting: *Deleted

## 2019-09-25 ENCOUNTER — Inpatient Hospital Stay: Payer: Commercial Managed Care - PPO | Attending: Oncology | Admitting: Oncology

## 2019-09-25 VITALS — BP 132/75 | HR 73 | Temp 97.1°F | Resp 20 | Ht 68.0 in | Wt 139.8 lb

## 2019-09-25 DIAGNOSIS — Z818 Family history of other mental and behavioral disorders: Secondary | ICD-10-CM | POA: Insufficient documentation

## 2019-09-25 DIAGNOSIS — Z5111 Encounter for antineoplastic chemotherapy: Secondary | ICD-10-CM | POA: Diagnosis not present

## 2019-09-25 DIAGNOSIS — R5383 Other fatigue: Secondary | ICD-10-CM | POA: Insufficient documentation

## 2019-09-25 DIAGNOSIS — Z171 Estrogen receptor negative status [ER-]: Secondary | ICD-10-CM

## 2019-09-25 DIAGNOSIS — Z9013 Acquired absence of bilateral breasts and nipples: Secondary | ICD-10-CM | POA: Insufficient documentation

## 2019-09-25 DIAGNOSIS — Z95828 Presence of other vascular implants and grafts: Secondary | ICD-10-CM

## 2019-09-25 DIAGNOSIS — C50412 Malignant neoplasm of upper-outer quadrant of left female breast: Secondary | ICD-10-CM

## 2019-09-25 DIAGNOSIS — Z803 Family history of malignant neoplasm of breast: Secondary | ICD-10-CM | POA: Insufficient documentation

## 2019-09-25 DIAGNOSIS — G629 Polyneuropathy, unspecified: Secondary | ICD-10-CM | POA: Insufficient documentation

## 2019-09-25 DIAGNOSIS — Z1501 Genetic susceptibility to malignant neoplasm of breast: Secondary | ICD-10-CM

## 2019-09-25 DIAGNOSIS — Z7952 Long term (current) use of systemic steroids: Secondary | ICD-10-CM | POA: Diagnosis not present

## 2019-09-25 DIAGNOSIS — C50911 Malignant neoplasm of unspecified site of right female breast: Secondary | ICD-10-CM

## 2019-09-25 DIAGNOSIS — C50211 Malignant neoplasm of upper-inner quadrant of right female breast: Secondary | ICD-10-CM

## 2019-09-25 DIAGNOSIS — Z79899 Other long term (current) drug therapy: Secondary | ICD-10-CM | POA: Diagnosis not present

## 2019-09-25 DIAGNOSIS — Z9079 Acquired absence of other genital organ(s): Secondary | ICD-10-CM | POA: Diagnosis not present

## 2019-09-25 LAB — COMPREHENSIVE METABOLIC PANEL
ALT: 28 U/L (ref 0–44)
AST: 28 U/L (ref 15–41)
Albumin: 4.4 g/dL (ref 3.5–5.0)
Alkaline Phosphatase: 85 U/L (ref 38–126)
Anion gap: 9 (ref 5–15)
BUN: 8 mg/dL (ref 6–20)
CO2: 19 mmol/L — ABNORMAL LOW (ref 22–32)
Calcium: 9.9 mg/dL (ref 8.9–10.3)
Chloride: 110 mmol/L (ref 98–111)
Creatinine, Ser: 0.75 mg/dL (ref 0.44–1.00)
GFR calc Af Amer: >60 mL/min (ref >60)
GFR calc non Af Amer: >60 mL/min (ref >60)
Glucose, Bld: 96 mg/dL (ref 70–99)
Potassium: 4.4 mmol/L (ref 3.5–5.1)
Sodium: 138 mmol/L (ref 135–145)
Total Bilirubin: 0.4 mg/dL (ref 0.3–1.2)
Total Protein: 7.1 g/dL (ref 6.5–8.1)

## 2019-09-25 LAB — CBC WITH DIFFERENTIAL/PLATELET
Abs Immature Granulocytes: 0.03 10*3/uL (ref 0.00–0.07)
Basophils Absolute: 0 10*3/uL (ref 0.0–0.1)
Basophils Relative: 0 %
Eosinophils Absolute: 0 10*3/uL (ref 0.0–0.5)
Eosinophils Relative: 0 %
HCT: 27.4 % — ABNORMAL LOW (ref 36.0–46.0)
Hemoglobin: 9 g/dL — ABNORMAL LOW (ref 12.0–15.0)
Immature Granulocytes: 1 %
Lymphocytes Relative: 19 %
Lymphs Abs: 0.9 10*3/uL (ref 0.7–4.0)
MCH: 34.1 pg — ABNORMAL HIGH (ref 26.0–34.0)
MCHC: 32.8 g/dL (ref 30.0–36.0)
MCV: 103.8 fL — ABNORMAL HIGH (ref 80.0–100.0)
Monocytes Absolute: 0.3 10*3/uL (ref 0.1–1.0)
Monocytes Relative: 7 %
Neutro Abs: 3.7 10*3/uL (ref 1.7–7.7)
Neutrophils Relative %: 73 %
Platelets: 219 10*3/uL (ref 150–400)
RBC: 2.64 MIL/uL — ABNORMAL LOW (ref 3.87–5.11)
RDW: 22.6 % — ABNORMAL HIGH (ref 11.5–15.5)
WBC: 5 10*3/uL (ref 4.0–10.5)
nRBC: 0 % (ref 0.0–0.2)

## 2019-09-25 LAB — SAMPLE TO BLOOD BANK

## 2019-09-25 MED ORDER — PALONOSETRON HCL INJECTION 0.25 MG/5ML
INTRAVENOUS | Status: AC
  Filled 2019-09-25: qty 5

## 2019-09-25 MED ORDER — DEXAMETHASONE SODIUM PHOSPHATE 10 MG/ML IJ SOLN
4.0000 mg | Freq: Once | INTRAMUSCULAR | Status: AC
  Administered 2019-09-25: 4 mg via INTRAVENOUS

## 2019-09-25 MED ORDER — PALONOSETRON HCL INJECTION 0.25 MG/5ML
0.2500 mg | Freq: Once | INTRAVENOUS | Status: AC
  Administered 2019-09-25: 0.25 mg via INTRAVENOUS

## 2019-09-25 MED ORDER — HEPARIN SOD (PORK) LOCK FLUSH 100 UNIT/ML IV SOLN
500.0000 [IU] | Freq: Once | INTRAVENOUS | Status: AC | PRN
  Administered 2019-09-25: 500 [IU]
  Filled 2019-09-25: qty 5

## 2019-09-25 MED ORDER — SODIUM CHLORIDE 0.9 % IV SOLN
150.0000 mg | Freq: Once | INTRAVENOUS | Status: AC
  Administered 2019-09-25: 150 mg via INTRAVENOUS
  Filled 2019-09-25: qty 150

## 2019-09-25 MED ORDER — SODIUM CHLORIDE 0.9 % IV SOLN
Freq: Once | INTRAVENOUS | Status: AC
  Filled 2019-09-25: qty 250

## 2019-09-25 MED ORDER — SODIUM CHLORIDE 0.9 % IV SOLN
652.5000 mg | Freq: Once | INTRAVENOUS | Status: AC
  Administered 2019-09-25: 650 mg via INTRAVENOUS
  Filled 2019-09-25: qty 65

## 2019-09-25 MED ORDER — SODIUM CHLORIDE 0.9 % IV SOLN
150.0000 mg | Freq: Once | INTRAVENOUS | Status: DC
  Filled 2019-09-25: qty 5

## 2019-09-25 MED ORDER — DEXAMETHASONE SODIUM PHOSPHATE 10 MG/ML IJ SOLN
INTRAMUSCULAR | Status: AC
  Filled 2019-09-25: qty 1

## 2019-09-25 MED ORDER — SODIUM CHLORIDE 0.9% FLUSH
10.0000 mL | Freq: Once | INTRAVENOUS | Status: AC
  Administered 2019-09-25: 10 mL
  Filled 2019-09-25: qty 10

## 2019-09-25 MED ORDER — PACLITAXEL PROTEIN-BOUND CHEMO INJECTION 100 MG
100.0000 mg/m2 | Freq: Once | INTRAVENOUS | Status: AC
  Administered 2019-09-25: 175 mg via INTRAVENOUS
  Filled 2019-09-25: qty 35

## 2019-09-25 MED ORDER — SODIUM CHLORIDE 0.9% FLUSH
10.0000 mL | INTRAVENOUS | Status: DC | PRN
  Administered 2019-09-25: 10 mL
  Filled 2019-09-25: qty 10

## 2019-09-25 NOTE — Patient Instructions (Signed)
[facility] Discharge Instructions for Patients Receiving Chemotherapy  Today you received the following chemotherapy agents: abraxane and carboplatin.  To help prevent nausea and vomiting after your treatment, we encourage you to take your nausea medication as directed.   If you develop nausea and vomiting that is not controlled by your nausea medication, call the clinic.   BELOW ARE SYMPTOMS THAT SHOULD BE REPORTED IMMEDIATELY:  *FEVER GREATER THAN 100.5 F  *CHILLS WITH OR WITHOUT FEVER  NAUSEA AND VOMITING THAT IS NOT CONTROLLED WITH YOUR NAUSEA MEDICATION  *UNUSUAL SHORTNESS OF BREATH  *UNUSUAL BRUISING OR BLEEDING  TENDERNESS IN MOUTH AND THROAT WITH OR WITHOUT PRESENCE OF ULCERS  *URINARY PROBLEMS  *BOWEL PROBLEMS  UNUSUAL RASH Items with * indicate a potential emergency and should be followed up as soon as possible.  Feel free to call the clinic should you have any questions or concerns. The clinic phone number is [phone].  Please show the [facility] at check-in to the Emergency Department and triage nurse.  Nanoparticle Albumin-Bound Paclitaxel injection What is this medicine? NANOPARTICLE ALBUMIN-BOUND PACLITAXEL (Na no PAHR ti kuhl al BYOO muhn-bound PAK li TAX el) is a chemotherapy drug. It targets fast dividing cells, like cancer cells, and causes these cells to die. This medicine is used to treat advanced breast cancer, lung cancer, and pancreatic cancer. This medicine may be used for other purposes; ask your health care provider or pharmacist if you have questions. COMMON BRAND NAME(S): Abraxane What should I tell my health care provider before I take this medicine? They need to know if you have any of these conditions:  kidney disease  liver disease  low blood counts, like low white cell, platelet, or red cell counts  lung or breathing disease, like asthma  tingling of the fingers or toes, or other nerve disorder  an  unusual or allergic reaction to paclitaxel, albumin, other chemotherapy, other medicines, foods, dyes, or preservatives  pregnant or trying to get pregnant  breast-feeding How should I use this medicine? This drug is given as an infusion into a vein. It is administered in a hospital or clinic by a specially trained health care professional. Talk to your pediatrician regarding the use of this medicine in children. Special care may be needed. Overdosage: If you think you have taken too much of this medicine contact a poison control center or emergency room at once. NOTE: This medicine is only for you. Do not share this medicine with others. What if I miss a dose? It is important not to miss your dose. Call your doctor or health care professional if you are unable to keep an appointment. What may interact with this medicine? This medicine may interact with the following medications:  antiviral medicines for hepatitis, HIV or AIDS  certain antibiotics like erythromycin and clarithromycin  certain medicines for fungal infections like ketoconazole and itraconazole  certain medicines for seizures like carbamazepine, phenobarbital, phenytoin  gemfibrozil  nefazodone  rifampin  St. John's wort This list may not describe all possible interactions. Give your health care provider a list of all the medicines, herbs, non-prescription drugs, or dietary supplements you use. Also tell them if you smoke, drink alcohol, or use illegal drugs. Some items may interact with your medicine. What should I watch for while using this medicine? Your condition will be monitored carefully while you are receiving this medicine. You will need important blood work done while you are taking this medicine. This medicine can cause  serious allergic reactions. If you experience allergic reactions like skin rash, itching or hives, swelling of the face, lips, or tongue, tell your doctor or health care professional right  away. In some cases, you may be given additional medicines to help with side effects. Follow all directions for their use. This drug may make you feel generally unwell. This is not uncommon, as chemotherapy can affect healthy cells as well as cancer cells. Report any side effects. Continue your course of treatment even though you feel ill unless your doctor tells you to stop. Call your doctor or health care professional for advice if you get a fever, chills or sore throat, or other symptoms of a cold or flu. Do not treat yourself. This drug decreases your body's ability to fight infections. Try to avoid being around people who are sick. This medicine may increase your risk to bruise or bleed. Call your doctor or health care professional if you notice any unusual bleeding. Be careful brushing and flossing your teeth or using a toothpick because you may get an infection or bleed more easily. If you have any dental work done, tell your dentist you are receiving this medicine. Avoid taking products that contain aspirin, acetaminophen, ibuprofen, naproxen, or ketoprofen unless instructed by your doctor. These medicines may hide a fever. Do not become pregnant while taking this medicine or for 6 months after stopping it. Women should inform their doctor if they wish to become pregnant or think they might be pregnant. Men should not father a child while taking this medicine or for 3 months after stopping it. There is a potential for serious side effects to an unborn child. Talk to your health care professional or pharmacist for more information. Do not breast-feed an infant while taking this medicine or for 2 weeks after stopping it. This medicine may interfere with the ability to get pregnant or to father a child. You should talk to your doctor or health care professional if you are concerned about your fertility. What side effects may I notice from receiving this medicine? Side effects that you should report  to your doctor or health care professional as soon as possible:  allergic reactions like skin rash, itching or hives, swelling of the face, lips, or tongue  breathing problems  changes in vision  fast, irregular heartbeat  low blood pressure  mouth sores  pain, tingling, numbness in the hands or feet  signs of decreased platelets or bleeding - bruising, pinpoint red spots on the skin, black, tarry stools, blood in the urine  signs of decreased red blood cells - unusually weak or tired, feeling faint or lightheaded, falls  signs of infection - fever or chills, cough, sore throat, pain or difficulty passing urine  signs and symptoms of liver injury like dark yellow or brown urine; general ill feeling or flu-like symptoms; light-colored stools; loss of appetite; nausea; right upper belly pain; unusually weak or tired; yellowing of the eyes or skin  swelling of the ankles, feet, hands  unusually slow heartbeat Side effects that usually do not require medical attention (report to your doctor or health care professional if they continue or are bothersome):  diarrhea  hair loss  loss of appetite  nausea, vomiting  tiredness This list may not describe all possible side effects. Call your doctor for medical advice about side effects. You may report side effects to FDA at 1-800-FDA-1088. Where should I keep my medicine? This drug is given in a hospital or clinic and  will not be stored at home. NOTE: This sheet is a summary. It may not cover all possible information. If you have questions about this medicine, talk to your doctor, pharmacist, or health care provider.  2020 [employer] Standard (2016-10-12 13:03:45)  Carboplatin injection What is this medicine? CARBOPLATIN (KAR boe pla tin) is a chemotherapy drug. It targets fast dividing cells, like cancer cells, and causes these cells to die. This medicine is used to treat ovarian cancer and many other cancers. This medicine  may be used for other purposes; ask your health care provider or pharmacist if you have questions. COMMON BRAND NAME(S): Paraplatin What should I tell my health care provider before I take this medicine? They need to know if you have any of these conditions:  blood disorders  hearing problems  kidney disease  recent or ongoing radiation therapy  an unusual or allergic reaction to carboplatin, cisplatin, other chemotherapy, other medicines, foods, dyes, or preservatives  pregnant or trying to get pregnant  breast-feeding How should I use this medicine? This drug is usually given as an infusion into a vein. It is administered in a hospital or clinic by a specially trained health care professional. Talk to your pediatrician regarding the use of this medicine in children. Special care may be needed. Overdosage: If you think you have taken too much of this medicine contact a poison control center or emergency room at once. NOTE: This medicine is only for you. Do not share this medicine with others. What if I miss a dose? It is important not to miss a dose. Call your doctor or health care professional if you are unable to keep an appointment. What may interact with this medicine?  medicines for seizures  medicines to increase blood counts like filgrastim, pegfilgrastim, sargramostim  some antibiotics like amikacin, gentamicin, neomycin, streptomycin, tobramycin  vaccines Talk to your doctor or health care professional before taking any of these medicines:  acetaminophen  aspirin  ibuprofen  ketoprofen  naproxen This list may not describe all possible interactions. Give your health care provider a list of all the medicines, herbs, non-prescription drugs, or dietary supplements you use. Also tell them if you smoke, drink alcohol, or use illegal drugs. Some items may interact with your medicine. What should I watch for while using this medicine? Your condition will be monitored  carefully while you are receiving this medicine. You will need important blood work done while you are taking this medicine. This drug may make you feel generally unwell. This is not uncommon, as chemotherapy can affect healthy cells as well as cancer cells. Report any side effects. Continue your course of treatment even though you feel ill unless your doctor tells you to stop. In some cases, you may be given additional medicines to help with side effects. Follow all directions for their use. Call your doctor or health care professional for advice if you get a fever, chills or sore throat, or other symptoms of a cold or flu. Do not treat yourself. This drug decreases your body's ability to fight infections. Try to avoid being around people who are sick. This medicine may increase your risk to bruise or bleed. Call your doctor or health care professional if you notice any unusual bleeding. Be careful brushing and flossing your teeth or using a toothpick because you may get an infection or bleed more easily. If you have any dental work done, tell your dentist you are receiving this medicine. Avoid taking products that contain aspirin, acetaminophen,  ibuprofen, naproxen, or ketoprofen unless instructed by your doctor. These medicines may hide a fever. Do not become pregnant while taking this medicine. Women should inform their doctor if they wish to become pregnant or think they might be pregnant. There is a potential for serious side effects to an unborn child. Talk to your health care professional or pharmacist for more information. Do not breast-feed an infant while taking this medicine. What side effects may I notice from receiving this medicine? Side effects that you should report to your doctor or health care professional as soon as possible:  allergic reactions like skin rash, itching or hives, swelling of the face, lips, or tongue  signs of infection - fever or chills, cough, sore throat, pain or  difficulty passing urine  signs of decreased platelets or bleeding - bruising, pinpoint red spots on the skin, black, tarry stools, nosebleeds  signs of decreased red blood cells - unusually weak or tired, fainting spells, lightheadedness  breathing problems  changes in hearing  changes in vision  chest pain  high blood pressure  low blood counts - This drug may decrease the number of white blood cells, red blood cells and platelets. You may be at increased risk for infections and bleeding.  nausea and vomiting  pain, swelling, redness or irritation at the injection site  pain, tingling, numbness in the hands or feet  problems with balance, talking, walking  trouble passing urine or change in the amount of urine Side effects that usually do not require medical attention (report to your doctor or health care professional if they continue or are bothersome):  hair loss  loss of appetite  metallic taste in the mouth or changes in taste This list may not describe all possible side effects. Call your doctor for medical advice about side effects. You may report side effects to FDA at 1-800-FDA-1088. Where should I keep my medicine? This drug is given in a hospital or clinic and will not be stored at home. NOTE: This sheet is a summary. It may not cover all possible information. If you have questions about this medicine, talk to your doctor, pharmacist, or health care provider.  2020 [employer]/Gold Standard (2007-05-16 14:38:05)

## 2019-10-01 ENCOUNTER — Other Ambulatory Visit: Payer: Commercial Managed Care - PPO

## 2019-10-01 NOTE — Progress Notes (Signed)
[place]  [phone] [phone]) [phone]     ID: [name] DOB: 1986-01-14  MR#: [id number]  SAY#:[id number]  Patient Care Team: System, Pcp Not In as PCP - General [name], [name], MD as Consulting Physician (Oncology) [name], RN as Oncology Nurse Navigator [name], RN as Oncology Nurse Navigator [name], MD as Consulting Physician (Plastic Surgery) [name], MD as Consulting Physician (General Surgery) Scot Dock, NP OTHER MD: PCP= [name] MD  CHIEF COMPLAINT: BRCA positive breast cancer  CURRENT TREATMENT: Neoadjuvant chemotherapy   INTERVAL HISTORY: Joy Reyes" returns today for follow up and treatment of her BRCA positive breast cancer, accompanied by her husband [name].  She started neoadjuvant chemotherapy consisting of carboplatin day 1, Abraxane days 1,8,15, repeated Q21 days x 4 cycles, on 07/10/2019. Today is day 8 cycle 4.  She did inject Nuvestym Thursday, Friday and Saturday (neupogen biosimilar) to prevent neutropenia.    She is scheduled for breast MRI on 10/03/2019 and for bilateral mastectomies and left axillary sentinel lymph node biopsy on 11/27/2019 under Dr. [name].   REVIEW OF SYSTEMS: Joy Reyes remains well.  She is still making up her mind about her upcoming surgery, and does have some questions about the nipple appearance after nipple sparing mastectomies.  She notes during chemotherapy last week she felt some mild numbness in her fingertips, however that resolved after she completed her chemotherapy.  She is feeling well otherwise and a detailed ROS was otherwise non contributory today.    HISTORY OF CURRENT ILLNESS: From the original intake note:  Joy Reyes has a history of breast cancer, diagnosed in 2012 while she lived in [place], [place], and treated in [place] under Dr [name].  We are obtaining data from that experience but according to the patient her tumor was  the size of an apricot.  She did not have axillary lymph node sampling or radiation.  She remembers 8 neoadjuvant chemotherapy treatments given every 2 weeks with the first including the "red drug", which suggest Cytoxan and Adriamycin in dose dense fashion x4 most likely followed by either Taxol or Taxotere in dose dense fashion x4.  After that experience and given her breast density and BRCA mutation our practice here would have been a yearly follow-up with MRI in addition to yearly mammography.   More recently Joy Reyes herself palpated a change in her right breast.  She brought it to Dr. [name] attention and underwent bilateral diagnostic mammography with tomography and left breast ultrasonography at [facility] on 06/14/2019 showing: breast density category D; there was an area of asymmetry in the upper outer quadrant of the left breast, and ultrasound of that area showed a 2.8 cm mass.  Ultrasound of the axilla showed 1.8 cm lymph node with cortical thickening.    In the right breast they saw only postsurgical changes, with 2 biopsy clips in the retroareolar area.  Accordingly on 06/19/2019 she proceeded to biopsy of the left breast area in question. 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  The left axillary lymph node biopsy was nondiagnostic, with very spare sampling of fibroadipose and lymphoid tissue.  Estrogen receptor was negative, with less than 1% uptake, progesterone receptor was negative, with less than 1% uptake, HER-2 was negative by immunohistochemistry with a score of 1+.  The patient's subsequent history is as detailed below.   PAST MEDICAL HISTORY: Past Medical History:  Diagnosis  Date  . Cancer [facility])    breast cancer 2012    PAST SURGICAL HISTORY: Past Surgical History:  Procedure Laterality Date  . BREAST SURGERY     lumpectomy on right breast  . CESAREAN SECTION MULTI-GESTATIONAL  N/A 06/01/2015   Procedure: CESAREAN SECTION MULTI-GESTATIONAL;  Surgeon: Waymon Amato, MD;  Location: [facility];  Service: Obstetrics;  Laterality: N/A;  . IR IMAGING GUIDED PORT INSERTION  07/05/2019  . LOBECTOMY Right 2012  . SALPINGECTOMY      FAMILY HISTORY: Family History  Problem Relation Age of Onset  . Breast cancer Maternal Grandmother        very late in life, lived to 82  . Breast cancer Paternal Grandmother        died at age 68  . Alzheimer's disease Paternal Grandfather     The patient's parents are both turning 3 in 2021.  The paternal grandfather died from Alzheimer's disease.  The paternal grandmother died from breast cancer at the age of 62.  The patient's father had 1 sister who had died as a child from meningitis.  He had no brothers.  On the mother side, a maternal grandmother did have breast cancer very late in life, living to be 18.  The paternal grandfather and the only maternal aunt had no history of cancer.  The patient has 1 brother, in good health, no sisters.   GYNECOLOGIC HISTORY:  No LMP recorded. Menarche: 34 years old Age at first live birth: 34 years old [place] P twins LMP regular as of May 2021 Contraceptive HRT no  Hysterectomy?  No BSO?  Status post bilateral salpingectomy, no oophorectomy   SOCIAL HISTORY: (updated May 2021)  [name] "Joy Reyes" works as a Music therapist.  Her husband [name]") works for an NGO (the [employer]) out of [place] which helps small agencies doing research set up and succeed.  Their twins are 33 years old. [name] has (2?) children from a prior marriage. The patient belongs to an orthodox denomination    ADVANCED DIRECTIVES: In the absence of any documentation to the contrary, the patient's spouse is their HCPOA.    HEALTH MAINTENANCE: Social History   Tobacco Use  . Smoking status: Never Smoker  . Smokeless tobacco: Never Used  Substance Use Topics  . Alcohol use: No  . Drug use: No     Colonoscopy: n/a  (age)  PAP: Up-to-date  Bone density: n/a (age)   Allergies  Allergen Reactions  . Paclitaxel     Anaphylactic per pt history in MD note    Current Outpatient Medications  Medication Sig Dispense Refill  . dexamethasone (DECADRON) 4 MG tablet Take 2 tablets (8 mg total) by mouth daily. Start the day after carboplatin chemotherapy (day 1 of each 21 days cycle) and continue for 2 days (days 2 and 3, counting chemotherapy day as day 1);  last dose the morning of day 4. 30 tablet 1  . filgrastim-aafi (NIVESTYM) 480 MCG/0.8ML SOSY injection Pt to self administered 480 mcg SQ on days 3,4 and 5 of weekly chemotherapy x 9 weeks 21.6 mL 0  . lidocaine-prilocaine (EMLA) cream Apply to affected area once 30 g 3  . LORazepam (ATIVAN) 0.5 MG tablet Take 1 tablet (0.5 mg total) by mouth at bedtime as needed (Nausea or vomiting). 20 tablet 0  . prochlorperazine (COMPAZINE) 10 MG tablet Take 1 tablet (10 mg total) by mouth every 6 (six) hours as needed (Nausea or vomiting). 30 tablet 1  No current facility-administered medications for this visit.    OBJECTIVE:  Vitals:   10/02/19 0854  BP: 120/72  Pulse: 72  Resp: 16  Temp: (!) 97 F (36.1 C)  SpO2: 100%     Body mass index is 21.53 kg/m.   Wt Readings from Last 3 Encounters:  10/02/19 141 lb 9.6 oz (64.2 kg)  09/25/19 139 lb 12.8 oz (63.4 kg)  09/18/19 145 lb 11.2 oz (66.1 kg)      ECOG FS:1 - Symptomatic but completely ambulatory GENERAL: Patient is a well appearing female in no acute distress HEENT:  Sclerae anicteric.  Mask in place.  Neck is supple.  NODES:  No cervical, supraclavicular, or axillary lymphadenopathy palpated.  BREAST EXAM:  Deferred. LUNGS:  Clear to auscultation bilaterally.  No wheezes or rhonchi. HEART:  Regular rate and rhythm. No murmur appreciated. ABDOMEN:  Soft, nontender.  Positive, normoactive bowel sounds. No organomegaly palpated. MSK:  No focal spinal tenderness to palpation. Full range of motion  bilaterally in the upper extremities. EXTREMITIES:  No peripheral edema.   SKIN:  Clear with no obvious rashes or skin changes. No nail dyscrasia. NEURO:  Nonfocal. Well oriented.  Appropriate affect.     LAB RESULTS:      Component Value Date/Time   NA 140 10/02/2019 0832   K 4.2 10/02/2019 0832   CL 110 10/02/2019 0832   CO2 23 10/02/2019 0832   GLUCOSE 99 10/02/2019 0832   BUN 13 10/02/2019 0832   CREATININE 0.62 10/02/2019 0832   CALCIUM 9.5 10/02/2019 0832   PROT 6.2 (L) 10/02/2019 0832   ALBUMIN 3.9 10/02/2019 0832   AST 19 10/02/2019 0832   ALT 24 10/02/2019 0832   ALKPHOS 57 10/02/2019 0832   BILITOT 0.4 10/02/2019 0832   GFRNONAA >60 10/02/2019 0832   GFRAA >60 10/02/2019 0832    No results found for: TOTALPROTELP, ALBUMINELP, A1GS, A2GS, BETS, BETA2SER, GAMS, MSPIKE, SPEI  Lab Results  Component Value Date   WBC 1.5 (L) 10/02/2019   NEUTROABS PENDING 10/02/2019   HGB 8.0 (L) 10/02/2019   HCT 23.9 (L) 10/02/2019   MCV 105.8 (H) 10/02/2019   PLT 210 10/02/2019    No results found for: LABCA2  No components found for: RSWNIO270  No results for input(s): INR in the last 168 hours.  No results found for: LABCA2  No results found for: JJK093  No results found for: GHW299  No results found for: BZJ696  No results found for: CA2729  No components found for: HGQUANT  No results found for: CEA1 / No results found for: CEA1   No results found for: AFPTUMOR  No results found for: CHROMOGRNA  No results found for: KPAFRELGTCHN, LAMBDASER, KAPLAMBRATIO (kappa/lambda light chains)  No results found for: HGBA, HGBA2QUANT, HGBFQUANT, HGBSQUAN (Hemoglobinopathy evaluation)   No results found for: LDH  No results found for: IRON, TIBC, IRONPCTSAT (Iron and TIBC)  No results found for: FERRITIN  Urinalysis No results found for: COLORURINE, APPEARANCEUR, LABSPEC, PHURINE, GLUCOSEU, HGBUR, BILIRUBINUR, KETONESUR, PROTEINUR, UROBILINOGEN, NITRITE,  LEUKOCYTESUR   STUDIES: No results found.  ELIGIBLE FOR AVAILABLE RESEARCH PROTOCOL: [id number]?  ASSESSMENT: 34 y.o. BRCA positive [facility] woman  (0) BRCA positivity:  (a) s/p bilateral salpingectomy  (b) pelvic ultrasound including transvaginal study 06/01/2019 unremarkable, as was CA 125  (c) planning on bilateral mastectomies  (d) bilateral salpingectomy 11/28/2013, age 32 (no oophorectomy)  RIGHT BREAST CANCER (1) Status post right lumpectomy 2012 for a [        ]  (  a) received neoadjuvant chemotherapy consisting of 4 cycles of doxorubicin and cyclophosphamide, followed by paclitaxel x1 (developed anaphylaxis with the first dose), then 4 doses of docetaxel completed August 2012  (b) [no adjuvant radiation]  LEFT BREAST CANCER (2) Status post left breast upper outer quadrant biopsy 06/19/2019 for a clinical T2 N1, stage IIIB invasive ductal carcinoma, grade 3, triple negative  (a) left axillary lymph node biopsy 07/12/2019 nondiagnostic [discordant]  (b) chest abdomen and pelvis CT scan 07/05/2019 shows small left axillary lymph nodes, 1.5 cm enhancement in the left breast, no internal mammary adenopathy, no evidence of metastatic disease  (c) bone scan 07/13/2019 shows no evidence of bony metastatic disease  (d) biopsy of RIGHT breast mass 07/18/2019  (3) neoadjuvant chemotherapy consisting of carboplatin day 1, Abraxane days 1,8,15, repeated Q21 days x 4 cycles, starting 07/10/2019  (4) definitive surgery to follow: Consider nipple sparing mastectomies  (5) adjuvant radiation as appropriate  (6) consider adjuvant OLAPARIB   PLAN: Joy Reyes continues on neoadjuvant chemotherapy with Abraxane and Carboplatin today.  She is doing well.  Her neuropathy was brief and now resolved.  Her ANC is 0.5.  She will not be able to receive chemotherapy today.  She will take Nivestym at home today, tomorrow, Thursday, and Friday.  We will see her back in one week to evaluate.  We discussed  the fact that her MRI is tomorrow.  I sent Dr. [name] the question about nipple appearance following nipple sparing mastectomies.  Either myself or Dr. [name] will let the patient know about this, and hopefully this will help with Maggie's surgical decision.    Joy Reyes will continue with healthy diet and activity level.  We will see her back next week for labs, f/u, and her weekly Abraxane.  She knows to call for any other issue that may develop before the next visit.    Total encounter time 30 minutes.Wilber Bihari, NP 10/02/19 9:11 AM Medical Oncology and Hematology [facility] [facility] [place], [place] [address] Tel. [phone]    Fax. [phone]   *Total Encounter Time as defined by the Centers for Medicare and Medicaid Services includes, in addition to the face-to-face time of a patient visit (documented in the note above) non-face-to-face time: obtaining and reviewing outside history, ordering and reviewing medications, tests or procedures, care coordination (communications with other health care professionals or caregivers) and documentation in the medical record.

## 2019-10-02 ENCOUNTER — Telehealth: Payer: Self-pay | Admitting: Adult Health

## 2019-10-02 ENCOUNTER — Ambulatory Visit: Payer: Commercial Managed Care - PPO

## 2019-10-02 ENCOUNTER — Encounter: Payer: Self-pay | Admitting: Adult Health

## 2019-10-02 ENCOUNTER — Encounter: Payer: Self-pay | Admitting: *Deleted

## 2019-10-02 ENCOUNTER — Inpatient Hospital Stay: Payer: Commercial Managed Care - PPO

## 2019-10-02 ENCOUNTER — Ambulatory Visit: Payer: Commercial Managed Care - PPO | Admitting: Adult Health

## 2019-10-02 ENCOUNTER — Other Ambulatory Visit: Payer: Commercial Managed Care - PPO

## 2019-10-02 ENCOUNTER — Other Ambulatory Visit: Payer: Self-pay

## 2019-10-02 ENCOUNTER — Inpatient Hospital Stay (HOSPITAL_BASED_OUTPATIENT_CLINIC_OR_DEPARTMENT_OTHER): Payer: Commercial Managed Care - PPO | Admitting: Adult Health

## 2019-10-02 VITALS — BP 120/72 | HR 72 | Temp 97.0°F | Resp 16 | Ht 68.0 in | Wt 141.6 lb

## 2019-10-02 DIAGNOSIS — C50412 Malignant neoplasm of upper-outer quadrant of left female breast: Secondary | ICD-10-CM

## 2019-10-02 DIAGNOSIS — Z1501 Genetic susceptibility to malignant neoplasm of breast: Secondary | ICD-10-CM

## 2019-10-02 DIAGNOSIS — Z171 Estrogen receptor negative status [ER-]: Secondary | ICD-10-CM

## 2019-10-02 DIAGNOSIS — C50911 Malignant neoplasm of unspecified site of right female breast: Secondary | ICD-10-CM | POA: Diagnosis not present

## 2019-10-02 DIAGNOSIS — Z95828 Presence of other vascular implants and grafts: Secondary | ICD-10-CM

## 2019-10-02 DIAGNOSIS — C50211 Malignant neoplasm of upper-inner quadrant of right female breast: Secondary | ICD-10-CM

## 2019-10-02 DIAGNOSIS — Z5111 Encounter for antineoplastic chemotherapy: Secondary | ICD-10-CM | POA: Diagnosis not present

## 2019-10-02 LAB — CBC WITH DIFFERENTIAL/PLATELET
Abs Immature Granulocytes: 0.01 10*3/uL (ref 0.00–0.07)
Basophils Absolute: 0 10*3/uL (ref 0.0–0.1)
Basophils Relative: 1 %
Eosinophils Absolute: 0 10*3/uL (ref 0.0–0.5)
Eosinophils Relative: 1 %
HCT: 23.9 % — ABNORMAL LOW (ref 36.0–46.0)
Hemoglobin: 8 g/dL — ABNORMAL LOW (ref 12.0–15.0)
Immature Granulocytes: 1 %
Lymphocytes Relative: 52 %
Lymphs Abs: 0.8 10*3/uL (ref 0.7–4.0)
MCH: 35.4 pg — ABNORMAL HIGH (ref 26.0–34.0)
MCHC: 33.5 g/dL (ref 30.0–36.0)
MCV: 105.8 fL — ABNORMAL HIGH (ref 80.0–100.0)
Monocytes Absolute: 0.1 10*3/uL (ref 0.1–1.0)
Monocytes Relative: 10 %
Neutro Abs: 0.5 10*3/uL — ABNORMAL LOW (ref 1.7–7.7)
Neutrophils Relative %: 35 %
Platelets: 210 10*3/uL (ref 150–400)
RBC: 2.26 MIL/uL — ABNORMAL LOW (ref 3.87–5.11)
RDW: 19.5 % — ABNORMAL HIGH (ref 11.5–15.5)
WBC: 1.5 10*3/uL — ABNORMAL LOW (ref 4.0–10.5)
nRBC: 0 % (ref 0.0–0.2)

## 2019-10-02 LAB — COMPREHENSIVE METABOLIC PANEL
ALT: 24 U/L (ref 0–44)
AST: 19 U/L (ref 15–41)
Albumin: 3.9 g/dL (ref 3.5–5.0)
Alkaline Phosphatase: 57 U/L (ref 38–126)
Anion gap: 7 (ref 5–15)
BUN: 13 mg/dL (ref 6–20)
CO2: 23 mmol/L (ref 22–32)
Calcium: 9.5 mg/dL (ref 8.9–10.3)
Chloride: 110 mmol/L (ref 98–111)
Creatinine, Ser: 0.62 mg/dL (ref 0.44–1.00)
GFR calc Af Amer: >60 mL/min (ref >60)
GFR calc non Af Amer: >60 mL/min (ref >60)
Glucose, Bld: 99 mg/dL (ref 70–99)
Potassium: 4.2 mmol/L (ref 3.5–5.1)
Sodium: 140 mmol/L (ref 135–145)
Total Bilirubin: 0.4 mg/dL (ref 0.3–1.2)
Total Protein: 6.2 g/dL — ABNORMAL LOW (ref 6.5–8.1)

## 2019-10-02 LAB — SAMPLE TO BLOOD BANK

## 2019-10-02 MED ORDER — SODIUM CHLORIDE 0.9% FLUSH
10.0000 mL | Freq: Once | INTRAVENOUS | Status: AC
  Administered 2019-10-02: 10 mL
  Filled 2019-10-02: qty 10

## 2019-10-02 NOTE — Telephone Encounter (Signed)
Scheduled appts per 8/10 los. Pt confirmed appt date and time.  

## 2019-10-03 ENCOUNTER — Ambulatory Visit
Admission: RE | Admit: 2019-10-03 | Discharge: 2019-10-03 | Disposition: A | Discharge status: Home / Self Care | Payer: Commercial Managed Care - PPO | Source: Ambulatory Visit | Attending: Adult Health | Admitting: Adult Health

## 2019-10-03 DIAGNOSIS — C50412 Malignant neoplasm of upper-outer quadrant of left female breast: Secondary | ICD-10-CM

## 2019-10-03 DIAGNOSIS — Z171 Estrogen receptor negative status [ER-]: Secondary | ICD-10-CM

## 2019-10-03 MED ORDER — GADOBUTROL 1 MMOL/ML IV SOLN
5.0000 mL | Freq: Once | INTRAVENOUS | Status: AC | PRN
  Administered 2019-10-03: 5 mL via INTRAVENOUS

## 2019-10-05 ENCOUNTER — Ambulatory Visit: Payer: Commercial Managed Care - PPO

## 2019-10-05 ENCOUNTER — Encounter: Payer: Self-pay | Admitting: *Deleted

## 2019-10-09 ENCOUNTER — Inpatient Hospital Stay: Payer: Commercial Managed Care - PPO

## 2019-10-09 ENCOUNTER — Telehealth: Payer: Self-pay

## 2019-10-09 ENCOUNTER — Inpatient Hospital Stay (HOSPITAL_BASED_OUTPATIENT_CLINIC_OR_DEPARTMENT_OTHER): Payer: Commercial Managed Care - PPO | Admitting: Nurse Practitioner

## 2019-10-09 ENCOUNTER — Encounter: Payer: Self-pay | Admitting: Nurse Practitioner

## 2019-10-09 ENCOUNTER — Other Ambulatory Visit: Payer: Commercial Managed Care - PPO

## 2019-10-09 ENCOUNTER — Ambulatory Visit: Payer: Commercial Managed Care - PPO

## 2019-10-09 ENCOUNTER — Telehealth: Payer: Self-pay | Admitting: Nurse Practitioner

## 2019-10-09 ENCOUNTER — Other Ambulatory Visit: Payer: Self-pay

## 2019-10-09 ENCOUNTER — Encounter: Payer: Self-pay | Admitting: *Deleted

## 2019-10-09 ENCOUNTER — Ambulatory Visit: Payer: Commercial Managed Care - PPO | Admitting: Nurse Practitioner

## 2019-10-09 VITALS — BP 113/72 | HR 67 | Temp 98.1°F | Resp 18 | Ht 68.0 in | Wt 146.6 lb

## 2019-10-09 DIAGNOSIS — Z171 Estrogen receptor negative status [ER-]: Secondary | ICD-10-CM

## 2019-10-09 DIAGNOSIS — C50412 Malignant neoplasm of upper-outer quadrant of left female breast: Secondary | ICD-10-CM

## 2019-10-09 DIAGNOSIS — C50211 Malignant neoplasm of upper-inner quadrant of right female breast: Secondary | ICD-10-CM

## 2019-10-09 DIAGNOSIS — Z5111 Encounter for antineoplastic chemotherapy: Secondary | ICD-10-CM | POA: Diagnosis not present

## 2019-10-09 DIAGNOSIS — C50911 Malignant neoplasm of unspecified site of right female breast: Secondary | ICD-10-CM

## 2019-10-09 DIAGNOSIS — Z95828 Presence of other vascular implants and grafts: Secondary | ICD-10-CM

## 2019-10-09 LAB — COMPREHENSIVE METABOLIC PANEL
ALT: 42 U/L (ref 0–44)
AST: 30 U/L (ref 15–41)
Albumin: 3.9 g/dL (ref 3.5–5.0)
Alkaline Phosphatase: 50 U/L (ref 38–126)
Anion gap: 8 (ref 5–15)
BUN: 10 mg/dL (ref 6–20)
CO2: 23 mmol/L (ref 22–32)
Calcium: 9.8 mg/dL (ref 8.9–10.3)
Chloride: 110 mmol/L (ref 98–111)
Creatinine, Ser: 0.75 mg/dL (ref 0.44–1.00)
GFR calc Af Amer: >60 mL/min (ref >60)
GFR calc non Af Amer: >60 mL/min (ref >60)
Glucose, Bld: 111 mg/dL — ABNORMAL HIGH (ref 70–99)
Potassium: 4.5 mmol/L (ref 3.5–5.1)
Sodium: 141 mmol/L (ref 135–145)
Total Bilirubin: <0.2 mg/dL — ABNORMAL LOW (ref 0.3–1.2)
Total Protein: 6.4 g/dL — ABNORMAL LOW (ref 6.5–8.1)

## 2019-10-09 LAB — CBC WITH DIFFERENTIAL/PLATELET
Abs Immature Granulocytes: 0.01 10*3/uL (ref 0.00–0.07)
Basophils Absolute: 0 10*3/uL (ref 0.0–0.1)
Basophils Relative: 1 %
Eosinophils Absolute: 0 10*3/uL (ref 0.0–0.5)
Eosinophils Relative: 2 %
HCT: 25.3 % — ABNORMAL LOW (ref 36.0–46.0)
Hemoglobin: 8.3 g/dL — ABNORMAL LOW (ref 12.0–15.0)
Immature Granulocytes: 1 %
Lymphocytes Relative: 50 %
Lymphs Abs: 1 10*3/uL (ref 0.7–4.0)
MCH: 35.9 pg — ABNORMAL HIGH (ref 26.0–34.0)
MCHC: 32.8 g/dL (ref 30.0–36.0)
MCV: 109.5 fL — ABNORMAL HIGH (ref 80.0–100.0)
Monocytes Absolute: 0.2 10*3/uL (ref 0.1–1.0)
Monocytes Relative: 12 %
Neutro Abs: 0.6 10*3/uL — ABNORMAL LOW (ref 1.7–7.7)
Neutrophils Relative %: 34 %
Platelets: 111 10*3/uL — ABNORMAL LOW (ref 150–400)
RBC: 2.31 MIL/uL — ABNORMAL LOW (ref 3.87–5.11)
RDW: 19.9 % — ABNORMAL HIGH (ref 11.5–15.5)
WBC: 1.9 10*3/uL — ABNORMAL LOW (ref 4.0–10.5)
nRBC: 0 % (ref 0.0–0.2)

## 2019-10-09 MED ORDER — SODIUM CHLORIDE 0.9% FLUSH
10.0000 mL | Freq: Once | INTRAVENOUS | Status: AC
  Administered 2019-10-09: 10 mL
  Filled 2019-10-09: qty 10

## 2019-10-09 MED ORDER — FILGRASTIM-AAFI 480 MCG/0.8ML IJ SOSY: PREFILLED_SYRINGE | INTRAMUSCULAR | 0 refills | Status: DC

## 2019-10-09 MED ORDER — FILGRASTIM-SNDZ 480 MCG/0.8ML IJ SOSY
480.0000 ug | PREFILLED_SYRINGE | Freq: Once | INTRAMUSCULAR | Status: AC
  Administered 2019-10-09: 480 ug via SUBCUTANEOUS

## 2019-10-09 MED ORDER — FILGRASTIM-SNDZ 480 MCG/0.8ML IJ SOSY
PREFILLED_SYRINGE | INTRAMUSCULAR | Status: AC
  Filled 2019-10-09: qty 0.8

## 2019-10-09 NOTE — Patient Instructions (Signed)

## 2019-10-09 NOTE — Telephone Encounter (Signed)
Scheduled appointments per 8/17 scheduling message. Patient is aware of appointments dates and times.

## 2019-10-09 NOTE — Progress Notes (Signed)
[place]   [phone] Fax:(336) [phone]   Clinic Follow up Note   Patient Care Team: System, Pcp Not In as PCP - General [name], [name], MD as Consulting Physician (Oncology) [name], RN as Oncology Nurse Navigator [name], RN as Oncology Nurse Navigator [name], MD as Consulting Physician (Plastic Surgery) [name], MD as Consulting Physician (General Surgery) 10/09/2019  CHIEF COMPLAINT: Follow-up BRCA positive left breast cancer  CURRENT THERAPY: neoadjuvant chemotherapy consisting of carboplatin day 1, Abraxane days 1,8,15, repeated Q21 days x 4 cycles, on 07/10/2019, self injecting Nivestym 428mg on days 3, 4, 5  INTERVAL HISTORY: Joy Reyes for follow-up and treatment as scheduled.  She completed cycle 4-day 1 carbo/Abraxane on 8/3.  She tolerates treatment very well with no major side effects other than fatigue.  She returned for cycle 4-day 8 Abraxane on 8/10, treatment was held for neutropenia ANC 0.5.  She only received 2 doses of the Nivestym injection which she gave herself on 8/11 and 8/12 of last week.  She felt well over the weekend, enjoyed her birthday.  Denies fever, chills, cough, chest pain, dyspnea, mucositis, rash, neuropathy except tingling during the last Abraxane infusion that resolved when treatment was over.  She has decided not to proceed with nipple sparing mastectomy because she does not want to deal with erect nipples.    MEDICAL HISTORY:  Past Medical History:  Diagnosis Date  . Cancer ([facility]    breast cancer 2012    SURGICAL HISTORY: Past Surgical History:  Procedure Laterality Date  . BREAST SURGERY     lumpectomy on right breast  . CESAREAN SECTION MULTI-GESTATIONAL N/A 06/01/2015   Procedure: CESAREAN SECTION MULTI-GESTATIONAL;  Surgeon: EWaymon Amato MD;  [facility];  Service: Obstetrics;  Laterality: N/A;  . IR IMAGING GUIDED PORT INSERTION  07/05/2019  .  LOBECTOMY Right 2012  . SALPINGECTOMY      I have reviewed the social history and family history with the patient and they are unchanged from previous note.  ALLERGIES:  is allergic to paclitaxel.  MEDICATIONS:  Current Outpatient Medications  Medication Sig Dispense Refill  . dexamethasone (DECADRON) 4 MG tablet Take 2 tablets (8 mg total) by mouth daily. Start the day after carboplatin chemotherapy (day 1 of each 21 days cycle) and continue for 2 days (days 2 and 3, counting chemotherapy day as day 1);  last dose the morning of day 4. 30 tablet 1  . filgrastim-aafi (NIVESTYM) 480 MCG/0.8ML SOSY injection Pt to self administered 480 mcg SQ on days 3,4 and 5 of weekly chemotherapy 4.8 mL 0  . lidocaine-prilocaine (EMLA) cream Apply to affected area once 30 g 3  . LORazepam (ATIVAN) 0.5 MG tablet Take 1 tablet (0.5 mg total) by mouth at bedtime as needed (Nausea or vomiting). 20 tablet 0  . prochlorperazine (COMPAZINE) 10 MG tablet Take 1 tablet (10 mg total) by mouth every 6 (six) hours as needed (Nausea or vomiting). 30 tablet 1   No current facility-administered medications for this visit.    PHYSICAL EXAMINATION: ECOG PERFORMANCE STATUS: 0 - Asymptomatic  Vitals:   10/09/19 0839  BP: 113/72  Pulse: 67  Resp: 18  Temp: 98.1 F (36.7 C)  SpO2: (!) 18%   Filed Weights   10/09/19 0839  Weight: 146 lb 9.6 oz (66.5 kg)    GENERAL:alert, no distress and comfortable SKIN: No rash to exposed skin EYES:  sclera clear LUNGS: normal breathing effort  HEART:  no lower extremity edema NEURO: alert & oriented x 3 with fluent speech Breast exam: No palpable mass in either breast or axilla that I could appreciate PAC without erythema  LABORATORY DATA:  I have reviewed the data as listed CBC Latest Ref Rng & Units 10/09/2019 10/02/2019 09/25/2019  WBC 4.0 - 10.5 K/uL 1.9(L) 1.5(L) 5.0  Hemoglobin 12.0 - 15.0 g/dL 8.3(L) 8.0(L) 9.0(L)  Hematocrit 36 - 46 % 25.3(L) 23.9(L) 27.4(L)    Platelets 150 - 400 K/uL 111(L) 210 219     CMP Latest Ref Rng & Units 10/09/2019 10/02/2019 09/25/2019  Glucose 70 - 99 mg/dL 111(H) 99 96  BUN 6 - 20 mg/dL 10 13 8   Creatinine 0.44 - 1.00 mg/dL 0.75 0.62 0.75  Sodium 135 - 145 mmol/L 141 140 138  Potassium 3.5 - 5.1 mmol/L 4.5 4.2 4.4  Chloride 98 - 111 mmol/L 110 110 110  CO2 22 - 32 mmol/L 23 23 19(L)  Calcium 8.9 - 10.3 mg/dL 9.8 9.5 9.9  Total Protein 6.5 - 8.1 g/dL 6.4(L) 6.2(L) 7.1  Total Bilirubin 0.3 - 1.2 mg/dL <0.2(L) 0.4 0.4  Alkaline Phos 38 - 126 U/L 50 57 85  AST 15 - 41 U/L 30 19 28   ALT 0 - 44 U/L 42 24 28      RADIOGRAPHIC STUDIES: I have personally reviewed the radiological images as listed and agreed with the findings in the report. No results found.   ASSESSMENT & PLAN: 34 year old female with  1.  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  Staging work-up negative for metastatic disease -Began neoadjuvant chemo consisting of carboplatin day 1, Abraxane days 1, 8, 15 q. 21 days x 4 cycles starting 07/10/2019 -MRI on 10/03/2019 showed no residual mass identified in the superior left breast, mild non-mass enhancement slightly superior to the biopsy clip artifact in the upper outer quadrant of the left breast spans a maximum of 9 mm, on pretreatment MRI the known malignancy in the upper outer left breast measured 2.8 x 3.0 x 2.7 cm.  Stable axillary lymph nodes 2.  History of right breast cancer in 2012 -s/p neoadjuvant chemo with doxorubicin and cyclophosphamide x4 followed by paclitaxel x1 then changed to docetaxel x4 after allergic reaction.   -She did not receive adjuvant radiation following definitive lumpectomy  3. BRCA mutation (+)  -S/p bilateral salpingectomy 11/28/2013, no oophorectomy.  Pelvic ultrasound and Ca1 25 unremarkable on 06/01/2019 -Planning bilateral mastectomies  Disposition: Ms.  Reyes appears stable.  She is s/p cycle 4-day 1 carboplatin and Abraxane.  She tolerates treatment very well overall except mild fatigue.  She is able to recover and function well.  The left breast mass is not palpable. Repeat breast MRI shows significant positive response to neoadjuvant treatment.  Cycle 4-day 8 was held on 8/10 due to neutropenia ANC 0.5.  She administered Nivestym on 8/11 and 8/12, there were logistical issues getting the third dose approved/delivered.  Today she has persistent neutropenia ANC 0.6 with mild anemia and thrombocytopenia.  We are holding Abraxane.  She received Zarxio 480 mcg in clinic.  I refilled Nivestym for her to self administer a dose on 8/18 and 8/19.  She will return for repeat labs on 8/20 and anticipate receiving another dose at that time. If she does not get the doses delivered, she will return for these injections to be [facility] at [facility].  She tells me she does not plan to do the nipple sparing mastectomy due to not wanting to deal with erect nipples.  I informed the surgical team. The plan is for her to return for lab, f/u, and Abraxane on 8/24.    All questions were answered. The patient knows to call the clinic with any problems, questions or concerns. No barriers to learning were detected.  Total encounter time was 35 minutes.     Alla Feeling, NP 10/09/19

## 2019-10-09 NOTE — Telephone Encounter (Signed)
Called spoke with pt about coming in rest of week to receive daily injections with a lab and injection Friday pt is aware and agreeable

## 2019-10-10 ENCOUNTER — Inpatient Hospital Stay: Payer: Commercial Managed Care - PPO

## 2019-10-10 ENCOUNTER — Other Ambulatory Visit: Payer: Self-pay | Admitting: Nurse Practitioner

## 2019-10-10 ENCOUNTER — Telehealth: Payer: Self-pay | Admitting: Nurse Practitioner

## 2019-10-10 ENCOUNTER — Other Ambulatory Visit: Payer: Self-pay

## 2019-10-10 DIAGNOSIS — Z171 Estrogen receptor negative status [ER-]: Secondary | ICD-10-CM

## 2019-10-10 DIAGNOSIS — Z5111 Encounter for antineoplastic chemotherapy: Secondary | ICD-10-CM | POA: Diagnosis not present

## 2019-10-10 DIAGNOSIS — C50412 Malignant neoplasm of upper-outer quadrant of left female breast: Secondary | ICD-10-CM

## 2019-10-10 MED ORDER — FILGRASTIM-SNDZ 480 MCG/0.8ML IJ SOSY
PREFILLED_SYRINGE | INTRAMUSCULAR | Status: AC
  Filled 2019-10-10: qty 0.8

## 2019-10-10 MED ORDER — FILGRASTIM-SNDZ 480 MCG/0.8ML IJ SOSY
480.0000 ug | PREFILLED_SYRINGE | Freq: Every day | INTRAMUSCULAR | Status: DC
  Administered 2019-10-10: 480 ug via SUBCUTANEOUS

## 2019-10-10 NOTE — Progress Notes (Signed)
Per Seth Bake, no prior auth required for zarxio

## 2019-10-10 NOTE — Progress Notes (Signed)
zarxio

## 2019-10-10 NOTE — Telephone Encounter (Signed)
Message was sent to scheduling pool. Pt is already scheduled per 8/17 los.

## 2019-10-10 NOTE — Patient Instructions (Signed)

## 2019-10-11 ENCOUNTER — Ambulatory Visit: Payer: Commercial Managed Care - PPO

## 2019-10-11 ENCOUNTER — Telehealth: Payer: Self-pay | Admitting: *Deleted

## 2019-10-11 NOTE — Telephone Encounter (Signed)
Received call from pt to requesting to cancel injections for today and tomorrow as she received her home injection. appts for injections cancel

## 2019-10-11 NOTE — Progress Notes (Signed)
[place]  Telephone:([phone]) [phone] Fax:(336) [phone]     ID: Joy Reyes DOB: [birth year]  MR#: [id number]  GTX#:[id number]  Patient Care Team: Joy Reyes, Joy Reyes as Joy - General Joy Reyes, Joy Reyes as Consulting Physician (Oncology) Joy Kaufmann, RN as Oncology Nurse Navigator Joy Germany, RN as Oncology Nurse Navigator Joy Limbo, Reyes as Consulting Physician (Plastic Surgery) Joy Mesa, Reyes as Consulting Physician (General Surgery) Joy Cruel, Reyes OTHER Reyes: Joy= Joy Sapp Reyes  CHIEF COMPLAINT: BRCA positive breast cancer  CURRENT TREATMENT: Awaiting definitive surgery   INTERVAL HISTORY: Joy Reyes" returns today for follow up and treatment of her BRCA positive breast cancer, accompanied by her husband Joy Reyes.  She she had her last neoadjuvant chemotherapy dose on 09/25/2019.  She was scheduled for 2 more doses of Abraxane, but her counts were low despite growth factor support.  She was scheduled for treatment today and her counts have recovered but she is reporting early neuropathy, which she had not experienced before.  Accordingly we are omitting the last 2 Taxol doses.  She underwent breast MRI on 10/03/2019 showing: breast composition C; significant positive response to interval neoadjuvant therapy; no residual mass identified Reyes superior left breast; 9 mm mild non-mass enhancement slightly superior to biopsy clip artifact Reyes upper-outer left breast; stable appearance of left axillary lymph nodes; no evidence right breast malignancy.  She is scheduled for bilateral mastectomies and left axillary sentinel lymph node biopsy on 11/27/2019 under Dr. Georgette Reyes.  She still has not decided if she would like nipple sparing mastectomies but is leaning against it   REVIEW OF SYSTEMS: Joy Reyes is recovering from her treatment but she still "feels draggy".  She and Joy Reyes took their 33 year old to [employer] to college and even though there are  still other children Reyes the family "the house feels empty".  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  It is not more noticeable at night or any other time and it is not causing her problems with activities of daily living at this time.  Detailed review of systems was otherwise stable    HISTORY OF CURRENT ILLNESS: From the original intake note:  Joy Reyes has a history of breast cancer, diagnosed Reyes 2012 while she lived [place], [place], and treated Re[place] under Dr [name].  We are obtaining data from that experience but according to the patient her tumor was the size of an apricot.  She did not have axillary lymph node sampling or radiation.  She remembers 8 neoadjuvant chemotherapy treatments given every 2 weeks with the first including the "red drug", which suggest Cytoxan and Adriamycin Reyes dose dense fashion x4 most likely followed by either Taxol or Taxotere Reyes dose dense fashion x4.  After that experience and given her breast density and BRCA mutation our practice here would have been a yearly follow-up with MRI Reyes addition to yearly mammography.   More recently Joy Reyes herself palpated a change Reyes her right breast.  She brought it to Dr. Trecia Reyes attention and underwent bilateral diagnostic mammography with tomography and left breast ultrasonography at [facility] on 06/14/2019 showing: breast density category D; there was an area of asymmetry Reyes the upper outer quadrant of the left breast, and ultrasound of that area showed a 2.8 cm mass.  Ultrasound of the axilla showed 1.8 cm lymph node with cortical thickening.    Reyes  the right breast they saw only postsurgical changes, with 2 biopsy clips Reyes the retroareolar area.  Accordingly on 06/19/2019 she proceeded to biopsy of the left breast area Reyes question. 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  The left axillary lymph node biopsy was nondiagnostic, with very spare sampling of fibroadipose and lymphoid tissue.  Estrogen receptor was negative, with less than 1% uptake, progesterone receptor was negative, with less than 1% uptake, HER-2 was negative by immunohistochemistry with a score of 1+.  The patient's subsequent history is as detailed below.   PAST MEDICAL HISTORY: Past Medical History:  Diagnosis Date  . Cancer [facility])    breast cancer 2012    PAST SURGICAL HISTORY: Past Surgical History:  Procedure Laterality Date  . BREAST SURGERY     lumpectomy on right breast  . CESAREAN SECTION MULTI-GESTATIONAL N/A 06/01/2015   Procedure: CESAREAN SECTION MULTI-GESTATIONAL;  Surgeon: Joy Amato, Reyes;  Location: [facility];  Service: Obstetrics;  Laterality: N/A;  . IR IMAGING GUIDED PORT INSERTION  07/05/2019  . LOBECTOMY Right 2012  . SALPINGECTOMY      FAMILY HISTORY: Family History  Problem Relation Age of Onset  . Breast cancer Maternal Grandmother        very late Reyes life, lived to 17  . Breast cancer Paternal Grandmother        died at age 15  . Alzheimer's disease Paternal Grandfather     The patient's parents are both turning 58 Reyes 2021.  The paternal grandfather died from Alzheimer's disease.  The paternal grandmother died from breast cancer at the age of 57.  The patient's father had 1 sister who had died as a child from meningitis.  He had no brothers.  On the mother side, a maternal grandmother did have breast cancer very late Reyes life, living to be 51.  The paternal grandfather and the only maternal aunt had no history of cancer.  The patient has 1 brother, Reyes good health, no sisters.   GYNECOLOGIC HISTORY:  No LMP recorded. Menarche: 34 years old Age at first live birth: 34 years old [place] P twins LMP regular as of May 2021 Contraceptive HRT no  Hysterectomy?  No BSO?  Status post bilateral salpingectomy, no  oophorectomy   SOCIAL HISTORY: (updated May 2021)  Joy Reyes "Joy Reyes" works as a Music therapist.  Her husband Joy Reyes") works for an NGO (the [employer]) out of [place] which helps small agencies doing research set up and succeed.  Their twins are     years old. Joy Reyes has (2?) children from a prior marriage. The patient belongs to an orthodox denomination    ADVANCED DIRECTIVES: Reyes the absence of any documentation to the contrary, the patient's spouse is their HCPOA.    HEALTH MAINTENANCE: Social History   Tobacco Use  . Smoking status: Never Smoker  . Smokeless tobacco: Never Used  Substance Use Topics  . Alcohol use: No  . Drug use: No     Colonoscopy: n/a (age)  PAP: Up-to-date  Bone density: n/a (age)   Allergies  Allergen Reactions  . Paclitaxel     Anaphylactic per pt history Reyes Reyes note    Current Outpatient Medications  Medication Sig Dispense Refill  . dexamethasone (DECADRON) 4 MG tablet Take 2 tablets (8 mg total) by mouth daily. Start the day after carboplatin chemotherapy (day 1 of each 21 days cycle)  and continue for 2 days (days 2 and 3, counting chemotherapy day as day 1);  last dose the morning of day 4. 30 tablet 1  . filgrastim-aafi (NIVESTYM) 480 MCG/0.8ML SOSY injection Pt to self administered 480 mcg SQ on days 3,4 and 5 of weekly chemotherapy 4.8 mL 0  . lidocaine-prilocaine (EMLA) cream Apply to affected area once 30 g 3  . LORazepam (ATIVAN) 0.5 MG tablet Take 1 tablet (0.5 mg total) by mouth at bedtime as needed (Nausea or vomiting). 20 tablet 0  . prochlorperazine (COMPAZINE) 10 MG tablet Take 1 tablet (10 mg total) by mouth every 6 (six) hours as needed (Nausea or vomiting). 30 tablet 1   No current facility-administered medications for this visit.    OBJECTIVE: White woman who appears stated age   34:   10/12/19 0900  BP: 118/70  Pulse: 74  Resp: 18  Temp: 98.9 F (37.2 C)  SpO2: 100%     Body mass index is 21.99 kg/m.    Wt Readings from Last 3 Encounters:  10/12/19 144 lb 9.6 oz (65.6 kg)  10/09/19 146 lb 9.6 oz (66.5 kg)  10/02/19 141 lb 9.6 oz (64.2 kg)      ECOG FS:1 - Symptomatic but completely ambulatory  Sclerae unicteric, EOMs intact Wearing a mask No cervical or supraclavicular adenopathy Lungs no rales or rhonchi Heart regular rate and rhythm Abd soft, nontender, positive bowel sounds MSK no focal spinal tenderness, no upper extremity lymphedema Neuro: nonfocal, well oriented, appropriate affect Breasts: I do not palpate a mass Reyes either breast.  Both axillae are benign.   LAB RESULTS:      Component Value Date/Time   NA 142 10/12/2019 0833   K 4.4 10/12/2019 0833   CL 109 10/12/2019 0833   CO2 24 10/12/2019 0833   GLUCOSE 116 (H) 10/12/2019 0833   BUN 11 10/12/2019 0833   CREATININE 0.72 10/12/2019 0833   CALCIUM 10.1 10/12/2019 0833   PROT 6.9 10/12/2019 0833   ALBUMIN 4.1 10/12/2019 0833   AST 31 10/12/2019 0833   ALT 40 10/12/2019 0833   ALKPHOS 103 10/12/2019 0833   BILITOT 0.2 (L) 10/12/2019 0833   GFRNONAA >60 10/12/2019 0833   GFRAA >60 10/12/2019 0833    No results found for: TOTALPROTELP, ALBUMINELP, A1GS, A2GS, BETS, BETA2SER, GAMS, MSPIKE, SPEI  Lab Results  Component Value Date   WBC 35.7 (H) 10/12/2019   NEUTROABS 30.9 (H) 10/12/2019   HGB 8.6 (L) 10/12/2019   HCT 25.8 (L) 10/12/2019   MCV 110.7 (H) 10/12/2019   PLT 96 (L) 10/12/2019    No results found for: LABCA2  No components found for: CHENID782  No results for input(s): INR Reyes the last 168 hours.  No results found for: LABCA2  No results found for: UMP536  No results found for: RWE315  No results found for: QMG867  No results found for: CA2729  No components found for: HGQUANT  No results found for: CEA1 / No results found for: CEA1   No results found for: AFPTUMOR  No results found for: CHROMOGRNA  No results found for: KPAFRELGTCHN, LAMBDASER, KAPLAMBRATIO (kappa/lambda  light chains)  No results found for: HGBA, HGBA2QUANT, HGBFQUANT, HGBSQUAN (Hemoglobinopathy evaluation)   No results found for: LDH  No results found for: IRON, TIBC, IRONPCTSAT (Iron and TIBC)  No results found for: FERRITIN  Urinalysis No results found for: COLORURINE, APPEARANCEUR, LABSPEC, PHURINE, GLUCOSEU, HGBUR, BILIRUBINUR, KETONESUR, PROTEINUR, UROBILINOGEN, NITRITE, LEUKOCYTESUR   STUDIES: MR BREAST  BILATERAL W WO CONTRAST INC CAD  Result Date: 10/03/2019 CLINICAL DATA:  34 year old patient with BRCA 1 mutation and recent diagnosis of triple negative LEFT breast cancer. She also has a history of right breast cancer, diagnosed Reyes 2012. She is receiving neoadjuvant chemotherapy currently and presents for follow-up breast MRI to assess her response to treatment. She is currently planning bilateral mastectomies, likely subcutaneous. A left axillary lymph node has been biopsied under ultrasound guidance twice, at two different institutions, with nondiagnostic pathology. The patient was scheduled for a right breast MRI guided biopsy Reyes May of 2021, but there was no persistent enhancing mass or suspicious enhancement to target for biopsy and therefore no biopsy was performed. LABS:  None obtained EXAM: BILATERAL BREAST MRI WITH AND WITHOUT CONTRAST TECHNIQUE: Multiplanar, multisequence MR images of both breasts were obtained prior to and following the intravenous administration of 5 ml of Gadavist Three-dimensional MR images were rendered by post-processing of the original MR data on an independent workstation. The three-dimensional MR images were interpreted, and findings are reported Reyes the following complete MRI report for this study. Three dimensional images were evaluated at the independent DynaCad workstation COMPARISON:  Breast MRI Jul 06, 2019 and bilateral mammogram June 14, 2019. FINDINGS: Breast composition: c. Heterogeneous fibroglandular tissue. Background parenchymal  enhancement: Mild Right breast: No mass or abnormal enhancement. Artifact from 2 biopsy clips noted Reyes the retroareolar right breast. Subtle postsurgical changes Reyes the medial right breast. Decreased background parenchymal enhancement of the right breast compared to the breast MRI of Jul 06, 2019. No suspicious areas of enhancement identified Reyes the right breast to suggest malignancy. Left breast: Biopsy clip artifact Reyes the upper slightly outer left breast at the site of the previously biopsied malignancy. Significant positive response to interval neoadjuvant therapy. No residual mass is identified Reyes the superior left breast. Mild nonmass enhancement slightly superior to the biopsy clip artifact Reyes the upper-outer quadrant of the left breast noted, spanning a maximum of 9 mm. On the pretreatment MRI performed Jul 06, 2019, the known malignancy Reyes the upper-outer left breast measured 2.8 x 3.0 x 2.7 cm. Negative for skin thickening. Lymph nodes: Stable appearance of axillary lymph nodes. A left axillary lymph node with an adjacent biopsy clip artifact measures approximately 6 mm greatest diameter and appears stable. Negative for internal mammary chain lymphadenopathy. Ancillary findings:  None. IMPRESSION: 1. Significant positive response to interval neoadjuvant therapy. No residual mass is identified Reyes the superior left breast. Mild nonmass enhancement (9 mm) slightly superior to the biopsy clip artifact Reyes the upper-outer quadrant of the left breast is noted. 2. Stable appearance of the left axillary lymph nodes. 3. No MRI evidence of malignancy Reyes the right breast. RECOMMENDATION: Continue treatment planning. BI-RADS CATEGORY  6: Known biopsy-proven malignancy. Electronically Signed   By: Curlene Dolphin M.D.   On: 10/03/2019 12:31    ELIGIBLE FOR AVAILABLE RESEARCH PROTOCOL: no  ASSESSMENT: 34 y.o. BRCA1 positive [facility] woman  (0) BRCA1 positivity:  (a) s/p bilateral salpingectomy  (b) pelvic  ultrasound including transvaginal study 06/01/2019 unremarkable, as was CA 125  (c) planning on bilateral mastectomies  (d) bilateral salpingectomy 11/28/2013, age 65 (no oophorectomy)  RIGHT BREAST CANCER (1) Status post right lumpectomy 2012 for a [        ]  (a) received neoadjuvant chemotherapy consisting of 4 cycles of doxorubicin and cyclophosphamide, followed by paclitaxel x1 (developed anaphylaxis with the first dose), then 4 doses of docetaxel completed August 2012  (  b) [no adjuvant radiation]  LEFT BREAST CANCER (2) Status post left breast upper outer quadrant biopsy 06/19/2019 for a clinical T2 N1, stage IIIB invasive ductal carcinoma, grade 3, triple negative  (a) left axillary lymph node biopsy 07/12/2019 nondiagnostic [discordant]  (b) chest abdomen and pelvis CT scan 07/05/2019 shows small left axillary lymph nodes, 1.5 cm enhancement Reyes the left breast, no internal mammary adenopathy, no evidence of metastatic disease  (c) bone scan 07/13/2019 shows no evidence of bony metastatic disease  (d) biopsy of RIGHT breast mass 07/18/2019 canceled as bilateral mastectomies plan  (3) neoadjuvant chemotherapy consisting of carboplatin day 1, Abraxane days 1,8,15, repeated Q21 days x 4 cycles, starting 07/10/2019, last dose 09/25/2019  (a) final 2 doses of Abraxane omitted secondary to cytopenias and mild neuropathy  (4) definitive surgery to follow: considering nipple sparing mastectomies  (5) adjuvant radiation as appropriate  (6) consider adjuvant OLAPARIB   PLAN: Joy Reyes is done with her neoadjuvant chemotherapy.  The MRI shows what looks like a complete pathologic response--the small area of non-mass-like enhancement may be artifactual or may be DCIS--and she understands that this correlates well with pathology 90% of the time.  We discussed the fact that adding the last 2 doses of Abraxane might worsen the neuropathy, which is currently very minimal and hopefully will resolve.   I do not think that it will significantly affect her risk of recurrence to omit those 2 doses and that is what we are doing.  Accordingly she is ready to proceed to surgery.  She is still trying to make up her mind as to what kind of mastectomy she would prefer.  She will continue to discuss this with her surgeons.  Today we discussed olaparib and she has been doing some reading on it and is a little bit reluctant to consider it.  We will go over this Reyes much more detail and I will give her a copy of the specific study for her to read out when she returns to see me postop  Total encounter time 35 minutes.Sarajane Jews C. Cristoval Teall, Reyes 10/13/19 11:09 AM Medical Oncology and Hematology [facility] [place], [place] [address] Tel. [phone]    Fax. [phone]   I, Wilburn Mylar, am acting as scribe for Dr. Virgie Reyes. Ozie Dimaria.  I, Lurline Del Reyes, have reviewed the above documentation for accuracy and completeness, and I agree with the above.    *Total Encounter Time as defined by the Centers for Medicare and Medicaid Services includes, Reyes addition to the face-to-face time of a patient visit (documented Reyes the note above) non-face-to-face time: obtaining and reviewing outside history, ordering and reviewing medications, tests or procedures, care coordination (communications with other health care professionals or caregivers) and documentation Reyes the medical record.

## 2019-10-12 ENCOUNTER — Ambulatory Visit: Payer: Commercial Managed Care - PPO

## 2019-10-12 ENCOUNTER — Inpatient Hospital Stay (HOSPITAL_BASED_OUTPATIENT_CLINIC_OR_DEPARTMENT_OTHER): Payer: Commercial Managed Care - PPO | Admitting: Oncology

## 2019-10-12 ENCOUNTER — Other Ambulatory Visit: Payer: Self-pay

## 2019-10-12 ENCOUNTER — Inpatient Hospital Stay: Payer: Commercial Managed Care - PPO

## 2019-10-12 VITALS — BP 118/70 | HR 74 | Temp 98.9°F | Resp 18 | Ht 68.0 in | Wt 144.6 lb

## 2019-10-12 DIAGNOSIS — Z1501 Genetic susceptibility to malignant neoplasm of breast: Secondary | ICD-10-CM

## 2019-10-12 DIAGNOSIS — C50412 Malignant neoplasm of upper-outer quadrant of left female breast: Secondary | ICD-10-CM | POA: Diagnosis not present

## 2019-10-12 DIAGNOSIS — C50911 Malignant neoplasm of unspecified site of right female breast: Secondary | ICD-10-CM

## 2019-10-12 DIAGNOSIS — C50211 Malignant neoplasm of upper-inner quadrant of right female breast: Secondary | ICD-10-CM

## 2019-10-12 DIAGNOSIS — Z171 Estrogen receptor negative status [ER-]: Secondary | ICD-10-CM | POA: Diagnosis not present

## 2019-10-12 DIAGNOSIS — Z5111 Encounter for antineoplastic chemotherapy: Secondary | ICD-10-CM | POA: Diagnosis not present

## 2019-10-12 DIAGNOSIS — Z95828 Presence of other vascular implants and grafts: Secondary | ICD-10-CM

## 2019-10-12 LAB — CBC WITH DIFFERENTIAL/PLATELET
Abs Immature Granulocytes: 2.11 10*3/uL — ABNORMAL HIGH (ref 0.00–0.07)
Basophils Absolute: 0.2 10*3/uL — ABNORMAL HIGH (ref 0.0–0.1)
Basophils Relative: 0 %
Eosinophils Absolute: 0.1 10*3/uL (ref 0.0–0.5)
Eosinophils Relative: 0 %
HCT: 25.8 % — ABNORMAL LOW (ref 36.0–46.0)
Hemoglobin: 8.6 g/dL — ABNORMAL LOW (ref 12.0–15.0)
Immature Granulocytes: 6 %
Lymphocytes Relative: 5 %
Lymphs Abs: 1.8 10*3/uL (ref 0.7–4.0)
MCH: 36.9 pg — ABNORMAL HIGH (ref 26.0–34.0)
MCHC: 33.3 g/dL (ref 30.0–36.0)
MCV: 110.7 fL — ABNORMAL HIGH (ref 80.0–100.0)
Monocytes Absolute: 0.7 10*3/uL (ref 0.1–1.0)
Monocytes Relative: 2 %
Neutro Abs: 30.9 10*3/uL — ABNORMAL HIGH (ref 1.7–7.7)
Neutrophils Relative %: 87 %
Platelets: 96 10*3/uL — ABNORMAL LOW (ref 150–400)
RBC: 2.33 MIL/uL — ABNORMAL LOW (ref 3.87–5.11)
RDW: 20.9 % — ABNORMAL HIGH (ref 11.5–15.5)
WBC: 35.7 10*3/uL — ABNORMAL HIGH (ref 4.0–10.5)
nRBC: 0 % (ref 0.0–0.2)

## 2019-10-12 LAB — COMPREHENSIVE METABOLIC PANEL
ALT: 40 U/L (ref 0–44)
AST: 31 U/L (ref 15–41)
Albumin: 4.1 g/dL (ref 3.5–5.0)
Alkaline Phosphatase: 103 U/L (ref 38–126)
Anion gap: 9 (ref 5–15)
BUN: 11 mg/dL (ref 6–20)
CO2: 24 mmol/L (ref 22–32)
Calcium: 10.1 mg/dL (ref 8.9–10.3)
Chloride: 109 mmol/L (ref 98–111)
Creatinine, Ser: 0.72 mg/dL (ref 0.44–1.00)
GFR calc Af Amer: >60 mL/min (ref >60)
GFR calc non Af Amer: >60 mL/min (ref >60)
Glucose, Bld: 116 mg/dL — ABNORMAL HIGH (ref 70–99)
Potassium: 4.4 mmol/L (ref 3.5–5.1)
Sodium: 142 mmol/L (ref 135–145)
Total Bilirubin: 0.2 mg/dL — ABNORMAL LOW (ref 0.3–1.2)
Total Protein: 6.9 g/dL (ref 6.5–8.1)

## 2019-10-12 MED ORDER — SODIUM CHLORIDE 0.9% FLUSH
10.0000 mL | Freq: Once | INTRAVENOUS | Status: AC
  Administered 2019-10-12: 10 mL
  Filled 2019-10-12: qty 10

## 2019-10-12 MED ORDER — HEPARIN SOD (PORK) LOCK FLUSH 100 UNIT/ML IV SOLN
500.0000 [IU] | Freq: Once | INTRAVENOUS | Status: AC
  Administered 2019-10-12: 500 [IU]
  Filled 2019-10-12: qty 5

## 2019-10-12 NOTE — Patient Instructions (Signed)

## 2019-10-15 ENCOUNTER — Telehealth: Payer: Self-pay | Admitting: Oncology

## 2019-10-15 NOTE — Telephone Encounter (Signed)
Appts needed for 8/20 los were already made from 8/3 los. Made no changes to pt's schedule.

## 2019-10-16 ENCOUNTER — Inpatient Hospital Stay: Payer: Commercial Managed Care - PPO

## 2019-10-16 ENCOUNTER — Inpatient Hospital Stay: Payer: Commercial Managed Care - PPO | Admitting: Nurse Practitioner

## 2019-10-16 ENCOUNTER — Telehealth: Payer: Self-pay | Admitting: Nurse Practitioner

## 2019-10-16 ENCOUNTER — Encounter: Payer: Self-pay | Admitting: *Deleted

## 2019-10-16 NOTE — Telephone Encounter (Signed)
Cancelled appt today 8/24 per LB. Called pt a few times, no answer and no voicemail set up.

## 2019-11-02 ENCOUNTER — Ambulatory Visit: Payer: Commercial Managed Care - PPO

## 2019-11-09 ENCOUNTER — Other Ambulatory Visit: Payer: Self-pay

## 2019-11-09 ENCOUNTER — Inpatient Hospital Stay: Payer: Commercial Managed Care - PPO

## 2019-11-09 ENCOUNTER — Inpatient Hospital Stay: Payer: Commercial Managed Care - PPO | Attending: Oncology | Admitting: Oncology

## 2019-11-09 ENCOUNTER — Telehealth: Payer: Self-pay | Admitting: Oncology

## 2019-11-09 DIAGNOSIS — Z171 Estrogen receptor negative status [ER-]: Secondary | ICD-10-CM | POA: Insufficient documentation

## 2019-11-09 DIAGNOSIS — R5383 Other fatigue: Secondary | ICD-10-CM | POA: Diagnosis not present

## 2019-11-09 DIAGNOSIS — Z9079 Acquired absence of other genital organ(s): Secondary | ICD-10-CM | POA: Diagnosis not present

## 2019-11-09 DIAGNOSIS — G620 Drug-induced polyneuropathy: Secondary | ICD-10-CM | POA: Insufficient documentation

## 2019-11-09 DIAGNOSIS — Z1501 Genetic susceptibility to malignant neoplasm of breast: Secondary | ICD-10-CM

## 2019-11-09 DIAGNOSIS — C50211 Malignant neoplasm of upper-inner quadrant of right female breast: Secondary | ICD-10-CM | POA: Diagnosis not present

## 2019-11-09 DIAGNOSIS — Z79899 Other long term (current) drug therapy: Secondary | ICD-10-CM | POA: Diagnosis not present

## 2019-11-09 DIAGNOSIS — Z818 Family history of other mental and behavioral disorders: Secondary | ICD-10-CM | POA: Diagnosis not present

## 2019-11-09 DIAGNOSIS — C50911 Malignant neoplasm of unspecified site of right female breast: Secondary | ICD-10-CM | POA: Diagnosis not present

## 2019-11-09 DIAGNOSIS — C50412 Malignant neoplasm of upper-outer quadrant of left female breast: Secondary | ICD-10-CM | POA: Diagnosis not present

## 2019-11-09 DIAGNOSIS — Z9013 Acquired absence of bilateral breasts and nipples: Secondary | ICD-10-CM | POA: Diagnosis not present

## 2019-11-09 DIAGNOSIS — T451X5A Adverse effect of antineoplastic and immunosuppressive drugs, initial encounter: Secondary | ICD-10-CM | POA: Insufficient documentation

## 2019-11-09 DIAGNOSIS — Z803 Family history of malignant neoplasm of breast: Secondary | ICD-10-CM | POA: Diagnosis not present

## 2019-11-09 LAB — CBC WITH DIFFERENTIAL/PLATELET
Abs Immature Granulocytes: 0.01 10*3/uL (ref 0.00–0.07)
Basophils Absolute: 0 10*3/uL (ref 0.0–0.1)
Basophils Relative: 1 %
Eosinophils Absolute: 0.2 10*3/uL (ref 0.0–0.5)
Eosinophils Relative: 7 %
HCT: 34.7 % — ABNORMAL LOW (ref 36.0–46.0)
Hemoglobin: 11.4 g/dL — ABNORMAL LOW (ref 12.0–15.0)
Immature Granulocytes: 0 %
Lymphocytes Relative: 32 %
Lymphs Abs: 0.9 10*3/uL (ref 0.7–4.0)
MCH: 35.8 pg — ABNORMAL HIGH (ref 26.0–34.0)
MCHC: 32.9 g/dL (ref 30.0–36.0)
MCV: 109.1 fL — ABNORMAL HIGH (ref 80.0–100.0)
Monocytes Absolute: 0.3 10*3/uL (ref 0.1–1.0)
Monocytes Relative: 10 %
Neutro Abs: 1.4 10*3/uL — ABNORMAL LOW (ref 1.7–7.7)
Neutrophils Relative %: 50 %
Platelets: 168 10*3/uL (ref 150–400)
RBC: 3.18 MIL/uL — ABNORMAL LOW (ref 3.87–5.11)
RDW: 11.9 % (ref 11.5–15.5)
WBC: 2.9 10*3/uL — ABNORMAL LOW (ref 4.0–10.5)
nRBC: 0 % (ref 0.0–0.2)

## 2019-11-09 LAB — COMPREHENSIVE METABOLIC PANEL
ALT: 34 U/L (ref 0–44)
AST: 30 U/L (ref 15–41)
Albumin: 4.3 g/dL (ref 3.5–5.0)
Alkaline Phosphatase: 37 U/L — ABNORMAL LOW (ref 38–126)
Anion gap: 8 (ref 5–15)
BUN: 10 mg/dL (ref 6–20)
CO2: 24 mmol/L (ref 22–32)
Calcium: 9.9 mg/dL (ref 8.9–10.3)
Chloride: 109 mmol/L (ref 98–111)
Creatinine, Ser: 0.77 mg/dL (ref 0.44–1.00)
GFR calc Af Amer: >60 mL/min (ref >60)
GFR calc non Af Amer: >60 mL/min (ref >60)
Glucose, Bld: 105 mg/dL — ABNORMAL HIGH (ref 70–99)
Potassium: 5.2 mmol/L — ABNORMAL HIGH (ref 3.5–5.1)
Sodium: 141 mmol/L (ref 135–145)
Total Bilirubin: 0.4 mg/dL (ref 0.3–1.2)
Total Protein: 7.2 g/dL (ref 6.5–8.1)

## 2019-11-09 NOTE — Telephone Encounter (Signed)
Scheduled appts per 9/17 los. Pt stated she would refer to mychart for AVS and next appt details.

## 2019-11-09 NOTE — Progress Notes (Signed)
[place]  Telephone:([phone]) [phone] Fax:([phone]) [phone]     ID: Joy Reyes DOB: 03/20/1985  MR#: [id number]  GYB#:[id number]  Patient Care Team: Pcp, No as PCP - General Amit Meloy, [name], MD as Consulting Physician (Oncology) [name], RN as Oncology Nurse Navigator [name], RN as Oncology Nurse Navigator [name], MD as Consulting Physician (Plastic Surgery) [name], MD as Consulting Physician (General Surgery) [name], MD OTHER MD: PCP= [name] MD  CHIEF COMPLAINT: BRCA positive breast cancer  CURRENT TREATMENT: Awaiting definitive surgery   INTERVAL HISTORY: Joy Reyes" returns today for follow up and treatment of her BRCA positive breast cancer.  She completed her chemotherapy 09/25/2019.  Her final 2 doses of Abraxane were canceled secondary to grade 1 neuropathy.  That thankfully has completely resolved.  Posttreatment breast MRI 10/03/2019 showed no residual mass in the left breast, mild non-mass enhancement (0.9 cm) superior to the biopsy clip in the upper outer quadrant, stable left axillary lymph nodes and no evidence of malignancy in the right breast.  She is scheduled for bilateral mastectomies and left axillary sentinel lymph node biopsy on 11/27/2019 under Dr. [name].  She has pretty much decided against a nipple sparing mastectomies and will eventually have her nipples tattooed.  REVIEW OF SYSTEMS: Joy Reyes describes herself as still a bit fatigued although she is able to do pretty much all her normal activities.  She tells me she sleeps okay.  And she is exercising regularly.  She still has some of her original hair but it is all rather patchy and she is planning to have the whole scalp buzzed close so that when it grows back it will grow normally.  She has gotten her eyebrows and eyelashes back.  She tells me her husband and children have weathered her treatment well and she is looking forward to  eventually getting back to work full-time herself.    HISTORY OF CURRENT ILLNESS: From the original intake note:  Joy Reyes has a history of breast cancer, diagnosed in 2012 while she lived in [place], [place], and treated in [place] under Dr [name].  We are obtaining data from that experience but according to the patient her tumor was the size of an apricot.  She did not have axillary lymph node sampling or radiation.  She remembers 8 neoadjuvant chemotherapy treatments given every 2 weeks with the first including the "red drug", which suggest Cytoxan and Adriamycin in dose dense fashion x4 most likely followed by either Taxol or Taxotere in dose dense fashion x4.  After that experience and given her breast density and BRCA mutation our practice here would have been a yearly follow-up with MRI in addition to yearly mammography.   More recently Joy Reyes herself palpated a change in her right breast.  She brought it to Dr. [name] attention and underwent bilateral diagnostic mammography with tomography and left breast ultrasonography at [facility] on 06/14/2019 showing: breast density category D; there was an area of asymmetry in the upper outer quadrant of the left breast, and ultrasound of that area showed a 2.8 cm mass.  Ultrasound of the axilla showed 1.8 cm lymph node with cortical thickening.    In the right breast they saw only postsurgical changes, with 2 biopsy clips in the retroareolar area.  Accordingly on 06/19/2019 she proceeded to biopsy of the left breast area in question. 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  The left axillary lymph node biopsy was nondiagnostic, with very spare sampling of fibroadipose and lymphoid tissue.  Estrogen receptor was negative, with less than 1% uptake, progesterone receptor was negative, with less than 1% uptake, HER-2 was negative by immunohistochemistry with a  score of 1+.  The patient's subsequent history is as detailed below.   PAST MEDICAL HISTORY: Past Medical History:  Diagnosis Date  . Cancer [facility])    breast cancer 2012    PAST SURGICAL HISTORY: Past Surgical History:  Procedure Laterality Date  . BREAST SURGERY     lumpectomy on right breast  . CESAREAN SECTION MULTI-GESTATIONAL N/A 06/01/2015   Procedure: CESAREAN SECTION MULTI-GESTATIONAL;  Surgeon: Waymon Amato, MD;  Location: [facility];  Service: Obstetrics;  Laterality: N/A;  . IR IMAGING GUIDED PORT INSERTION  07/05/2019  . LOBECTOMY Right 2012  . SALPINGECTOMY      FAMILY HISTORY: Family History  Problem Relation Age of Onset  . Breast cancer Maternal Grandmother        very late in life, lived to 65  . Breast cancer Paternal Grandmother        died at age 7  . Alzheimer's disease Paternal Grandfather     The patient's parents are both turning 90 in 2021.  The paternal grandfather died from Alzheimer's disease.  The paternal grandmother died from breast cancer at the age of 74.  The patient's father had 1 sister who had died as a child from meningitis.  He had no brothers.  On the mother side, a maternal grandmother did have breast cancer very late in life, living to be 65.  The paternal grandfather and the only maternal aunt had no history of cancer.  The patient has 1 brother, in good health, no sisters.   GYNECOLOGIC HISTORY:  No LMP recorded. Menarche: 34 years old Age at first live birth: 34 years old Opal P twins LMP regular as of May 2021 Contraceptive HRT no  Hysterectomy?  No BSO?  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  Her husband Joy Reyes") works for an NGO (the [employer]) out of [place] which helps small agencies doing research set up and succeed.  He wrote a book, [employer], which sounds very interesting and he writes regularly for the [employer] as well.  Their twins are     years old. [name] has (2?) children from a prior marriage. The patient belongs to an orthodox denomination    ADVANCED DIRECTIVES: In the absence of any documentation to the contrary, the patient's spouse is their HCPOA.    HEALTH MAINTENANCE: Social History   Tobacco Use  . Smoking status: Never Smoker  . Smokeless tobacco: Never Used  Substance Use Topics  . Alcohol use: No  . Drug use: No     Colonoscopy: n/a (age)  PAP: Up-to-date  Bone density: n/a (age)   Allergies  Allergen Reactions  . Paclitaxel     Anaphylactic per pt history in MD note    Current Outpatient Medications  Medication Sig Dispense Refill  . dexamethasone (DECADRON) 4 MG tablet Take 2 tablets (8 mg total) by mouth daily. Start the day after carboplatin chemotherapy (day 1 of each 21 days cycle) and continue for 2 days (days 2 and 3, counting chemotherapy day as day 1);  last dose the morning of day 4. (Patient not taking: Reported on  11/08/2019) 30 tablet 1  . filgrastim-aafi (NIVESTYM) 480 MCG/0.8ML SOSY injection Pt to self administered 480 mcg SQ on days 3,4 and 5 of weekly chemotherapy (Patient not taking: Reported on 11/08/2019) 4.8 mL 0  . lidocaine-prilocaine (EMLA) cream Apply to affected area once (Patient not taking: Reported on 11/08/2019) 30 g 3  . LORazepam (ATIVAN) 0.5 MG tablet Take 1 tablet (0.5 mg total) by mouth at bedtime as needed (Nausea or vomiting). (Patient not taking: Reported on 11/08/2019) 20 tablet 0  . prochlorperazine (COMPAZINE) 10 MG tablet Take 1 tablet (10 mg total) by mouth every 6 (six) hours as needed (Nausea or vomiting). (Patient not taking: Reported on 11/08/2019) 30 tablet 1   No current facility-administered medications for this visit.    OBJECTIVE: White woman in no acute distress  There were no vitals filed for this visit.   There is no height or weight on file to calculate BMI.   Wt Readings from Last 3 Encounters:   10/12/19 144 lb 9.6 oz (65.6 kg)  10/09/19 146 lb 9.6 oz (66.5 kg)  10/02/19 141 lb 9.6 oz (64.2 kg)      ECOG FS:1 - Symptomatic but completely ambulatory  Sclerae unicteric, EOMs intact Wearing a mask No cervical or supraclavicular adenopathy Lungs no rales or rhonchi Heart regular rate and rhythm Abd soft, nontender, positive bowel sounds MSK no focal spinal tenderness, no upper extremity lymphedema Neuro: nonfocal, well oriented, appropriate affect Breasts: The right breast is status post prior lumpectomy.  There is no evidence of local recurrence.  The left breast is status post prior biopsy.  I do not palpate a mass.  Both axillae are benign.  LAB RESULTS:      Component Value Date/Time   NA 141 11/09/2019 1022   K 5.2 (H) 11/09/2019 1022   CL 109 11/09/2019 1022   CO2 24 11/09/2019 1022   GLUCOSE 105 (H) 11/09/2019 1022   BUN 10 11/09/2019 1022   CREATININE 0.77 11/09/2019 1022   CALCIUM 9.9 11/09/2019 1022   PROT 7.2 11/09/2019 1022   ALBUMIN 4.3 11/09/2019 1022   AST 30 11/09/2019 1022   ALT 34 11/09/2019 1022   ALKPHOS 37 (L) 11/09/2019 1022   BILITOT 0.4 11/09/2019 1022   GFRNONAA >60 11/09/2019 1022   GFRAA >60 11/09/2019 1022    No results found for: TOTALPROTELP, ALBUMINELP, A1GS, A2GS, BETS, BETA2SER, GAMS, MSPIKE, SPEI  Lab Results  Component Value Date   WBC 2.9 (L) 11/09/2019   NEUTROABS 1.4 (L) 11/09/2019   HGB 11.4 (L) 11/09/2019   HCT 34.7 (L) 11/09/2019   MCV 109.1 (H) 11/09/2019   PLT 168 11/09/2019    No results found for: LABCA2  No components found for: NTIRWE315  No results for input(s): INR in the last 168 hours.  No results found for: LABCA2  No results found for: QMG867  No results found for: YPP509  No results found for: TOI712  No results found for: CA2729  No components found for: HGQUANT  No results found for: CEA1 / No results found for: CEA1   No results found for: AFPTUMOR  No results found for:  CHROMOGRNA  No results found for: KPAFRELGTCHN, LAMBDASER, KAPLAMBRATIO (kappa/lambda light chains)  No results found for: HGBA, HGBA2QUANT, HGBFQUANT, HGBSQUAN (Hemoglobinopathy evaluation)   No results found for: LDH  No results found for: IRON, TIBC, IRONPCTSAT (Iron and TIBC)  No results found for: FERRITIN  Urinalysis No results found for: COLORURINE, APPEARANCEUR, [place], [place], GLUCOSEU, [place],  BILIRUBINUR, KETONESUR, PROTEINUR, UROBILINOGEN, NITRITE, LEUKOCYTESUR   STUDIES: No results found.  ELIGIBLE FOR AVAILABLE RESEARCH PROTOCOL: no  ASSESSMENT: 34 y.o. BRCA1 positive [facility] woman  (0) BRCA1 positivity:  (a) s/p bilateral salpingectomy  (b) pelvic ultrasound including transvaginal study 06/01/2019 unremarkable, as was CA 125  (c) planning on bilateral mastectomies  (d) bilateral salpingectomy 11/28/2013, age 75 (no oophorectomy)  RIGHT BREAST CANCER (1) Status post right lumpectomy 2012 for a [        ]  (a) received neoadjuvant chemotherapy consisting of 4 cycles of doxorubicin and cyclophosphamide, followed by paclitaxel x1 (developed anaphylaxis with the first dose), then 4 doses of docetaxel completed August 2012  (b) [no adjuvant radiation]  LEFT BREAST CANCER (2) Status post left breast upper outer quadrant biopsy 06/19/2019 for a clinical T2 N1, stage IIIB invasive ductal carcinoma, grade 3, triple negative  (a) left axillary lymph node biopsy 07/12/2019 nondiagnostic [discordant]  (b) chest abdomen and pelvis CT scan 07/05/2019 shows small left axillary lymph nodes, 1.5 cm enhancement in the left breast, no internal mammary adenopathy, no evidence of metastatic disease  (c) bone scan 07/13/2019 shows no evidence of bony metastatic disease  (d) biopsy of RIGHT breast mass 07/18/2019 canceled as bilateral mastectomies plan  (3) neoadjuvant chemotherapy consisting of carboplatin day 1, Abraxane days 1,8,15, repeated Q21 days x 4 cycles, starting  07/10/2019, last dose 09/25/2019  (a) final 2 doses of Abraxane omitted secondary to cytopenias and mild neuropathy  (4) definitive surgery 11/27/2019  (5) adjuvant radiation as appropriate  (6) to start OLAPARIB postop, to be continued for 1 year   PLAN: Joy Reyes has recovered remarkably well from her chemotherapy treatments and is ready for surgery in about 2 weeks.  We reviewed the MRI images as well as report today and I am hopeful she will not need any post mastectomy radiation.  She will benefit from olaparib.  We discussed the documented benefits, as well as the possible side effects toxicities and complications of this agent and the plan will be to start once she is fully recovered from radiation.  She is willing to do this for a year assuming no significant side effects or complications.  I am going to go ahead and place the order for that so we can get over any hurdles that insurance might place but she will not start until the second week in November which is when she will return to see me.  Total encounter time 35 minutes.*  Olaparib for 1 year at 300 mg twice daily significantly increased (by about 8%) 3-year distant disease-free survival; prolonged side effects aside from cytopenia included nausea and fatigue Joy Reyes is done with her neoadjuvant chemotherapy.  The MRI shows what looks like a complete pathologic response--the small area of non-mass-like enhancement may be artifactual or may be DCIS--and she understands that this correlates well with pathology 90% of the time.  We discussed the fact that adding the last 2 doses of Abraxane might worsen the neuropathy, which is currently very minimal and hopefully will resolve.  I do not think that it will significantly affect her risk of recurrence to omit those 2 doses and that is what we are doing.  Accordingly she is ready to proceed to surgery.  She is still trying to make up her mind as to what kind of mastectomy she would  prefer.  She will continue to discuss this with her surgeons.  Today we discussed olaparib and she has been doing some  reading on it and is a little bit reluctant to consider it.  We will go over this in much more detail and I will give her a copy of the specific study for her to read out when she returns to see me postop  Total encounter time 35 minutes.Sarajane Jews C. Lauraann Missey, MD 11/10/19 8:35 AM Medical Oncology and Hematology [facility] [place], [place] [address] Tel. [phone]    Fax. [phone]   I, Wilburn Mylar, am acting as scribe for Dr. [name]. Javius Sylla.  I, Lurline Del MD, have reviewed the above documentation for accuracy and completeness, and I agree with the above.    *Total Encounter Time as defined by the Centers for Medicare and Medicaid Services includes, in addition to the face-to-face time of a patient visit (documented in the note above) non-face-to-face time: obtaining and reviewing outside history, ordering and reviewing medications, tests or procedures, care coordination (communications with other health care professionals or caregivers) and documentation in the medical record.

## 2019-11-10 MED ORDER — OLAPARIB 150 MG PO TABS: 300.0000 mg | ORAL_TABLET | Freq: Two times a day (BID) | ORAL | 6 refills | Status: DC

## 2019-11-12 ENCOUNTER — Telehealth: Payer: Self-pay

## 2019-11-12 ENCOUNTER — Telehealth: Payer: Self-pay | Admitting: Pharmacist

## 2019-11-12 DIAGNOSIS — C50911 Malignant neoplasm of unspecified site of right female breast: Secondary | ICD-10-CM

## 2019-11-12 DIAGNOSIS — Z1501 Genetic susceptibility to malignant neoplasm of breast: Secondary | ICD-10-CM

## 2019-11-12 MED ORDER — OLAPARIB 150 MG PO TABS: 300.0000 mg | ORAL_TABLET | Freq: Two times a day (BID) | ORAL | 6 refills | Status: DC

## 2019-11-12 NOTE — Telephone Encounter (Signed)
Oral Oncology Patient Advocate Encounter  Prior Authorization for Lonie Peak has been approved.    PA# [id number] Effective dates: 11/12/19 through 11/11/20  Patient must fill at [facility] will continue to follow.   [place] Patient [place] Phone [phone] Fax [phone] 11/12/2019 11:19 AM

## 2019-11-12 NOTE — Telephone Encounter (Signed)
Oral Oncology Pharmacist Encounter  Received new prescription for Lynparza (olaparib) for the treatment of BRCA-1 positive, triple negative stage IIIB breast cancer, planned duration 1 year.  Prescription dose and frequency assessed for appropriateness. Appropriate for therapy initiation. Plan for patient to start after surgery. Per MD plan is to start in early November.   CBC w/ Diff and CMP from 11/09/19 assessed, noted patient slightly neutropenic - all other labs stable for treatment initiation  Current medication list in Epic reviewed, no relevant/significant DDIs with Lonie Peak identified.  Evaluated chart and no patient barriers to medication adherence noted.   Prescription has been e-scribed to the [facility] for benefits analysis and approval.  Oral Oncology Clinic will continue to follow for insurance authorization, copayment issues, initial counseling and start date.  Leron Croak, PharmD, BCPS Hematology/Oncology Clinical Pharmacist [facility] [phone] 11/12/2019 8:43 AM

## 2019-11-12 NOTE — Telephone Encounter (Signed)
Oral Oncology Pharmacist Encounter  Patient's insurance requires that Lonie Peak be filled through [facility]. Prescription redirected to their dispensing pharmacy.  Oral Oncology Clinic will continue to follow.  Leron Croak, PharmD, BCPS Hematology/Oncology Clinical Pharmacist [facility] [phone] 11/12/2019 11:21 AM

## 2019-11-12 NOTE — Telephone Encounter (Signed)
Oral Oncology Patient Advocate Encounter  Received notification from [facility] that prior authorization for Joy Reyes is required.  PA submitted on CoverMyMeds Key [id number] Status is pending  Oral Oncology Clinic will continue to follow.  [place] Patient [place] Phone [phone] Fax [phone] 11/12/2019 8:43 AM

## 2019-11-15 ENCOUNTER — Ambulatory Visit: Admit: 2019-11-15 | Payer: Commercial Managed Care - PPO | Admitting: Surgery

## 2019-11-15 SURGERY — MASTECTOMY WITH SENTINEL LYMPH NODE BIOPSY
Anesthesia: General | Site: Breast | Laterality: Left

## 2019-11-16 NOTE — Telephone Encounter (Addendum)
Oral Chemotherapy Pharmacist Encounter  Spoke with patient offering initial counseling session for Lynparza (olaparaib). Since patient will not be starting therapy until ~01/02/20, she requested we discuss specifics of medication closer to when she will be starting [place] I will reach back out to Ms. Sikes in late October, at her request, to further discuss the medication.  Patient's insurance requires that Lonie Peak is filled through [facility]. Medication has been sent and processed by their pharmacy. Ms. Semel informed me that she spoke to a representative for their dispensing pharmacy on 11/16/19 and informed them to hold off on sending the medication until she is closer to starting treatment.  Oral Oncology Clinic will continue to follow.   Leron Croak, PharmD, BCPS Hematology/Oncology Clinical Pharmacist [facility] [phone] 11/16/2019 4:08 PM

## 2019-11-16 NOTE — Progress Notes (Signed)
[id number] [place], [place] - [address] [place] [address] Phone: [phone] Fax: [phone]  [place], [place] [address] Phone: [phone] Fax: [phone]  [place], [place] [address] Phone: [phone] Fax: [phone]      Your procedure is scheduled on Tuesday, October 5th.  Report to [facility] Main Entrance "A" at 5:30 A.M., and check in at the Admitting office.  Call this number if you have problems the morning of surgery:  [phone]  Call [phone] if you have any questions prior to your surgery date Monday-Friday 8am-4pm    Remember:  Do not eat after midnight the night before your surgery  You may drink clear liquids until 4:30 AM the morning of your surgery.   Clear liquids allowed are: Water, Non-Citrus Juices (without pulp), Carbonated Beverages, Clear Tea, Black Coffee Only, and Gatorade    Take these medicines the morning of surgery with A SIP OF WATER    Olaparib Lonie Peak)  As of today, STOP taking any Aspirin (unless otherwise instructed by your surgeon) Aleve, Naproxen, Ibuprofen, Motrin, Advil, Goody's, BC's, all herbal medications, fish oil, and all vitamins.                      Do not wear jewelry, make up, or nail polish            Do not wear lotions, powders, perfumes, or deodorant.            Do not shave 48 hours prior to surgery.              Do not bring valuables to the hospital.            [facility] is not responsible for any belongings or valuables.  Do NOT Smoke (Tobacco/Vaping) or drink Alcohol 24 hours prior to your procedure If you use a CPAP at night, you may bring all equipment for your overnight stay.   Contacts, glasses, dentures or bridgework may not be worn into surgery.      For  patients admitted to the hospital, discharge time will be determined by your treatment team.   Patients discharged the day of surgery will not be allowed to drive home, and someone needs to stay with them for 24 hours.    Special instructions:   [place]- Preparing For Surgery  Before surgery, you can play an important role. Because skin is not sterile, your skin needs to be as free of germs as possible. You can reduce the number of germs on your skin by washing with CHG (chlorahexidine gluconate) Soap before surgery.  CHG is an antiseptic cleaner which kills germs and bonds with the skin to continue killing germs even after washing.    Oral Hygiene is also important to reduce your risk of infection.  Remember - BRUSH YOUR TEETH THE MORNING OF SURGERY WITH YOUR REGULAR TOOTHPASTE  Please do not use if you have an allergy to CHG or antibacterial soaps. If your skin becomes reddened/irritated stop using the CHG.  Do not shave (including legs and underarms) for at least 48 hours prior to first CHG shower. It is OK to shave your face.  Please follow these instructions carefully.   1. Shower the NIGHT BEFORE SURGERY and  the MORNING OF SURGERY with CHG Soap.   2. If you chose to wash your hair, wash your hair first as usual with your normal shampoo.  3. After you shampoo, rinse your hair and body thoroughly to remove the shampoo.  4. Use CHG as you would any other liquid soap. You can apply CHG directly to the skin and wash gently with a scrungie or a clean washcloth.   5. Apply the CHG Soap to your body ONLY FROM THE NECK DOWN.  Do not use on open wounds or open sores. Avoid contact with your eyes, ears, mouth and genitals (private parts). Wash Face and genitals (private parts)  with your normal soap.   6. Wash thoroughly, paying special attention to the area where your surgery will be performed.  7. Thoroughly rinse your body with warm water from the neck down.  8. DO NOT shower/wash  with your normal soap after using and rinsing off the CHG Soap.  9. Pat yourself dry with a CLEAN TOWEL.  10. Wear CLEAN PAJAMAS to bed the night before surgery  11. Place CLEAN SHEETS on your bed the night of your first shower and DO NOT SLEEP WITH PETS.   Day of Surgery: Wear Clean/Comfortable clothing the morning of surgery Do not apply any deodorants/lotions.   Remember to brush your teeth WITH YOUR REGULAR TOOTHPASTE.   Please read over the following fact sheets that you were given.

## 2019-11-19 ENCOUNTER — Encounter (HOSPITAL_COMMUNITY)
Admission: RE | Admit: 2019-11-19 | Discharge: 2019-11-19 | Disposition: A | Discharge status: Home / Self Care | Payer: Commercial Managed Care - PPO | Source: Ambulatory Visit | Attending: Surgery | Admitting: Surgery

## 2019-11-19 ENCOUNTER — Encounter (HOSPITAL_COMMUNITY): Payer: Self-pay

## 2019-11-19 ENCOUNTER — Other Ambulatory Visit: Payer: Self-pay

## 2019-11-19 DIAGNOSIS — Z01812 Encounter for preprocedural laboratory examination: Secondary | ICD-10-CM | POA: Insufficient documentation

## 2019-11-19 HISTORY — DX: Nausea with vomiting, unspecified: R11.2

## 2019-11-19 HISTORY — DX: Nausea with vomiting, unspecified: Z98.890

## 2019-11-19 LAB — CBC
HCT: 39.7 % (ref 36.0–46.0)
Hemoglobin: 12.9 g/dL (ref 12.0–15.0)
MCH: 35.3 pg — ABNORMAL HIGH (ref 26.0–34.0)
MCHC: 32.5 g/dL (ref 30.0–36.0)
MCV: 108.8 fL — ABNORMAL HIGH (ref 80.0–100.0)
Platelets: 183 10*3/uL (ref 150–400)
RBC: 3.65 MIL/uL — ABNORMAL LOW (ref 3.87–5.11)
RDW: 11.5 % (ref 11.5–15.5)
WBC: 3.6 10*3/uL — ABNORMAL LOW (ref 4.0–10.5)
nRBC: 0 % (ref 0.0–0.2)

## 2019-11-19 LAB — BASIC METABOLIC PANEL
Anion gap: 11 (ref 5–15)
BUN: 11 mg/dL (ref 6–20)
CO2: 24 mmol/L (ref 22–32)
Calcium: 10.1 mg/dL (ref 8.9–10.3)
Chloride: 104 mmol/L (ref 98–111)
Creatinine, Ser: 0.75 mg/dL (ref 0.44–1.00)
GFR calc Af Amer: >60 mL/min (ref >60)
GFR calc non Af Amer: >60 mL/min (ref >60)
Glucose, Bld: 102 mg/dL — ABNORMAL HIGH (ref 70–99)
Potassium: 4 mmol/L (ref 3.5–5.1)
Sodium: 139 mmol/L (ref 135–145)

## 2019-11-19 NOTE — H&P (Signed)
Subjective:     Patient ID: Joy Reyes is a 34 y.o. female.  HPI  Here for follow up discussion breast reconstruction prior to planned bilateral mastectomies with immediate expander based reconstruction. History of RIGHT breast cancer, diagnosed in 2012 while she lived in [place], [place], and treated in [place]. Per patient underwent neoadjuvant chemotherapy followed by lumpectomy. Denies history LN sampling or radiation. In 2021, palpated change left breast.  Diagnostic MMG showed asymmetry left UOQ. US showed a 2.8 cm mass, a 1.8 cm axillary left LN with cortical thickening.  Biopsy showed IDC with DCIS, ER/PR-, Her2-.  The left axillary lymph node biopsy was non diagnostic.  MRI demonstrated known LEFT UOQ malignancy 2.8 x 3.0 x 2.7 cm. An indeterminate 5 mm mass in the medial left breast at 9 o'clock noted. An indeterminate 5.3 mm mass in the superior RIGHT breast between 12 and 1 o'clock noted. The previously biopsied left axillary LN was mildly prominent. No other evidence of adenopathy.  MR guided biopsy RIGHT breast scheduled- at time of this no mass noted on imagine. Repeat biopsy US guided left axillary LN showed fibroadipose tissue, no lymphoid tissue present, felt to be discordant and plan removal LN at time surgery.   Completed neoadjuvant chemotherapy. Final MRI showed no residual mass is identified in the superior left breast. Mild NME superior to the biopsy clip left UOQ noted, spanning 9 mm. Stable appearance of axillary lymph nodes. A left axillary LN with biopsy clip artifact measured 6 mm diameter. No MRI evidence of malignancy in the right breast.  Plan to startolaparib postop, to be continued for 1 year.  Diagnosed with BRCA1 at time of 2012 right breast ca.   Current B cup happy with this. Wt stable  Pt works as a Music therapist.  Her husband works for an Engineer, structural (the [employer]) out of [place]. Has young twins in addition to 3 teenagers at  home.  Review of Systems      Objective:   Physical Exam  Cardiovascular: Normal rate, regular rhythm and normal heart sounds.  Pulmonary/Chest: Effort normal and breath sounds normal.  Skin:  Fitzpatrick 2   Right chest port Right<left breast volume Right breast curvilinear scar UIQ, no ptosis Left breast pseudoptosis  SN to nipple R 20 L 21.5 cm BW R 15 L 17 cm CW 12 cm Nipple to IMF R 6 L 6.5 cm     Assessment:     History right breast cancer, s/p chemotherapy, lumpectomy BRCA2 Left breast ca UOQ ER- Neoadjuvant chemotherapy    Plan:     Plan bilateral mastectomies with immediate expander acellular dermis reconstruction.  Discussed use of acellular dermis in reconstruction, cadaveric source, incorporation over several weeks, risk that if has seroma or infection can act as additional nidus for infection if not incorporated.Reviewed this is an off label use of acellular dermis.  Reviewedprepectoral vs sub pectoral reconstruction. Discussed with patient and benefit of this is no animation deformity, may be less pain. Risk may be more visible rippling over upper poles, greater need of ADM. Reviewed pre pectoral would require larger amount acellular dermis, more drains. Discussed any type reconstruction also risks long term displacement implant and visible rippling. If prepectoral counseled I would recommend she be comfortable with silicone implants as more options that have less rippling. Sheagrees to prepectoral placement.  Reviewed overnight stay, drains, dressings, and post op limitations  Reviewed SSM vs NSM, both will be asensate and not stimulate. Reviewed with both  risks mastectomy flap necrosis requiring additional surgery. Aesthetically she is candidate for NSM. Patient has elected for resection of nipple with preservation areola skin. Reviewed IMF and nipple scars.  Additional risks including but not limited to bleeding, hematoma, seroma, need for  additional procedures, damage to adjacent structures, blood clots in legs or lungs, unacceptable cosmetic result reviewed.  Discussed risk COVID infectionthrough this elective surgery. Patient will receive COVID testing prior to surgery. Discussed even if patient receivesa negative test result, the tests in some cases may fail to detect the virus or patient maycontract COVID after the test.COVID 19 infectionbefore/during/aftersurgery may result in lead to a higher chance of complication and death.  Rx for [facility] to [place] given. Drain teaching completed. Rx for tramadol, robaxin, and bactrim given.

## 2019-11-19 NOTE — Progress Notes (Signed)
PCP - Dr. Franne Forts Cardiologist - denies Oncologist - Dr. Lurline Del  PPM/ICD - denies  Chest x-ray - N/A EKG - N/A Stress Test- denies  ECHO - denies Cardiac Cath - denies  Sleep Study - denies CPAP - N/A  DM: denies  Blood Thinner Instructions: N/A Aspirin Instructions: N/A  ERAS Protcol - Yes  COVID TEST- Scheduled for 11/23/2019. Patient verbalized understanding of self-quarantine instructions, appointment time and place.  Anesthesia review: YES, last chemo 09/2019  Patient denies shortness of breath, fever, cough and chest pain at PAT appointment  All instructions explained to the patient, with a verbal understanding of the material. Patient agrees to go over the instructions while at home for a better understanding. Patient also instructed to self quarantine after being tested for COVID-19. The opportunity to ask questions was provided.

## 2019-11-20 ENCOUNTER — Telehealth: Payer: Self-pay | Admitting: *Deleted

## 2019-11-20 NOTE — Telephone Encounter (Signed)
Per Dr. Jana Hakim, pt may have port removed at time of sx. Msg sent to Dr. [name] and team.  Pt notified

## 2019-11-23 ENCOUNTER — Other Ambulatory Visit (HOSPITAL_COMMUNITY)
Admission: RE | Admit: 2019-11-23 | Discharge: 2019-11-23 | Disposition: A | Discharge status: Home / Self Care | Payer: Commercial Managed Care - PPO | Source: Ambulatory Visit | Attending: Surgery | Admitting: Surgery

## 2019-11-23 DIAGNOSIS — Z01812 Encounter for preprocedural laboratory examination: Secondary | ICD-10-CM | POA: Insufficient documentation

## 2019-11-23 DIAGNOSIS — Z20822 Contact with and (suspected) exposure to covid-19: Secondary | ICD-10-CM | POA: Diagnosis not present

## 2019-11-23 LAB — SARS CORONAVIRUS 2 (TAT 6-24 HRS): SARS Coronavirus 2: NEGATIVE

## 2019-11-26 NOTE — H&P (Signed)
History of Present Illness  The patient is a 34 year old female who presents with breast cancer. Oncology- Reeves - placed by IR  This is a 34 year old female who presented in May 2021 with left breast cancer. She has a history of breast cancer diagnosed in 2012 and treated in [place]. Apparently she had neoadjuvant chemotherapy and had a lumpectomy through a right upper inner quadrant incision. Interestingly, she did not have any sentinel lymph node biopsy. She did not have any radiation. She was found to be BRCA1 positive. In April of this year, she noted some palpable changes. She underwent diagnostic mammogram and ultrasound on 4/22. She has breast density deep. The left upper outer quadrant of the breast showed a 2.8 cm mass as well as 1 single enlarged lymph node. She underwent biopsy. 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 The left axillary lymph node biopsy was nondiagnostic. She was referred to oncology. She underwent staging CT scans that were unremarkable. Port was placed by interventional radiology. She underwent bilateral breast MRI that revealed the known malignancy measuring 2.8 x 3.0 x 2.7 cm in the left upper outer quadrant. The medial left breast at 9:00 shows a 5 mm indeterminate mass. There is another 5.3 mm mass in the superior right breast. There are no other signs of lymphadenopathy. She underwent another right breast MRI with plans for a MR guided biopsy on 5/26. However at that time, the area in the upper right breast no longer enhanced so no biopsy was performed.  She is in the middle of her course of neoadjuvant chemotherapy. She has already been evaluated by plastic surgery. She is planning bilateral mastectomies with immediate reconstruction. She has not yet decided between conventional mastectomies versus nipple sparing mastectomies. She is scheduled to see Dr. [name] again  in July to plan her surgery.  CLINICAL DATA: Breast cancer staging.  EXAM: CT CHEST, ABDOMEN, AND PELVIS WITH CONTRAST  TECHNIQUE: Multidetector CT imaging of the chest, abdomen and pelvis was performed following the standard protocol during bolus administration of intravenous contrast.  CONTRAST: 120m OMNIPAQUE IOHEXOL 300 MG/ML SOLN  COMPARISON: None  FINDINGS: CT CHEST FINDINGS  Cardiovascular: Normal caliber thoracic aorta. Normal heart size. No pericardial effusion. No significant atherosclerotic changes. Central pulmonary vessels are unremarkable.  Mediastinum/Nodes: No thoracic inlet or axillary lymphadenopathy. The clips likely from prior biopsy in the LEFT axilla and in the LEFT and RIGHT breast.  Small lymph nodes along the margin of the LEFT pectoralis muscle (image 22, series 2) 7 mm. Similar small lymph nodes in the LEFT axilla. No subpectoral adenopathy. No supraclavicular adenopathy  Lungs/Pleura: Airways are patent. Lungs are clear.  Musculoskeletal: Heterogeneous enhancement of the LEFT breast near biopsy clips measuring 19 mm (image 26, series 2. No internal mammary adenopathy or definable chest wall mass aside from the enhancement in the LEFT breast. Smaller areas of enhancement adjacent to this focus. This area is adjacent to a presumed biopsy clip in the central portion of the upper LEFT breast.  CT ABDOMEN PELVIS FINDINGS  Hepatobiliary: Liver is normal. Gallbladder is normal. No biliary ductal dilation. No focal hepatic mass.  Pancreas: Pancreas is normal without focal lesion or ductal dilation. No peripancreatic inflammation.  Spleen: Spleen is normal size without focal lesion.  Adrenals/Urinary Tract: Adrenal glands are normal.  Renal enhancement is smooth. No hydronephrosis. Urinary bladder moderately distended, unremarkable by CT.  Stomach/Bowel: No acute gastrointestinal process. The appendix is normal.  Stool  fills much of the colon. Mixed with positive contrast. No pericolonic stranding.  Vascular/Lymphatic: Normal caliber abdominal aorta. No adenopathy in the retroperitoneum. No retrocrural adenopathy. No upper abdominal adenopathy. No visible mesenteric lymphadenopathy.  No pelvic lymphadenopathy.  Reproductive: Corpus luteum cyst in the RIGHT adnexa. CT appearance of pelvic viscera otherwise unremarkable.  Other: No abdominal wall hernia or abnormality. No abdominopelvic ascites.  Musculoskeletal: No acute bone finding. No destructive bone process.  IMPRESSION: 1. Heterogeneous enhancement with nodular features in the LEFT breast and small LEFT axillary lymph nodes, less than a cm as described. 2. No evidence of metastatic disease in the abdomen or pelvis.   Electronically Signed By: Zetta Bills M.D. On: 07/05/2019 15:38  CLINICAL DATA: Recent the diagnosed left breast cancer.  LABS: None avail  EXAM: BILATERAL BREAST MRI WITH AND WITHOUT CONTRAST  TECHNIQUE: Multiplanar, multisequence MR images of both breasts were obtained prior to and following the intravenous administration of 6 ml of Gadavist  Three-dimensional MR images were rendered by post-processing of the original MR data on an independent workstation. The three-dimensional MR images were interpreted, and findings are reported in the following complete MRI report for this study. Three dimensional images were evaluated at the independent DynaCad workstation  COMPARISON: MRI of the breast June 07, 2012. Mammography from April 2021.  FINDINGS: Breast composition: c. Heterogeneous fibroglandular tissue.  Background parenchymal enhancement: Marked  Right breast: Distortion in the central right breast correlates with a previous biopsy and is consistent with biopsy change. There is diffuse background enhancement on the right limiting evaluation for small masses. There are multiple  similar appearing foci and small masses throughout the right breast which demonstrate persistent kinetics and are considered benign. There is a mass in the superior right breast located between 12 o'clock and 1 o'clock measuring 5.3 mm on series 6, image 52 which demonstrates plateau kinetics. No other suspicious masses are seen in the right breast.  Left breast: The patient's known malignancy is identified in the upper outer quadrant of the left breast measuring 2.8 by 3.0 by 2.7 cm in transverse, AP, and craniocaudal dimensions. There is no involvement of the underlying pectoralis muscle identified. A small mass in the upper outer left breast which is T2 bright, seen on series 6, image 78 is consistent with an intramammary lymph node. There is an indeterminate mass at 9 o'clock in the left breast on series 6, image 86 measuring 5 mm which demonstrates plateau kinetics. No other suspicious findings seen in the left breast.  Lymph nodes: The previously biopsied left axillary node is again identified are mildly prominent. No other suspicious nodes are identified on today's study.  Ancillary findings: None.  IMPRESSION: 1. The patient's known malignancy measures 2.8 x 3.0 x 2.7 cm in the upper outer quadrant the left breast. 2. There is an indeterminate 5 mm mass in the medial left breast at 9 o'clock which demonstrates plateau kinetics and is indeterminate. 3. There is an indeterminate 5.3 mm mass in the superior right breast between 12 and 1 o'clock which demonstrates plateau kinetics. 4. There is marked background enhancement throughout the remainder of both breasts with numerous foci and small masses demonstrating persistent kinetics, consistent with a benign etiology. 5. The previously biopsied left axillary lymph node is mildly prominent and contains a biopsy clip. No other evidence of adenopathy.  RECOMMENDATION: The patient is scheduled to return for a re-biopsy of a  left axillary lymph node. The patient is pursuing bilateral mastectomies. As  a result, no additional biopsies are necessary of the left breast. Recommend MRI guided biopsy of the superiorly located right breast mass.  These findings and recommendations were called to [facility] in the cancer center.  BI-RADS CATEGORY 4: Suspicious.   Electronically Signed By: Dorise Bullion III M.D On: 07/09/2019 13:17  CLINICAL DATA: 34 year old premenopausal patient with recent diagnosis left breast cancer, planning bilateral mastectomies. Known BRCA 1 mutation. On recent bilateral breast MRI there was a mass described in the upper inner right breast recommended for MRI guided biopsy. The patient had marked background parenchymal enhancement pattern on the recent breast MRI. Today, she is having her menstrual cycle and recently completed her second cycle of chemotherapy.  LABS: None obtained  EXAM: MR OF THE RIGHT BREAST WITH AND WITHOUT CONTRAST  TECHNIQUE: Multiplanar, multisequence MR images of the right breast were obtained prior to and following the intravenous administration of 6 ml of Gadavist.  Three-dimensional MR images were rendered by post-processing of the original MR data on an independent workstation. The three-dimensional MR images were interpreted, and findings are reported in the following complete MRI report for this study. Three dimensional images were evaluated at the independent DynaCad workstation  COMPARISON: Recent MRI of the breast Jul 06, 2019  FINDINGS: Breast composition: c. Heterogeneous fibroglandular tissue.  Background parenchymal enhancement: Moderate.  Right breast: The background parenchymal enhancement pattern is significantly decreased on today's MRI, and much less nodular today compared to Jul 06, 2019. No focal mass is identified in the right breast to suggest malignancy and there is no target for biopsy.  Lymph nodes: No  abnormal appearing lymph nodes.  Ancillary findings: None.  IMPRESSION: Decreased background parenchymal enhancement of the right breast compared to recent prior on Jul 06, 2019 in this premenopausal patient. There is no enhancing mass to target for biopsy. No suspicious findings.  RECOMMENDATION: Continued treatment planning for known left breast cancer. Patient plans for bilateral mastectomies, and therefore no further imaging follow-up is suggested unless new areas of concern arise.  BI-RADS CATEGORY 1: Negative.   Electronically Signed By: Curlene Dolphin M.D. 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 Cesarean Section - 1   Diagnostic Studies History  Colonoscopy  never Mammogram  within last year Pap Smear  1-5 years ago  Allergies  No Known Drug Allergies   Allergies Reconciled   Medication History Prochlorperazine Maleate (10MG Tablet, Oral) Active. Nivestym (480MCG/1.6ML Solution, Injection) Active. Medications Reconciled  Social History  Alcohol use  Occasional alcohol use. Caffeine use  Coffee. No drug use  Tobacco use  Never smoker.  Family History  Breast Cancer  Family Members In General. Heart Disease  Family Members In General.  Pregnancy / Birth History Age at menarche  63 years. Gravida  1 Length (months) of breastfeeding  >24 Maternal age  30-30 Para  2 Regular periods   Other Problems Breast Cancer     Review of Systems General Not Present- Appetite Loss, Chills, Fatigue, Fever, Night Sweats, Weight Gain and Weight Loss. Skin Not Present- Change in Wart/Mole, Dryness, Hives, Jaundice, New Lesions, Non-Healing  Wounds, Rash and Ulcer. HEENT Not Present- Earache, Hearing Loss, Hoarseness, Nose Bleed, Oral Ulcers, Ringing in the Ears, Seasonal Allergies, Sinus Pain, Sore Throat, Visual Disturbances, Wears glasses/contact lenses  and Yellow Eyes. Breast Present- Breast Mass. Not Present- Breast Pain, Nipple Discharge and Skin Changes. Cardiovascular Not Present- Chest Pain, Difficulty Breathing Lying Down, Leg Cramps, Palpitations, Rapid Heart Rate, Shortness of Breath and Swelling of Extremities. Gastrointestinal Not Present- Abdominal Pain, Bloating, Bloody Stool, Change in Bowel Habits, Chronic diarrhea, Constipation, Difficulty Swallowing, Excessive gas, Gets full quickly at meals, Hemorrhoids, Indigestion, Nausea, Rectal Pain and Vomiting. Female Genitourinary Not Present- Frequency, Nocturia, Painful Urination, Pelvic Pain and Urgency. Neurological Not Present- Decreased Memory, Fainting, Headaches, Numbness, Seizures, Tingling, Tremor, Trouble walking and Weakness. Psychiatric Not Present- Anxiety, Bipolar, Change in Sleep Pattern, Depression, Fearful and Frequent crying. Endocrine Not Present- Cold Intolerance, Excessive Hunger, Hair Changes, Heat Intolerance, Hot flashes and New Diabetes. Hematology Not Present- Blood Thinners, Easy Bruising, Excessive bleeding, Gland problems, HIV and Persistent Infections.  Vitals  Weight: 139.13 lb Height: 68in Body Surface Area: 1.75 m Body Mass Index: 21.15 kg/m  Temp.: 36F  Pulse: 87 (Regular)  BP: 120/78(Sitting, Left Arm, Standard)       Physical Exam  The physical exam findings are as follows: Note: Constitutional: WDWN in NAD, conversant, no obvious deformities; resting comfortably Eyes: Pupils equal, round; sclera anicteric; moist conjunctiva; no lid lag HENT: Oral mucosa moist; good dentition Neck: No masses palpated, trachea midline; no thyromegaly Lungs: CTA bilaterally; normal respiratory effort Breasts: symmetric;  healed RUIQ breast incision; no dominant masses; bilateral fibrocystic changes; no axillary or supraclavicular lymphadenopathy RUQ abdominal wall skin tag CV: Regular rate and rhythm; no murmurs; extremities well-perfused with no edema Abd: +bowel sounds, soft, non-tender, no palpable organomegaly; no palpable hernias Musc: Normal gait; no apparent clubbing or cyanosis in extremities Lymphatic: No palpable cervical or axillary lymphadenopathy Skin: Warm, dry; no sign of jaundice Psychiatric - alert and oriented x 4; calm mood and affect    Assessment & Plan 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 The surgical procedure has been discussed with the patient. Potential risks, benefits, alternative treatments, and expected outcomes have been explained. All of the patient's questions at this time have been answered. The likelihood of reaching the patient's treatment goal is good. The patient understand the proposed surgical procedure and wishes to proceed. Note:I spent approximately 40 minutes with the patient and her husband discussing surgical options. She has already made the decision for bilateral mastectomies. We discussed the difference between conventional mastectomies and nipple sparing mastectomies.   After extensive discussion, she has decided on areola-preserving bilateral mastectomies with left axillary sentinel lymph node biopsy.  The surgical procedure has been discussed with the patient.  Potential risks, benefits, alternative treatments, and expected outcomes have been explained.  All of the patient's questions at this time have been answered.  The likelihood of reaching the patient's treatment goal is good.  The  patient understand the proposed surgical procedure and wishes to proceed.    Imogene Burn. [name], MD, [facility]  General/ Trauma Surgery   11/26/2019 9:23 PM

## 2019-11-26 NOTE — Anesthesia Preprocedure Evaluation (Addendum)
Anesthesia Evaluation  Patient identified by MRN, date of birth, ID band Patient awake    Reviewed: Allergy & Precautions, NPO status , Patient's Chart, lab work & pertinent test results  History of Anesthesia Complications (+) PONV and history of anesthetic complications  Airway Mallampati: I  TM Distance: >3 FB Neck ROM: Full    Dental no notable dental hx.    Pulmonary neg pulmonary ROS,    Pulmonary exam normal breath sounds clear to auscultation       Cardiovascular negative cardio ROS Normal cardiovascular exam Rhythm:Regular Rate:Normal     Neuro/Psych negative neurological ROS  negative psych ROS   GI/Hepatic negative GI ROS, Neg liver ROS,   Endo/Other  negative endocrine ROS  Renal/GU negative Renal ROS     Musculoskeletal negative musculoskeletal ROS (+)   Abdominal   Peds  Hematology negative hematology ROS (+)   Anesthesia Other Findings INVASIVE DUCTAL CARCINOMA, HISTORY OF RIGHT BREAST CANCER, BRCA 1  Reproductive/Obstetrics                            Anesthesia Physical Anesthesia Plan  ASA: II  Anesthesia Plan: General and Regional   Post-op Pain Management: GA combined w/ Regional for post-op pain   Induction: Intravenous  PONV Risk Score and Plan: 4 or greater and Ondansetron, Dexamethasone, Midazolam, Treatment may vary due to age or medical condition and Scopolamine patch - Pre-op  Airway Management Planned: Oral ETT  Additional Equipment:   Intra-op Plan:   Post-operative Plan: Extubation in OR  Informed Consent: I have reviewed the patients History and Physical, chart, labs and discussed the procedure including the risks, benefits and alternatives for the proposed anesthesia with the patient or authorized representative who has indicated his/her understanding and acceptance.     Dental advisory given  Plan Discussed with: CRNA  Anesthesia Plan  Comments:       Anesthesia Quick Evaluation

## 2019-11-27 ENCOUNTER — Encounter (HOSPITAL_COMMUNITY): Payer: Self-pay | Admitting: Surgery

## 2019-11-27 ENCOUNTER — Ambulatory Visit (HOSPITAL_COMMUNITY)
Admission: RE | Admit: 2019-11-27 | Discharge: 2019-11-27 | Disposition: A | Discharge status: Home / Self Care | Payer: Commercial Managed Care - PPO | Source: Ambulatory Visit | Attending: Surgery | Admitting: Surgery

## 2019-11-27 ENCOUNTER — Encounter (HOSPITAL_COMMUNITY)
Admission: RE | Disposition: A | Discharge status: Home / Self Care | Payer: Self-pay | Source: Home / Self Care | Attending: Surgery

## 2019-11-27 ENCOUNTER — Ambulatory Visit (HOSPITAL_COMMUNITY): Payer: Commercial Managed Care - PPO | Admitting: Anesthesiology

## 2019-11-27 ENCOUNTER — Ambulatory Visit (HOSPITAL_COMMUNITY): Payer: Commercial Managed Care - PPO | Admitting: Physician Assistant

## 2019-11-27 ENCOUNTER — Observation Stay (HOSPITAL_COMMUNITY)
Admission: RE | Admit: 2019-11-27 | Discharge: 2019-11-28 | Disposition: A | Discharge status: Home / Self Care | Payer: Commercial Managed Care - PPO | Attending: Surgery | Admitting: Surgery

## 2019-11-27 DIAGNOSIS — C50912 Malignant neoplasm of unspecified site of left female breast: Secondary | ICD-10-CM | POA: Diagnosis not present

## 2019-11-27 HISTORY — PX: BREAST RECONSTRUCTION WITH PLACEMENT OF TISSUE EXPANDER AND ALLODERM: SHX6805

## 2019-11-27 HISTORY — PX: MASTECTOMY W/ SENTINEL NODE BIOPSY: SHX2001

## 2019-11-27 HISTORY — PX: PORT-A-CATH REMOVAL: SHX5289

## 2019-11-27 LAB — POCT PREGNANCY, URINE: Preg Test, Ur: NEGATIVE

## 2019-11-27 SURGERY — MASTECTOMY WITH SENTINEL LYMPH NODE BIOPSY
Anesthesia: Regional | Site: Chest | Laterality: Right

## 2019-11-27 MED ORDER — LACTATED RINGERS IV SOLN: INTRAVENOUS | Status: DC | PRN

## 2019-11-27 MED ORDER — SODIUM CHLORIDE (PF) 0.9 % IJ SOLN
INTRAVENOUS | Status: DC | PRN
  Administered 2019-11-27: 5 mL via INTRAMUSCULAR

## 2019-11-27 MED ORDER — FENTANYL CITRATE (PF) 100 MCG/2ML IJ SOLN: 25.0000 ug | INTRAMUSCULAR | Status: DC | PRN

## 2019-11-27 MED ORDER — GABAPENTIN 300 MG PO CAPS
300.0000 mg | ORAL_CAPSULE | Freq: Two times a day (BID) | ORAL | Status: DC
  Filled 2019-11-27 (×2): qty 1

## 2019-11-27 MED ORDER — PROPOFOL 10 MG/ML IV BOLUS
INTRAVENOUS | Status: AC
  Filled 2019-11-27: qty 20

## 2019-11-27 MED ORDER — PROMETHAZINE HCL 25 MG/ML IJ SOLN: 6.2500 mg | INTRAMUSCULAR | Status: DC | PRN

## 2019-11-27 MED ORDER — OXYCODONE HCL 5 MG PO TABS: 5.0000 mg | ORAL_TABLET | ORAL | Status: DC | PRN

## 2019-11-27 MED ORDER — SODIUM CHLORIDE 0.9 % IV SOLN: INTRAVENOUS | Status: DC

## 2019-11-27 MED ORDER — OLAPARIB 150 MG PO TABS: 300.0000 mg | ORAL_TABLET | Freq: Two times a day (BID) | ORAL | Status: DC

## 2019-11-27 MED ORDER — PROMETHAZINE HCL 25 MG/ML IJ SOLN
INTRAMUSCULAR | Status: AC
  Administered 2019-11-27: 6.25 mg via INTRAVENOUS
  Filled 2019-11-27: qty 1

## 2019-11-27 MED ORDER — 0.9 % SODIUM CHLORIDE (POUR BTL) OPTIME
TOPICAL | Status: DC | PRN
  Administered 2019-11-27: 1000 mL

## 2019-11-27 MED ORDER — DEXAMETHASONE SODIUM PHOSPHATE 10 MG/ML IJ SOLN
INTRAMUSCULAR | Status: DC | PRN
  Administered 2019-11-27: 10 mg via INTRAVENOUS

## 2019-11-27 MED ORDER — PHENYLEPHRINE 40 MCG/ML (10ML) SYRINGE FOR IV PUSH (FOR BLOOD PRESSURE SUPPORT)
PREFILLED_SYRINGE | INTRAVENOUS | Status: AC
  Filled 2019-11-27: qty 10

## 2019-11-27 MED ORDER — CEFAZOLIN SODIUM-DEXTROSE 1-4 GM/50ML-% IV SOLN
1.0000 g | Freq: Three times a day (TID) | INTRAVENOUS | Status: AC
  Administered 2019-11-27 – 2019-11-28 (×3): 1 g via INTRAVENOUS
  Filled 2019-11-27 (×3): qty 50

## 2019-11-27 MED ORDER — SUFENTANIL CITRATE 50 MCG/ML IV SOLN
INTRAVENOUS | Status: DC | PRN
Start: 2019-11-27 — End: 2019-11-27
  Administered 2019-11-27 (×3): 10 ug via INTRAVENOUS

## 2019-11-27 MED ORDER — DIPHENHYDRAMINE HCL 50 MG/ML IJ SOLN: 25.0000 mg | Freq: Four times a day (QID) | INTRAMUSCULAR | Status: DC | PRN

## 2019-11-27 MED ORDER — SUFENTANIL CITRATE 50 MCG/ML IV SOLN
INTRAVENOUS | Status: AC
  Filled 2019-11-27: qty 1

## 2019-11-27 MED ORDER — LIDOCAINE 2% (20 MG/ML) 5 ML SYRINGE
INTRAMUSCULAR | Status: AC
  Filled 2019-11-27: qty 5

## 2019-11-27 MED ORDER — OXYCODONE HCL 5 MG/5ML PO SOLN: 5.0000 mg | Freq: Once | ORAL | Status: DC | PRN

## 2019-11-27 MED ORDER — PROPOFOL 10 MG/ML IV BOLUS
INTRAVENOUS | Status: DC | PRN
  Administered 2019-11-27: 50 mg via INTRAVENOUS
  Administered 2019-11-27: 200 mg via INTRAVENOUS

## 2019-11-27 MED ORDER — PHENYLEPHRINE HCL (PRESSORS) 10 MG/ML IV SOLN
INTRAVENOUS | Status: DC | PRN
  Administered 2019-11-27 (×2): 40 ug via INTRAVENOUS

## 2019-11-27 MED ORDER — ENOXAPARIN SODIUM 40 MG/0.4ML ~~LOC~~ SOLN
40.0000 mg | SUBCUTANEOUS | Status: DC
  Administered 2019-11-28: 40 mg via SUBCUTANEOUS
  Filled 2019-11-27 (×2): qty 0.4

## 2019-11-27 MED ORDER — KETOROLAC TROMETHAMINE 30 MG/ML IJ SOLN: 30.0000 mg | Freq: Once | INTRAMUSCULAR | Status: AC | PRN

## 2019-11-27 MED ORDER — SODIUM CHLORIDE 0.9 % IV SOLN
INTRAVENOUS | Status: AC
  Administered 2019-11-27: 500 mL
  Filled 2019-11-27: qty 1000

## 2019-11-27 MED ORDER — MIDAZOLAM HCL 2 MG/2ML IJ SOLN
INTRAMUSCULAR | Status: AC
  Filled 2019-11-27: qty 2

## 2019-11-27 MED ORDER — ACETAMINOPHEN 650 MG RE SUPP: 650.0000 mg | Freq: Four times a day (QID) | RECTAL | Status: DC | PRN

## 2019-11-27 MED ORDER — CEFAZOLIN SODIUM 1 G IJ SOLR
INTRAMUSCULAR | Status: AC
  Filled 2019-11-27: qty 20

## 2019-11-27 MED ORDER — ORAL CARE MOUTH RINSE: 15.0000 mL | Freq: Once | OROMUCOSAL | Status: AC

## 2019-11-27 MED ORDER — MORPHINE SULFATE (PF) 2 MG/ML IV SOLN: 2.0000 mg | INTRAVENOUS | Status: DC | PRN

## 2019-11-27 MED ORDER — CEFAZOLIN SODIUM 1 G IJ SOLR
1.0000 g | Freq: Three times a day (TID) | INTRAMUSCULAR | Status: DC
  Filled 2019-11-27 (×2): qty 10

## 2019-11-27 MED ORDER — LIDOCAINE HCL (CARDIAC) PF 100 MG/5ML IV SOSY
PREFILLED_SYRINGE | INTRAVENOUS | Status: DC | PRN
  Administered 2019-11-27: 40 mg via INTRAVENOUS
  Administered 2019-11-27: 100 mg via INTRAVENOUS

## 2019-11-27 MED ORDER — ACETAMINOPHEN 325 MG PO TABS
650.0000 mg | ORAL_TABLET | Freq: Four times a day (QID) | ORAL | Status: DC | PRN
  Administered 2019-11-27 – 2019-11-28 (×2): 650 mg via ORAL
  Filled 2019-11-27 (×3): qty 2

## 2019-11-27 MED ORDER — ROCURONIUM BROMIDE 10 MG/ML (PF) SYRINGE
PREFILLED_SYRINGE | INTRAVENOUS | Status: AC
  Filled 2019-11-27: qty 10

## 2019-11-27 MED ORDER — CEFAZOLIN SODIUM-DEXTROSE 2-3 GM-%(50ML) IV SOLR
INTRAVENOUS | Status: DC | PRN
  Administered 2019-11-27 (×2): 2 g via INTRAVENOUS

## 2019-11-27 MED ORDER — DOCUSATE SODIUM 100 MG PO CAPS
100.0000 mg | ORAL_CAPSULE | Freq: Two times a day (BID) | ORAL | Status: DC
  Administered 2019-11-28: 100 mg via ORAL
  Filled 2019-11-27 (×2): qty 1

## 2019-11-27 MED ORDER — MIDAZOLAM HCL 2 MG/2ML IJ SOLN
INTRAMUSCULAR | Status: DC | PRN
  Administered 2019-11-27: 2 mg via INTRAVENOUS

## 2019-11-27 MED ORDER — TECHNETIUM TC 99M SULFUR COLLOID FILTERED: 1.0000 | Freq: Once | INTRAVENOUS | Status: DC | PRN

## 2019-11-27 MED ORDER — SODIUM CHLORIDE (PF) 0.9 % IJ SOLN
INTRAMUSCULAR | Status: AC
  Filled 2019-11-27: qty 10

## 2019-11-27 MED ORDER — ONDANSETRON HCL 4 MG/2ML IJ SOLN: 4.0000 mg | Freq: Four times a day (QID) | INTRAMUSCULAR | Status: DC | PRN

## 2019-11-27 MED ORDER — ZOLPIDEM TARTRATE 5 MG PO TABS: 5.0000 mg | ORAL_TABLET | Freq: Every evening | ORAL | Status: DC | PRN

## 2019-11-27 MED ORDER — KETOROLAC TROMETHAMINE 30 MG/ML IJ SOLN
INTRAMUSCULAR | Status: AC
  Administered 2019-11-27: 30 mg via INTRAVENOUS
  Filled 2019-11-27: qty 1

## 2019-11-27 MED ORDER — SCOPOLAMINE 1 MG/3DAYS TD PT72
MEDICATED_PATCH | TRANSDERMAL | Status: DC | PRN
  Administered 2019-11-27: 1 via TRANSDERMAL

## 2019-11-27 MED ORDER — ONDANSETRON HCL 4 MG/2ML IJ SOLN
INTRAMUSCULAR | Status: DC | PRN
  Administered 2019-11-27: 4 mg via INTRAVENOUS

## 2019-11-27 MED ORDER — BUPIVACAINE HCL (PF) 0.25 % IJ SOLN
INTRAMUSCULAR | Status: DC | PRN
  Administered 2019-11-27 (×2): 30 mL

## 2019-11-27 MED ORDER — CHLORHEXIDINE GLUCONATE 0.12 % MT SOLN
15.0000 mL | Freq: Once | OROMUCOSAL | Status: AC
  Administered 2019-11-27: 15 mL via OROMUCOSAL
  Filled 2019-11-27: qty 15

## 2019-11-27 MED ORDER — ACETAMINOPHEN 500 MG PO TABS
1000.0000 mg | ORAL_TABLET | Freq: Once | ORAL | Status: AC
  Administered 2019-11-27: 1000 mg via ORAL
  Filled 2019-11-27: qty 2

## 2019-11-27 MED ORDER — DEXAMETHASONE SODIUM PHOSPHATE 10 MG/ML IJ SOLN
INTRAMUSCULAR | Status: AC
  Filled 2019-11-27: qty 1

## 2019-11-27 MED ORDER — ROCURONIUM 10MG/ML (10ML) SYRINGE FOR MEDFUSION PUMP - OPTIME
INTRAVENOUS | Status: DC | PRN
  Administered 2019-11-27: 40 mg via INTRAVENOUS
  Administered 2019-11-27: 60 mg via INTRAVENOUS

## 2019-11-27 MED ORDER — LACTATED RINGERS IV SOLN: INTRAVENOUS | Status: DC

## 2019-11-27 MED ORDER — ONDANSETRON HCL 4 MG/2ML IJ SOLN
INTRAMUSCULAR | Status: AC
  Filled 2019-11-27: qty 2

## 2019-11-27 MED ORDER — TRAMADOL HCL 50 MG PO TABS
50.0000 mg | ORAL_TABLET | Freq: Four times a day (QID) | ORAL | Status: DC | PRN
  Filled 2019-11-27: qty 1

## 2019-11-27 MED ORDER — SUGAMMADEX SODIUM 200 MG/2ML IV SOLN
INTRAVENOUS | Status: DC | PRN
  Administered 2019-11-27: 200 mg via INTRAVENOUS

## 2019-11-27 MED ORDER — ONDANSETRON 4 MG PO TBDP: 4.0000 mg | ORAL_TABLET | Freq: Four times a day (QID) | ORAL | Status: DC | PRN

## 2019-11-27 MED ORDER — OXYCODONE HCL 5 MG PO TABS: 5.0000 mg | ORAL_TABLET | Freq: Once | ORAL | Status: DC | PRN

## 2019-11-27 MED ORDER — DIPHENHYDRAMINE HCL 25 MG PO CAPS: 25.0000 mg | ORAL_CAPSULE | Freq: Four times a day (QID) | ORAL | Status: DC | PRN

## 2019-11-27 MED ORDER — METHYLENE BLUE 0.5 % INJ SOLN
INTRAVENOUS | Status: AC
  Filled 2019-11-27: qty 10

## 2019-11-27 SURGICAL SUPPLY — 79 items
ALLOGRAFT PERF 16X20 1.6+/-0.4 (Tissue) ×2 IMPLANT
APPLIER CLIP 9.375 MED OPEN (MISCELLANEOUS) ×3
BAG DECANTER FOR FLEXI CONT (MISCELLANEOUS) ×3 IMPLANT
BINDER BREAST LRG (GAUZE/BANDAGES/DRESSINGS) ×1 IMPLANT
BINDER BREAST XLRG (GAUZE/BANDAGES/DRESSINGS) IMPLANT
CANISTER SUCT 3000ML PPV (MISCELLANEOUS) ×6 IMPLANT
CHLORAPREP W/TINT 26 (MISCELLANEOUS) ×6 IMPLANT
CLIP APPLIE 9.375 MED OPEN (MISCELLANEOUS) ×2 IMPLANT
CNTNR URN SCR LID CUP LEK RST (MISCELLANEOUS) ×2 IMPLANT
CONT SPEC 4OZ STRL OR WHT (MISCELLANEOUS) ×1
COVER PROBE W GEL 5X96 (DRAPES) ×3 IMPLANT
COVER SURGICAL LIGHT HANDLE (MISCELLANEOUS) ×5 IMPLANT
COVER WAND RF STERILE (DRAPES) ×4 IMPLANT
DERMABOND ADVANCED (GAUZE/BANDAGES/DRESSINGS) ×1
DERMABOND ADVANCED .7 DNX12 (GAUZE/BANDAGES/DRESSINGS) ×4 IMPLANT
DRAIN CHANNEL 15F RND FF W/TCR (WOUND CARE) ×2 IMPLANT
DRAIN CHANNEL 19F RND (DRAIN) ×4 IMPLANT
DRAPE CHEST BREAST 15X10 FENES (DRAPES) ×2 IMPLANT
DRAPE HALF SHEET 40X57 (DRAPES) ×3 IMPLANT
DRAPE INCISE IOBAN 66X45 STRL (DRAPES) ×1 IMPLANT
DRAPE ORTHO SPLIT 77X108 STRL (DRAPES) ×2
DRAPE SURG ORHT 6 SPLT 77X108 (DRAPES) ×4 IMPLANT
DRAPE UTILITY XL STRL (DRAPES) ×1 IMPLANT
DRAPE WARM FLUID 44X44 (DRAPES) ×3 IMPLANT
DRSG PAD ABDOMINAL 8X10 ST (GAUZE/BANDAGES/DRESSINGS) ×11 IMPLANT
DRSG TEGADERM 4X4.75 (GAUZE/BANDAGES/DRESSINGS) ×14 IMPLANT
ELECT BLADE 4.0 EZ CLEAN MEGAD (MISCELLANEOUS) ×3
ELECT CAUTERY BLADE 6.4 (BLADE) ×5 IMPLANT
ELECT COATED BLADE 2.86 ST (ELECTRODE) ×3 IMPLANT
ELECT REM PT RETURN 9FT ADLT (ELECTROSURGICAL) ×6
ELECTRODE BLDE 4.0 EZ CLN MEGD (MISCELLANEOUS) ×4 IMPLANT
ELECTRODE REM PT RTRN 9FT ADLT (ELECTROSURGICAL) ×4 IMPLANT
EVACUATOR SILICONE 100CC (DRAIN) ×6 IMPLANT
EXPANDER TISSUE FORTE 300CC (Breast) IMPLANT
GAUZE SPONGE 4X4 12PLY STRL (GAUZE/BANDAGES/DRESSINGS) ×3 IMPLANT
GLOVE BIO SURGEON STRL SZ 6 (GLOVE) ×9 IMPLANT
GLOVE BIO SURGEON STRL SZ7 (GLOVE) ×3 IMPLANT
GLOVE BIOGEL PI IND STRL 7.5 (GLOVE) ×2 IMPLANT
GLOVE BIOGEL PI INDICATOR 7.5 (GLOVE) ×1
GOWN STRL REUS W/ TWL LRG LVL3 (GOWN DISPOSABLE) ×8 IMPLANT
GOWN STRL REUS W/TWL LRG LVL3 (GOWN DISPOSABLE) ×4
KIT BASIN OR (CUSTOM PROCEDURE TRAY) ×6 IMPLANT
KIT TURNOVER KIT B (KITS) ×6 IMPLANT
MARKER SKIN DUAL TIP RULER LAB (MISCELLANEOUS) ×3 IMPLANT
NDL 18GX1X1/2 (RX/OR ONLY) (NEEDLE) ×2 IMPLANT
NDL FILTER BLUNT 18X1 1/2 (NEEDLE) ×2 IMPLANT
NDL HYPO 25GX1X1/2 BEV (NEEDLE) ×2 IMPLANT
NEEDLE 18GX1X1/2 (RX/OR ONLY) (NEEDLE) ×3 IMPLANT
NEEDLE FILTER BLUNT 18X 1/2SAF (NEEDLE) ×1
NEEDLE FILTER BLUNT 18X1 1/2 (NEEDLE) ×2 IMPLANT
NEEDLE HYPO 25GX1X1/2 BEV (NEEDLE) ×3 IMPLANT
NS IRRIG 1000ML POUR BTL (IV SOLUTION) ×9 IMPLANT
PACK GENERAL/GYN (CUSTOM PROCEDURE TRAY) ×6 IMPLANT
PAD ARMBOARD 7.5X6 YLW CONV (MISCELLANEOUS) ×6 IMPLANT
PENCIL SMOKE EVACUATOR (MISCELLANEOUS) ×3 IMPLANT
PIN SAFETY STERILE (MISCELLANEOUS) ×3 IMPLANT
SET ASEPTIC TRANSFER (MISCELLANEOUS) ×3 IMPLANT
SOL PREP POV-IOD 4OZ 10% (MISCELLANEOUS) ×3 IMPLANT
SPECIMEN JAR X LARGE (MISCELLANEOUS) ×3 IMPLANT
SPONGE INTESTINAL PEANUT (DISPOSABLE) ×1 IMPLANT
SPONGE LAP 18X18 RF (DISPOSABLE) ×1 IMPLANT
STAPLER VISISTAT 35W (STAPLE) ×4 IMPLANT
SUT CHROMIC 4 0 PS 2 18 (SUTURE) ×6 IMPLANT
SUT ETHILON 2 0 FS 18 (SUTURE) ×3 IMPLANT
SUT ETHILON 2 0 PS N (SUTURE) ×2 IMPLANT
SUT MNCRL AB 4-0 PS2 18 (SUTURE) ×5 IMPLANT
SUT VIC AB 0 CT2 27 (SUTURE) ×2 IMPLANT
SUT VIC AB 3-0 SH 18 (SUTURE) ×3 IMPLANT
SUT VIC AB 3-0 SH 27 (SUTURE) ×2
SUT VIC AB 3-0 SH 27X BRD (SUTURE) IMPLANT
SUT VIC AB 4-0 PS2 18 (SUTURE) ×2 IMPLANT
SUT VICRYL 4-0 PS2 18IN ABS (SUTURE) IMPLANT
SUT VLOC 180 0 24IN GS25 (SUTURE) ×2 IMPLANT
SYR BULB IRRIG 60ML STRL (SYRINGE) ×3 IMPLANT
SYR CONTROL 10ML LL (SYRINGE) ×3 IMPLANT
TISSUE EXPANDER FORTE 300CC (Breast) ×6 IMPLANT
TOWEL GREEN STERILE (TOWEL DISPOSABLE) ×6 IMPLANT
TOWEL GREEN STERILE FF (TOWEL DISPOSABLE) ×6 IMPLANT
TRAY FOLEY MTR SLVR 16FR STAT (SET/KITS/TRAYS/PACK) IMPLANT

## 2019-11-27 NOTE — Anesthesia Procedure Notes (Signed)
Procedure Name: Intubation Date/Time: 11/27/2019 7:56 AM Performed by: Claris Che, CRNA Pre-anesthesia Checklist: Patient identified, Emergency Drugs available, Suction available, Patient being monitored and Timeout performed Patient Re-evaluated:Patient Re-evaluated prior to induction Oxygen Delivery Method: Circle system utilized Preoxygenation: Pre-oxygenation with 100% oxygen Induction Type: IV induction and Cricoid Pressure applied Ventilation: Mask ventilation without difficulty Laryngoscope Size: Mac and 4 Grade View: Grade II Tube type: Oral Tube size: 7.5 mm Number of attempts: 1 Airway Equipment and Method: Stylet Placement Confirmation: ETT inserted through vocal cords under direct vision,  positive ETCO2 and breath sounds checked- equal and bilateral Secured at: 23 cm Tube secured with: Tape Dental Injury: Teeth and Oropharynx as per pre-operative assessment

## 2019-11-27 NOTE — Anesthesia Procedure Notes (Signed)
Anesthesia Regional Block: Pectoralis block   Pre-Anesthetic Checklist: ,, timeout performed, Correct Patient, Correct Site, Correct Laterality, Correct Procedure, Correct Position, site marked, Risks and benefits discussed,  Surgical consent,  Pre-op evaluation,  At surgeon's request and post-op pain management  Laterality: Right  Prep: chloraprep       Needles:  Injection technique: Single-shot  Needle Type: Echogenic Stimulator Needle     Needle Length: 10cm  Needle Gauge: 20     Additional Needles:   Procedures:,,,, ultrasound used (permanent image in chart),,,,  Narrative:  Start time: 11/27/2019 7:30 AM End time: 11/27/2019 7:40 AM Injection made incrementally with aspirations every 5 mL.  Performed by: Personally  Anesthesiologist: Murvin Natal, MD  Additional Notes: Functioning IV was confirmed and monitors were applied.  A timeout was performed. Sterile prep, hand hygiene and sterile gloves were used. A 158mm 20ga BBraun echogenic stimulator needle was used. Negative aspiration and negative test dose prior to incremental administration of local anesthetic. The patient tolerated the procedure well.  Ultrasound guidance: relevent anatomy identified, needle position confirmed, local anesthetic spread visualized around nerve(s), vascular puncture avoided.  Image printed for medical record.

## 2019-11-27 NOTE — Interval H&P Note (Signed)
History and Physical Interval Note:  11/27/2019 7:03 AM  Joy Reyes  has presented today for surgery, with the diagnosis of INVASIVE DUCTAL CARCINOMA, HISTORY OF RIGHT BREAST CANCER, BRCA 1.  The various methods of treatment have been discussed with the patient and family. After consideration of risks, benefits and other options for treatment, the patient has consented to  Procedure(s) with comments: BILATERAL MASTECTOMIES WITH LEFT AXILLARY SENTINEL LYMPH NODE BIOPSY (Bilateral) - PEC BLOCK, RNFA REQUESTED BILATERAL BREAST RECONSTRUCTION WITH PLACEMENT OF TISSUE EXPANDER AND ALLODERM (Bilateral) as a surgical intervention.  The patient's history has been reviewed, patient examined, no change in status, stable for surgery.  I have reviewed the patient's chart and labs.  Questions were answered to the patient's satisfaction.     Arnoldo Hooker Joy Reyes

## 2019-11-27 NOTE — Anesthesia Procedure Notes (Signed)
Anesthesia Regional Block: Pectoralis block   Pre-Anesthetic Checklist: ,, timeout performed, Correct Patient, Correct Site, Correct Laterality, Correct Procedure, Correct Position, site marked, Risks and benefits discussed,  Surgical consent,  Pre-op evaluation,  At surgeon's request and post-op pain management  Laterality: Left  Prep: chloraprep       Needles:  Injection technique: Single-shot  Needle Type: Echogenic Stimulator Needle     Needle Length: 10cm  Needle Gauge: 20     Additional Needles:   Procedures:,,,, ultrasound used (permanent image in chart),,,,  Narrative:  Start time: 11/27/2019 7:20 AM End time: 11/27/2019 7:30 AM Injection made incrementally with aspirations every 5 mL.  Performed by: Personally  Anesthesiologist: Murvin Natal, MD  Additional Notes: Functioning IV was confirmed and monitors were applied.  A timeout was performed. Sterile prep, hand hygiene and sterile gloves were used. A 139mm 20ga BBraun echogenic stimulator needle was used. Negative aspiration and negative test dose prior to incremental administration of local anesthetic. The patient tolerated the procedure well.  Ultrasound guidance: relevent anatomy identified, needle position confirmed, local anesthetic spread visualized around nerve(s), vascular puncture avoided.  Image printed for medical record.

## 2019-11-27 NOTE — Transfer of Care (Signed)
Immediate Anesthesia Transfer of Care Note  Patient: Jaiya Mooradian  Procedure(s) Performed: BILATERAL MASTECTOMIES WITH LEFT AXILLARY SENTINEL LYMPH NODE BIOPSY (Bilateral Breast) REMOVAL PORT-A-CATH (Right Chest) BILATERAL BREAST RECONSTRUCTION WITH PLACEMENT OF TISSUE EXPANDER AND ALLODERM (Bilateral Breast)  Patient Location: PACU  Anesthesia Type:General  Level of Consciousness: drowsy, patient cooperative and responds to stimulation  Airway & Oxygen Therapy: Patient Spontanous Breathing  Post-op Assessment: Report given to RN and Post -op Vital signs reviewed and stable  Post vital signs: Reviewed and stable  Last Vitals:  Vitals Value Taken Time  BP    Temp    Pulse    Resp    SpO2      Last Pain:  Vitals:   11/27/19 0643  PainSc: 0-No pain         Complications: No complications documented.

## 2019-11-27 NOTE — Anesthesia Postprocedure Evaluation (Signed)
Anesthesia Post Note  Patient: Joy Reyes  Procedure(s) Performed: BILATERAL MASTECTOMIES WITH LEFT AXILLARY SENTINEL LYMPH NODE BIOPSY (Bilateral Breast) REMOVAL PORT-A-CATH (Right Chest) BILATERAL BREAST RECONSTRUCTION WITH PLACEMENT OF TISSUE EXPANDER AND ALLODERM (Bilateral Breast)     Patient location during evaluation: PACU Anesthesia Type: General Level of consciousness: awake and alert Pain management: pain level controlled Vital Signs Assessment: post-procedure vital signs reviewed and stable Respiratory status: spontaneous breathing, nonlabored ventilation, respiratory function stable and patient connected to nasal cannula oxygen Cardiovascular status: blood pressure returned to baseline and stable Postop Assessment: no apparent nausea or vomiting Anesthetic complications: no   No complications documented.  Last Vitals:  Vitals:   11/27/19 1320 11/27/19 1409  BP: (!) 103/55 (!) 103/58  Pulse:  62  Resp: 13 16  Temp: 36.4 C 36.4 C  SpO2: 100% 99%    Last Pain:  Vitals:   11/27/19 1409  TempSrc: Oral  PainSc:                  Tiajuana Amass

## 2019-11-27 NOTE — Interval H&P Note (Signed)
History and Physical Interval Note:  11/27/2019 7:12 AM  Joy Reyes  has presented today for surgery, with the diagnosis of INVASIVE DUCTAL CARCINOMA, HISTORY OF RIGHT BREAST CANCER, BRCA 1.  The various methods of treatment have been discussed with the patient and family. After consideration of risks, benefits and other options for treatment, the patient has consented to  Procedure(s) with comments: BILATERAL MASTECTOMIES WITH LEFT AXILLARY SENTINEL LYMPH NODE BIOPSY (Bilateral) - PEC BLOCK, RNFA REQUESTED BILATERAL BREAST RECONSTRUCTION WITH PLACEMENT OF TISSUE EXPANDER AND ALLODERM (Bilateral) as a surgical intervention.  The patient's history has been reviewed, patient examined, no change in status, stable for surgery.  I have reviewed the patient's chart and labs.  Questions were answered to the patient's satisfaction.     Maia Petties

## 2019-11-27 NOTE — Op Note (Signed)
Preop diagnosis: BRCA1 positive with bilateral breast cancers Postop diagnosis: Same Procedure performed: Bilateral areola sparing mastectomies with left axillary sentinel lymph node biopsy, blue dye injection, removal of right chest port Surgeon:Emika Tiano K Kathie Posa Co-Surgeon :  Dr. [name] Anesthesia: General, bilateral pectoralis blocks Indication: This is a 34 year old female with previous right breast cancer diagnosed in 2012.  She had a lumpectomy but no sentinel lymph node biopsy.  She had neoadjuvant chemotherapy at that time.  She did not have any radiation.  She presented this year after moving to [place] with left breast cancer measuring 2.8 x 3.0 x 2.7 cm in the left upper outer quadrant.  She had other indeterminate masses throughout both breasts.  She underwent further neoadjuvant chemotherapy and has now elected for bilateral areola sparing mastectomies.  She was injected with technetium sulfur colloid in the preoperative area.  Description of procedure: The patient is brought to the operating room and placed in the supine position on the operating room table.  After an adequate level of general anesthesia was obtained, a Foley catheter was placed under sterile technique.  We prepped the chest with ChloraPrep and draped sterile fashion.  A timeout was taken.  I had injected methylene blue dye around the left nipple.  We began on the patient's right side.  We made a transverse incision in inframammary crease measuring 7 cm.  We dissected down to the chest wall at the inframammary crease.  We then raised the breast tissue off of the underlying pectoralis muscle.  We extended this dissection laterally, medially, and superiorly until we reached the edges of the breast.  While we completed our dissection posteriorly I went ahead and remove the port.  We made a small incision over the port in the right subclavian region.  The stay sutures were cut and the port was removed.  Direct  pressure was held for several minutes and there is no sign of bleeding.  That wound was closed with 3-0 Vicryl and 4-0 Monocryl.  We then raised our skin flaps on the anterior surface of the mastectomy.  We made a circular incision around the nipple and dissected down into the breast tissue sharply.  We then raised skin flaps beginning inferiorly at the inframammary crease dissecting the breast tissue away from the overlying subcutaneous fat and dermis.  We continued until we reached the edges of the breast.  We then dissected the remaining breast tissue off of the surrounding tissues and the underlying muscle.  The specimen was oriented with a long suture lateral and a short suture superior.  We inspected for hemostasis.  The specimen was sent for pathologic examination.  We then turned our attention to the right side.  I interrogated the axilla with the neoprobe.  There was an area of activity.  We made a transverse incision across the anterior axilla.  We dissected through the subcutaneous tissues and encountered some blue lymphatics.  We followed these down into the axilla until we identified a single hot blue lymph node.  This was removed and had ex vivo counts of 650.  There was minimal background activity in the axilla.  We sent this as sentinel lymph node #1.  We inspected for hemostasis.  The wound was closed with 3-0 Vicryl and 4-0 Monocryl.  We then we dissected the breast tissue off the underlying muscle posteriorly.  We made a similar circular incision around the nipple and raised anterior flaps.  We then dissected the breast tissue away  from the surrounding tissue.  The specimen was again oriented with a long suture lateral and a short suture superior.  This was sent for pathologic examination.  We inspected both wounds and hemostasis was felt to be good.  I then turned the patient over to Dr. [name] for reconstruction.  Imogene Burn. [name], MD, [facility] Surgery  General/  Trauma Surgery   11/27/2019 12:20 PM

## 2019-11-27 NOTE — Op Note (Signed)
Operative Note   DATE OF OPERATION: 10.5.21  LOCATION: [facility] OR-observation  SURGICAL DIVISION: Plastic Surgery  PREOPERATIVE DIAGNOSES:  1. Left breast ca UOQ ER- 2. History right breast cancer 3. BRCA2 4. Neoadjuvant chemotherapy  POSTOPERATIVE DIAGNOSES:  same  PROCEDURE:  1. Bilateral breast reconstruction with tissue expanders 2. Acellular dermis (Alloderm) to bilateral chest 600 cm2  SURGEON: [name] MD MBA  ASSISTANT: Gentry Fitz RNFA  ANESTHESIA:  General.   EBL: 500 ml for entire procedure  COMPLICATIONS: None immediate.   INDICATIONS FOR PROCEDURE:  The patient, Joy Reyes, is a 34 y.o. female born on 1985/10/05, is here for bilateral tissue expander acellular dermis prepectoral breast reconstruction following bilateral areola sparing mastectomies.   FINDINGS: Natrelle 133S FV-11-T 300 ml tissue expanders placed bilateral, RIGHT fill volume 200 ml air SN [id number] LEFT fill volume 210 ml air SN [id number]  DESCRIPTION OF PROCEDURE:  The patient was marked with the patient in the preoperative area to mark sternal notch, chest midline, anterior axillary lines and inframammary folds.The patient's operative site was prepped and draped in a sterile fashion. A time out was performed and all information was confirmed to be correct. Foley catheter placed.I assisted in mastectomiesand left sentinel node with exposure, dissection, and retraction.Following completion of mastectomies, reconstruction began onrightside.  The cavity was irrigated withsaline followed by salinesolution containingAncef, gentamicinandbetadine. Hemostasis was ensured.A 19 Fr drain was placed in subcutaneous position laterally and a 15 Fr drain placed along inframammary fold. Each secured to skin with 2-0 nylon. The tissue expanders were prepared on back table prior in insertion. The expander was filled with air.Perforated acellular dermiswasdraped over anterior surface expander. The  ADM was then secured to itself over posterior surface of expanderwith 4-0 chromic. Redundant folds acellular dermis excised so that the ADM layflat without folds over air filled expander.The expander was secured tofascia over lateral sternal borderwith a 0 vicryl. Thelateral tab wasalso secured to pectoralis muscle with 0-vicryl. The ADM was secured to pectoralis muscle and chest wall along inferior border at inframammary foldwith 0 V-lock suture.Laterally the mastectomy flap over posterior axillary line was advanced anteriorly and the subcutaneous tissue and superficial fascia was secured to pectoralisand serratusmuscleswith 0-vicryl. Skin closure completedwith 3-0 vicryl in fascial layer and 4-0 vicryl in dermis. Skin closure completed with 4-0 monocryl subcuticular and tissue adhesive. The areolar incision was closed with 3-0 vicryl in dermis and running lockin 4-0 chromic for skin closure.  I then directed my attention toleftchest where similar irrigation and drain placement completed. The prepared expander with ADM secured over anterior surface was placed inleftchest and tabs secured to chest wall and pectoralis muscle with 0- vicryl suture. The acellular dermis at inframammary fold was secured to chest wall with 0 V-lock suture.Laterally the mastectomy flap over posterior axillary line was advanced anteriorly and the subcutaneous tissue and superficial fascia was secured to pectoralisand serratusmuscleswith 0-vicryl. Skin closure completedwith 3-0 vicryl in fascial layer and 4-0 vicryl in dermis. Skin closure completed with 4-0 monocryl subcuticular and tissue adhesive.The areolar incision was closed with 3-0 vicryl in dermis and running lockin 4-0 chromic for skin closure. The mastectomy flaps were redrapedso that areolae were symmetric from sternal notch and chest midline. Tegaderm dressings applied followed bydry dressing,breast binder.  The patient was allowed to wake from  anesthesia, extubated and taken to the recovery room in satisfactory condition.   SPECIMENS: none  DRAINS: 15 and 19 Fr JP in right and left breaset reconstruction

## 2019-11-28 ENCOUNTER — Encounter (HOSPITAL_COMMUNITY): Payer: Self-pay | Admitting: Surgery

## 2019-11-28 DIAGNOSIS — C50912 Malignant neoplasm of unspecified site of left female breast: Secondary | ICD-10-CM | POA: Diagnosis not present

## 2019-11-28 LAB — BASIC METABOLIC PANEL
Anion gap: 9 (ref 5–15)
BUN: 10 mg/dL (ref 6–20)
CO2: 22 mmol/L (ref 22–32)
Calcium: 9 mg/dL (ref 8.9–10.3)
Chloride: 107 mmol/L (ref 98–111)
Creatinine, Ser: 0.78 mg/dL (ref 0.44–1.00)
GFR calc non Af Amer: >60 mL/min (ref >60)
Glucose, Bld: 126 mg/dL — ABNORMAL HIGH (ref 70–99)
Potassium: 3.8 mmol/L (ref 3.5–5.1)
Sodium: 138 mmol/L (ref 135–145)

## 2019-11-28 LAB — CBC
HCT: 30.8 % — ABNORMAL LOW (ref 36.0–46.0)
Hemoglobin: 10.2 g/dL — ABNORMAL LOW (ref 12.0–15.0)
MCH: 35.3 pg — ABNORMAL HIGH (ref 26.0–34.0)
MCHC: 33.1 g/dL (ref 30.0–36.0)
MCV: 106.6 fL — ABNORMAL HIGH (ref 80.0–100.0)
Platelets: 163 10*3/uL (ref 150–400)
RBC: 2.89 MIL/uL — ABNORMAL LOW (ref 3.87–5.11)
RDW: 11.4 % — ABNORMAL LOW (ref 11.5–15.5)
WBC: 6.2 10*3/uL (ref 4.0–10.5)
nRBC: 0 % (ref 0.0–0.2)

## 2019-11-28 NOTE — Discharge Summary (Signed)
Physician Discharge Summary  Patient ID: Joy Reyes MRN: [id number] DOB/AGE: December 08, 1985 34 y.o.  Admit date: 11/27/2019 Discharge date: 11/28/2019  Admission Diagnoses: Left breast ca, BRCA2  Discharge Diagnoses:  same  Discharged Condition: stable  Hospital Course: Post operatively patient did well with pain controlled with oral medication, tolerating diet and ambulatory with minimal assist. Drain and incision care reviewed.  Treatments: surgery: bilateral areola sparing mastectomies left sentinel node removal port bilateral breast reconstruction with tissue expanders acellular dermis 10.5.21  Discharge Exam: Blood pressure (!) 98/55, pulse 61, temperature 98.5 F (36.9 C), temperature source Oral, resp. rate 18, height 5' 8"  (1.727 m), weight 65.6 kg, SpO2 100 %, unknown if currently breastfeeding. Incision/Wound: chest soft skin flaps viable with developing ecchymoses drains serosanguinous incisions intact dry  Disposition: Discharge disposition: 01-Home or Self Care       Discharge Instructions    Call MD for:  redness, tenderness, or signs of infection (pain, swelling, bleeding, redness, odor or green/yellow discharge around incision site)   Complete by: As directed    Call MD for:  temperature >100.5   Complete by: As directed    Discharge instructions   Complete by: As directed    Ok to remove dressings and shower am 10.7.21. Soap and water ok, pat Tegaderms dry. Do not remove Tegaderms. No creams or ointments over incisions. Do not let drains dangle in shower, attach to lanyard or similar.Strip and record drains twice daily and bring log to clinic visit.  Breast binder or soft compression bra all other times.  Ok to raise arms above shoulders for bathing and dressing.  No house yard work or exercise until cleared by MD.    Recommend ibuprofen with meals as directed to aid with pain control. Recommend Miralax or Dulcolax as needed for constipation. Ice packs to  chest for comfort. Also ok to use Tylenol for pain.   Patient received all Rx preop.   Driving Restrictions   Complete by: As directed    No driving for 2 weeks then no driving if taking prescription pain medication   Lifting restrictions   Complete by: As directed    No lifting > 5 lbs until cleared by MD   Resume previous diet   Complete by: As directed      Allergies as of 11/28/2019      Reactions   Paclitaxel    Anaphylactic per pt history in MD note      Medication List    STOP taking these medications   dexamethasone 4 MG tablet Commonly known as: DECADRON   filgrastim-aafi 480 MCG/0.8ML Sosy injection Commonly known as: NIVESTYM   lidocaine-prilocaine cream Commonly known as: EMLA   LORazepam 0.5 MG tablet Commonly known as: Ativan   prochlorperazine 10 MG tablet Commonly known as: COMPAZINE     TAKE these medications   olaparib 150 MG tablet Commonly known as: LYNPARZA Take 2 tablets (300 mg total) by mouth 2 (two) times daily. Swallow whole. May take with food to decrease nausea and vomiting. Start 01/02/2020       Follow-up Information    [name], MD In 1 week.   Specialty: Plastic Surgery Why: as scheduled Contact information: [address] [place] [address] [phone]        [name], MD. Schedule an appointment as soon as possible for a visit in 2 week(s).   Specialty: General Surgery Contact information: [address] [place] [address] [phone]  SignedIrene Limbo 11/28/2019, 7:52 AM

## 2019-11-28 NOTE — Discharge Planning (Signed)
Patient discharged home in stable condition. Verbalizes understanding of all discharge instructions, including home medications, JP drain care and follow up appointments.  ?

## 2019-11-29 ENCOUNTER — Other Ambulatory Visit: Payer: Self-pay | Admitting: Oncology

## 2019-11-29 NOTE — Progress Notes (Signed)
[place]  [phone] [phone]     ID: Joy Reyes DOB: 1985-07-26  MR#: [id number]  TSV#:[id number]  Patient Care Team: Pcp, No as PCP - General Elianis Fischbach, [name], MD as Consulting Physician (Oncology) [name], RN as Oncology Nurse Navigator [name], RN as Oncology Nurse Navigator [name], MD as Consulting Physician (Plastic Surgery) [name], MD as Consulting Physician (General Surgery) [name], MD OTHER MD: PCP= [name] MD  CHIEF COMPLAINT: BRCA positive breast cancer  CURRENT TREATMENT: Awaiting definitive surgery   INTERVAL HISTORY: Marleena Shubert" returns today for follow up and treatment of her BRCA positive breast cancer.  She completed her chemotherapy 09/25/2019.  Her final 2 doses of Abraxane were canceled secondary to grade 1 neuropathy.  That thankfully has completely resolved.  Posttreatment breast MRI 10/03/2019 showed no residual mass in the left breast, mild non-mass enhancement (0.9 cm) superior to the biopsy clip in the upper outer quadrant, stable left axillary lymph nodes and no evidence of malignancy in the right breast.  She is scheduled for bilateral mastectomies and left axillary sentinel lymph node biopsy on 11/27/2019 under Dr. [name].  She has pretty much decided against a nipple sparing mastectomies and will eventually have her nipples tattooed.  REVIEW OF SYSTEMS: Joy Reyes describes herself as still a bit fatigued although she is able to do pretty much all her normal activities.  She tells me she sleeps okay.  And she is exercising regularly.  She still has some of her original hair but it is all rather patchy and she is planning to have the whole scalp buzzed close so that when it grows back it will grow normally.  She has gotten her eyebrows and eyelashes back.  She tells me her husband and children have weathered her treatment well and she is looking forward to  eventually getting back to work full-time herself.    HISTORY OF CURRENT ILLNESS: From the original intake note:  Joy Reyes has a history of breast cancer, diagnosed in 2012 while she lived in [place], [place], and treated in [place] under Dr [name].  We are obtaining data from that experience but according to the patient her tumor was the size of an apricot.  She did not have axillary lymph node sampling or radiation.  She remembers 8 neoadjuvant chemotherapy treatments given every 2 weeks with the first including the "red drug", which suggest Cytoxan and Adriamycin in dose dense fashion x4 most likely followed by either Taxol or Taxotere in dose dense fashion x4.  After that experience and given her breast density and BRCA mutation our practice here would have been a yearly follow-up with MRI in addition to yearly mammography.   More recently Joy Reyes herself palpated a change in her right breast.  She brought it to Dr. [name] attention and underwent bilateral diagnostic mammography with tomography and left breast ultrasonography [facility] on 06/14/2019 showing: breast density category D; there was an area of asymmetry in the upper outer quadrant of the left breast, and ultrasound of that area showed a 2.8 cm mass.  Ultrasound of the axilla showed 1.8 cm lymph node with cortical thickening.    In the right breast they saw only postsurgical changes, with 2 biopsy clips in the retroareolar area.  Accordingly on 06/19/2019 she proceeded to biopsy of the left breast area in question. 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  The left axillary lymph node biopsy was nondiagnostic, with very spare sampling of fibroadipose and lymphoid tissue.  Estrogen receptor was negative, with less than 1% uptake, progesterone receptor was negative, with less than 1% uptake, HER-2 was negative by immunohistochemistry with a  score of 1+.  The patient's subsequent history is as detailed below.   PAST MEDICAL HISTORY: Past Medical History:  Diagnosis Date  . Cancer [facility])    breast cancer 2012  . PONV (postoperative nausea and vomiting)     PAST SURGICAL HISTORY: Past Surgical History:  Procedure Laterality Date  . BREAST RECONSTRUCTION WITH PLACEMENT OF TISSUE EXPANDER AND ALLODERM Bilateral 10/05/20221   BILATERAL BREAST RECONSTRUCTION WITH PLACEMENT OF TISSUE EXPANDER AND ALLODERM (Bilateral Breast)  . BREAST RECONSTRUCTION WITH PLACEMENT OF TISSUE EXPANDER AND ALLODERM Bilateral 11/27/2019   Procedure: BILATERAL BREAST RECONSTRUCTION WITH PLACEMENT OF TISSUE EXPANDER AND ALLODERM;  Surgeon: [name], MD;  Location: [facility];  Service: Plastics;  Laterality: Bilateral;  . BREAST SURGERY     lumpectomy on right breast  . CESAREAN SECTION MULTI-GESTATIONAL N/A 06/01/2015   Procedure: CESAREAN SECTION MULTI-GESTATIONAL;  Surgeon: Waymon Amato, MD;  Location: [facility];  Service: Obstetrics;  Laterality: N/A;  . IR IMAGING GUIDED PORT INSERTION  07/05/2019  . LOBECTOMY Right 2012  . MASTECTOMY W/ SENTINEL NODE BIOPSY Bilateral 11/27/2019    BILATERAL MASTECTOMIES WITH LEFT AXILLARY SENTINEL LYMPH NODE BIOPSY (Bilateral Breast)  . MASTECTOMY W/ SENTINEL NODE BIOPSY Bilateral 11/27/2019   Procedure: BILATERAL MASTECTOMIES WITH LEFT AXILLARY SENTINEL LYMPH NODE BIOPSY;  Surgeon: [name], MD;  Location: [facility];  Service: General;  Laterality: Bilateral;  PEC BLOCK, RNFA REQUESTED  . PORT-A-CATH REMOVAL Right 11/27/2019   Procedure: REMOVAL PORT-A-CATH;  Surgeon: [name], MD;  Location: [facility];  Service: General;  Laterality: Right;  . SALPINGECTOMY      FAMILY HISTORY: Family History  Problem Relation Age of Onset  . Breast cancer Maternal Grandmother        very late in life, lived to 36  . Breast cancer Paternal Grandmother        died at age 81  . Alzheimer's disease Paternal Grandfather      The patient's parents are both turning 68 in 2021.  The paternal grandfather died from Alzheimer's disease.  The paternal grandmother died from breast cancer at the age of 9.  The patient's father had 1 sister who had died as a child from meningitis.  He had no brothers.  On the mother side, a maternal grandmother did have breast cancer very late in life, living to be 43.  The paternal grandfather and the only maternal aunt had no history of cancer.  The patient has 1 brother, in good health, no sisters.   GYNECOLOGIC HISTORY:  No LMP recorded. (Menstrual status: Chemotherapy). Menarche: 34 years old Age at first live birth: 34 years old [place] P twins LMP regular as of May 2021 Contraceptive HRT no  Hysterectomy?  No BSO?  Status post bilateral salpingectomy, no oophorectomy   SOCIAL HISTORY: (updated May 2021)  [name] "Joy Reyes" works as a Music therapist.  Her husband [name]") works for an NGO (the [employer]) out of [place] which helps small agencies doing research set up and succeed.  He wrote a book, [employer], which sounds very interesting and he writes regularly for the [employer] as well.  Their twins are     years old. [name] has (2?) children from a  prior marriage. The patient belongs to an orthodox denomination    ADVANCED DIRECTIVES: In the absence of any documentation to the contrary, the patient's spouse is their HCPOA.    HEALTH MAINTENANCE: Social History   Tobacco Use  . Smoking status: Never Smoker  . Smokeless tobacco: Never Used  Vaping Use  . Vaping Use: Never used  Substance Use Topics  . Alcohol use: Yes    Alcohol/week: 4.0 standard drinks    Types: 4 Glasses of wine per week  . Drug use: No     Colonoscopy: n/a (age)  PAP: Up-to-date  Bone density: n/a (age)   Allergies  Allergen Reactions  . Paclitaxel     Anaphylactic per pt history in MD note    Current Outpatient Medications  Medication Sig Dispense Refill  . olaparib  (LYNPARZA) 150 MG tablet Take 2 tablets (300 mg total) by mouth 2 (two) times daily. Swallow whole. May take with food to decrease nausea and vomiting. Start 01/02/2020 120 tablet 6   No current facility-administered medications for this visit.   Facility-Administered Medications Ordered in Other Visits  Medication Dose Route Frequency Provider Last Rate Last Admin  . technetium sulfur colloid (NYCOMED-Hoven) filtered injection solution 1 millicurie  1 millicurie Intradermal Once PRN Poff, Nicoletta Dress, MD        OBJECTIVE: White woman in no acute distress  There were no vitals filed for this visit.   There is no height or weight on file to calculate BMI.   Wt Readings from Last 3 Encounters:  11/27/19 144 lb 10 oz (65.6 kg)  11/19/19 144 lb 9.6 oz (65.6 kg)  10/12/19 144 lb 9.6 oz (65.6 kg)      ECOG FS:1 - Symptomatic but completely ambulatory  Sclerae unicteric, EOMs intact Wearing a mask No cervical or supraclavicular adenopathy Lungs no rales or rhonchi Heart regular rate and rhythm Abd soft, nontender, positive bowel sounds MSK no focal spinal tenderness, no upper extremity lymphedema Neuro: nonfocal, well oriented, appropriate affect Breasts: The right breast is status post prior lumpectomy.  There is no evidence of local recurrence.  The left breast is status post prior biopsy.  I do not palpate a mass.  Both axillae are benign.  LAB RESULTS:      Component Value Date/Time   NA 138 11/28/2019 0046   K 3.8 11/28/2019 0046   CL 107 11/28/2019 0046   CO2 22 11/28/2019 0046   GLUCOSE 126 (H) 11/28/2019 0046   BUN 10 11/28/2019 0046   CREATININE 0.78 11/28/2019 0046   CALCIUM 9.0 11/28/2019 0046   PROT 7.2 11/09/2019 1022   ALBUMIN 4.3 11/09/2019 1022   AST 30 11/09/2019 1022   ALT 34 11/09/2019 1022   ALKPHOS 37 (L) 11/09/2019 1022   BILITOT 0.4 11/09/2019 1022   GFRNONAA >60 11/28/2019 0046   GFRAA >60 11/19/2019 1340    No results found for: TOTALPROTELP,  ALBUMINELP, A1GS, A2GS, BETS, BETA2SER, GAMS, MSPIKE, SPEI  Lab Results  Component Value Date   WBC 6.2 11/28/2019   NEUTROABS 1.4 (L) 11/09/2019   HGB 10.2 (L) 11/28/2019   HCT 30.8 (L) 11/28/2019   MCV 106.6 (H) 11/28/2019   PLT 163 11/28/2019    No results found for: LABCA2  No components found for: BBCWUG891  No results for input(s): INR in the last 168 hours.  No results found for: LABCA2  No results found for: QXI503  No results found for: UUE280  No results found for: KLK917  No results found for: CA2729  No components found for: HGQUANT  No results found for: CEA1 / No results found for: CEA1   No results found for: AFPTUMOR  No results found for: CHROMOGRNA  No results found for: KPAFRELGTCHN, LAMBDASER, KAPLAMBRATIO (kappa/lambda light chains)  No results found for: HGBA, HGBA2QUANT, HGBFQUANT, HGBSQUAN (Hemoglobinopathy evaluation)   No results found for: LDH  No results found for: IRON, TIBC, IRONPCTSAT (Iron and TIBC)  No results found for: FERRITIN  Urinalysis No results found for: COLORURINE, APPEARANCEUR, LABSPEC, PHURINE, GLUCOSEU, HGBUR, BILIRUBINUR, KETONESUR, PROTEINUR, UROBILINOGEN, NITRITE, LEUKOCYTESUR   STUDIES: NM Sentinel Node Inj-No Rpt (Breast)  Result Date: 11/27/2019 Sulfur colloid was injected by the nuclear medicine technologist for melanoma sentinel node.    ELIGIBLE FOR AVAILABLE RESEARCH PROTOCOL: no  ASSESSMENT: 34 y.o. BRCA1 positive [facility] woman  (0) BRCA1 positivity:  (a) s/p bilateral salpingectomy 11/28/2013, age 63 (no oophorectomy) with benign pathology [id number], [place], [place])  (b) pelvic ultrasound including transvaginal study 06/01/2019 unremarkable, as was CA 125  (c)  bilateral mastectomies 11/27/2019  (d) pt has frozen eggs at [facility]    RIGHT BREAST CANCER (1) Status post right breast and axillary node biopsy 2012 for a clinical T2 N0 invasive ductal carcinoma, grade  3, triple negative ([id number])  (a) received neoadjuvant chemotherapy consisting of 4 cycles of doxorubicin and cyclophosphamide, followed by paclitaxel x1 (developed anaphylaxis with the first dose), then 4 doses of docetaxel completed August 2012  (b) right lumpectomy 01/11/2011 with no sentinel lymph node sampling (refused by patient) 01/11/2011 showed no residual malignancy in the breast (ypT0 ypNX)  (c) no adjuvant radiation LEFT BREAST CANCER (2) Status post left breast upper outer quadrant biopsy 06/19/2019 for a clinical T2 N1, stage IIIB invasive ductal carcinoma, grade 3, triple negative  (a) left axillary lymph node biopsy 07/12/2019 nondiagnostic [discordant]  (b) chest abdomen and pelvis CT scan 07/05/2019 shows small left axillary lymph nodes, 1.5 cm enhancement in the left breast, no internal mammary adenopathy, no evidence of metastatic disease  (c) bone scan 07/13/2019 shows no evidence of bony metastatic disease  (d) biopsy of RIGHT breast mass 07/18/2019 canceled as bilateral mastectomies plan  (3) neoadjuvant chemotherapy consisting of carboplatin day 1, Abraxane days 1,8,15, repeated Q21 days x 4 cycles, starting 07/10/2019, last dose 09/25/2019  (a) final 2 doses of Abraxane omitted secondary to cytopenias and mild neuropathy  (4) status post bilateral nipple sparing mastectomies and left axillary sentinel lymph node sampling 11/27/2019 with pathology showing  (a)       left axillary lymph nodes removed  (b) immediate expander placement 11/27/2019  (5) adjuvant radiation as appropriate  (6) to start OLAPARIB postop, to be continued for 1 year   PLAN: Joy Reyes has recovered remarkably well from her chemotherapy treatments and is ready for surgery in about 2 weeks.  We reviewed the MRI images as well as report today and I am hopeful she will not need any post mastectomy radiation.  She will benefit from olaparib.  We discussed the documented benefits, as well as the  possible side effects toxicities and complications of this agent and the plan will be to start once she is fully recovered from radiation.  She is willing to do this for a year assuming no significant side effects or complications.  I am going to go ahead and place the order for that so we can get over any hurdles that insurance might place but she will  not start until the second week in November which is when she will return to see me.  Total encounter time 35 minutes.*  Olaparib for 1 year at 300 mg twice daily significantly increased (by about 8%) 3-year distant disease-free survival; prolonged side effects aside from cytopenia included nausea and fatigue Joy Reyes is done with her neoadjuvant chemotherapy.  The MRI shows what looks like a complete pathologic response--the small area of non-mass-like enhancement may be artifactual or may be DCIS--and she understands that this correlates well with pathology 90% of the time.  We discussed the fact that adding the last 2 doses of Abraxane might worsen the neuropathy, which is currently very minimal and hopefully will resolve.  I do not think that it will significantly affect her risk of recurrence to omit those 2 doses and that is what we are doing.  Accordingly she is ready to proceed to surgery.  She is still trying to make up her mind as to what kind of mastectomy she would prefer.  She will continue to discuss this with her surgeons.  Today we discussed olaparib and she has been doing some reading on it and is a little bit reluctant to consider it.  We will go over this in much more detail and I will give her a copy of the specific study for her to read out when she returns to see me postop  Total encounter time 35 minutes.Sarajane Jews C. Billiejo Sorto, MD 11/29/19 7:41 AM Medical Oncology and Hematology [facility] [place], [place] [address] Tel. [phone]    Fax. [phone]   I, Wilburn Mylar, am acting as  scribe for Dr. [name]. Prisma Decarlo.  I, Lurline Del MD, have reviewed the above documentation for accuracy and completeness, and I agree with the above.    *Total Encounter Time as defined by the Centers for Medicare and Medicaid Services includes, in addition to the face-to-face time of a patient visit (documented in the note above) non-face-to-face time: obtaining and reviewing outside history, ordering and reviewing medications, tests or procedures, care coordination (communications with other health care professionals or caregivers) and documentation in the medical record.

## 2019-11-30 ENCOUNTER — Ambulatory Visit: Payer: Commercial Managed Care - PPO

## 2019-11-30 LAB — SURGICAL PATHOLOGY

## 2019-12-03 ENCOUNTER — Encounter: Payer: Self-pay | Admitting: *Deleted

## 2019-12-05 DIAGNOSIS — Z9013 Acquired absence of bilateral breasts and nipples: Secondary | ICD-10-CM | POA: Insufficient documentation

## 2019-12-11 ENCOUNTER — Encounter: Payer: Self-pay | Admitting: *Deleted

## 2019-12-17 ENCOUNTER — Telehealth: Payer: Self-pay | Admitting: Pharmacist

## 2019-12-17 NOTE — Telephone Encounter (Signed)
Oral Chemotherapy Pharmacist Encounter  Patient Education I spoke with patient for overview of new oral chemotherapy medication: Lynparza (olaparib) for the adjuvant treatment of BRCA+, TNBC, planned duration for one year.   Pt is doing well. Counseled patient on administration, dosing, side effects, monitoring, drug-food interactions, safe handling, storage, and disposal.  Patient will take one 300 mg tablet every 12 hours with or without food. Recommend with food to decrease nausea/vomiting   Side effects include but not limited to: nausea, diarrhea, fatigue, anemia, neutropenia.    Reviewed with patient importance of keeping a medication schedule and plan for any missed doses.  She voiced understanding and appreciation. All questions answered.  Provided patient with [facility] phone number. Patient knows to call the office with questions or concerns. [facility] will continue to follow.  Eddie Candle, PharmD PGY2 Hematology/Oncology Pharmacy Resident [facility] 12/17/2019 1:47 PM

## 2019-12-20 ENCOUNTER — Encounter: Payer: Self-pay | Admitting: *Deleted

## 2019-12-20 ENCOUNTER — Telehealth: Payer: Self-pay | Admitting: *Deleted

## 2019-12-20 NOTE — Telephone Encounter (Signed)
Pt called to r/s appt with Dr. Jana Hakim from 11/9 d/t husband out of town to attend appt to discuss immunotherapy. Scheduled and confirmed appt for 11/19 at 9am.

## 2020-01-01 ENCOUNTER — Ambulatory Visit: Payer: Commercial Managed Care - PPO | Admitting: Oncology

## 2020-01-10 NOTE — Progress Notes (Signed)
[place]  [phone] [phone]     ID: [name] DOB: [birth year]  MR#: [id number]  JYN#:[id number]  Patient Care Team: Pcp, No as PCP - General [name], [name], MD as Consulting Physician (Oncology) [name], RN as Oncology Nurse Navigator [name], RN as Oncology Nurse Navigator [name], MD as Consulting Physician (Plastic Surgery) [name], MD as Consulting Physician (General Surgery) [name], MD as Consulting Physician (Gynecologic Oncology) [name], MD as Attending Physician (Radiation Oncology) [name], MD OTHER MD: PCP= [name] MD  CHIEF COMPLAINT: BRCA1 positive breast cancer  CURRENT TREATMENT: considering adjuvant radiation, considering olaparib   INTERVAL HISTORY: Joy Reyes" returns today for follow up and treatment of her BRCA positive breast cancer accompanied by her husband [name]..    Since her last visit, she roceeded with bilateral skin-sparing mastectomies and left axillary sentinel lymph node biopsy on 11/27/2019 under Dr. [name] with reconstruction under Dr. [name]. Pathology from the procedure ([id number]) showed no residual carcinoma involving the left breast, as well as benign right breast and a benign left axillary lymph node.   REVIEW OF SYSTEMS: Joy Reyes recovered remarkably well from her surgery.  Pain swelling erythema or drainage have not been issues.  She kept the drains for about a week with 1 lagging after that.  She is tolerating the expanders well and is planning on silicone implants eventually.  A detailed review of systems was otherwise noncontributory   COVID 19 VACCINATION STATUS:     HISTORY OF CURRENT ILLNESS: From the original intake note:  [name] has a history of breast cancer, diagnosed in 2012 while she lived in [place], [place], and treated in [place] under Dr [name].  We are obtaining data from that experience but  according to the patient her tumor was the size of an apricot.  She did not have axillary lymph node sampling or radiation.  She remembers 8 neoadjuvant chemotherapy treatments given every 2 weeks with the first including the "red drug", which suggest Cytoxan and Adriamycin in dose dense fashion x4 most likely followed by either Taxol or Taxotere in dose dense fashion x4.  After that experience and given her breast density and BRCA mutation our practice here would have been a yearly follow-up with MRI in addition to yearly mammography.   More recently Joy Reyes herself palpated a change in her right breast.  She brought it to Dr. [name] attention and underwent bilateral diagnostic mammography with tomography and left breast ultrasonography at [facility] on 06/14/2019 showing: breast density category D; there was an area of asymmetry in the upper outer quadrant of the left breast, and ultrasound of that area showed a 2.8 cm mass.  Ultrasound of the axilla showed 1.8 cm lymph node with cortical thickening.    In the right breast they saw only postsurgical changes, with 2 biopsy clips in the retroareolar area.  Accordingly on 06/19/2019 she proceeded to biopsy of the left breast area in question. 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  The left axillary lymph node biopsy was nondiagnostic, with very spare sampling of fibroadipose and lymphoid tissue.  Estrogen receptor was negative, with less than 1% uptake, progesterone receptor was negative, with less than 1% uptake, HER-2 was negative by immunohistochemistry with a score of 1+.  The patient's subsequent history is as detailed below.   PAST MEDICAL HISTORY: Past Medical History:  Diagnosis  Date  . Cancer [facility])    breast cancer 2012  . PONV (postoperative nausea and vomiting)     PAST SURGICAL HISTORY: Past Surgical History:  Procedure Laterality Date  .  BREAST RECONSTRUCTION WITH PLACEMENT OF TISSUE EXPANDER AND ALLODERM Bilateral 10/05/20221   BILATERAL BREAST RECONSTRUCTION WITH PLACEMENT OF TISSUE EXPANDER AND ALLODERM (Bilateral Breast)  . BREAST RECONSTRUCTION WITH PLACEMENT OF TISSUE EXPANDER AND ALLODERM Bilateral 11/27/2019   Procedure: BILATERAL BREAST RECONSTRUCTION WITH PLACEMENT OF TISSUE EXPANDER AND ALLODERM;  Surgeon: [name], MD;  Location: [place];  Service: Plastics;  Laterality: Bilateral;  . BREAST SURGERY     lumpectomy on right breast  . CESAREAN SECTION MULTI-GESTATIONAL N/A 06/01/2015   Procedure: CESAREAN SECTION MULTI-GESTATIONAL;  Surgeon: Waymon Amato, MD;  Location: [facility];  Service: Obstetrics;  Laterality: N/A;  . IR IMAGING GUIDED PORT INSERTION  07/05/2019  . LOBECTOMY Right 2012  . MASTECTOMY W/ SENTINEL NODE BIOPSY Bilateral 11/27/2019    BILATERAL MASTECTOMIES WITH LEFT AXILLARY SENTINEL LYMPH NODE BIOPSY (Bilateral Breast)  . MASTECTOMY W/ SENTINEL NODE BIOPSY Bilateral 11/27/2019   Procedure: BILATERAL MASTECTOMIES WITH LEFT AXILLARY SENTINEL LYMPH NODE BIOPSY;  Surgeon: [name], MD;  Location: [place];  Service: General;  Laterality: Bilateral;  PEC BLOCK, RNFA REQUESTED  . PORT-A-CATH REMOVAL Right 11/27/2019   Procedure: REMOVAL PORT-A-CATH;  Surgeon: [name], MD;  Location: [place];  Service: General;  Laterality: Right;  . SALPINGECTOMY      FAMILY HISTORY: Family History  Problem Relation Age of Onset  . Breast cancer Maternal Grandmother        very late in life, lived to 74  . Breast cancer Paternal Grandmother        died at age 8  . Alzheimer's disease Paternal Grandfather     The patient's parents are both turning 40 in 2021.  The paternal grandfather died from Alzheimer's disease.  The paternal grandmother died from breast cancer at the age of 63.  The patient's father had 1 sister who had died as a child from meningitis.  He had no brothers.  On the mother side, a maternal  grandmother did have breast cancer very late in life, living to be 28.  The paternal grandfather and the only maternal aunt had no history of cancer.  The patient has 1 brother, in good health, no sisters.   GYNECOLOGIC HISTORY:  No LMP recorded. (Menstrual status: Chemotherapy). Menarche: 34 years old Age at first live birth: 34 years old [place] P twins LMP regular as of May 2021 Contraceptive HRT no  Hysterectomy?  No BSO?  Status post bilateral salpingectomy, no oophorectomy   SOCIAL HISTORY: (updated May 2021)  [name] "Joy Reyes" works as a Music therapist.  Her husband [name]") works for an NGO (the [employer]) out of [place] which helps small agencies doing research set up and succeed.  He wrote a book, [employer], which sounds very interesting and he writes regularly for the [employer] as well.  Their twins are     years old. [name] has (2?) children from a prior marriage. The patient belongs to an orthodox denomination    ADVANCED DIRECTIVES: In the absence of any documentation to the contrary, the patient's spouse is their HCPOA.    HEALTH MAINTENANCE: Social History   Tobacco Use  . Smoking status: Never Smoker  . Smokeless tobacco: Never Used  Vaping Use  . Vaping Use: Never used  Substance Use Topics  . Alcohol  use: Yes    Alcohol/week: 4.0 standard drinks    Types: 4 Glasses of wine per week  . Drug use: No     Colonoscopy: n/a (age)  PAP: Up-to-date  Bone density: n/a (age)   Allergies  Allergen Reactions  . Paclitaxel     Anaphylactic per pt history in MD note    Current Outpatient Medications  Medication Sig Dispense Refill  . olaparib (LYNPARZA) 150 MG tablet Take 2 tablets (300 mg total) by mouth 2 (two) times daily. Swallow whole. May take with food to decrease nausea and vomiting. Start 01/02/2020 120 tablet 6   No current facility-administered medications for this visit.    OBJECTIVE: White woman in no acute  distress  Vitals:   01/11/20 0920  BP: 125/69  Pulse: 70  Resp: 18  Temp: 97.7 F (36.5 C)  SpO2: 100%     Body mass index is 22.44 kg/m.   Wt Readings from Last 3 Encounters:  01/11/20 147 lb 9.6 oz (67 kg)  11/27/19 144 lb 10 oz (65.6 kg)  11/19/19 144 lb 9.6 oz (65.6 kg)      ECOG FS:1 - Symptomatic but completely ambulatory  Sclerae unicteric, EOMs intact Wearing a mask No cervical or supraclavicular adenopathy Lungs no rales or rhonchi Heart regular rate and rhythm Abd soft, nontender, positive bowel sounds MSK no focal spinal tenderness, no upper extremity lymphedema Neuro: nonfocal, well oriented, appropriate affect Breasts: Status post bilateral mastectomies with bilateral expanders in place.  The cosmetic result is very good, with good symmetry.  There are of course no nipples and she is not sure if she will proceed to tattoo at some point.  Both axillae are benign.   LAB RESULTS:      Component Value Date/Time   NA 138 11/28/2019 0046   K 3.8 11/28/2019 0046   CL 107 11/28/2019 0046   CO2 22 11/28/2019 0046   GLUCOSE 126 (H) 11/28/2019 0046   BUN 10 11/28/2019 0046   CREATININE 0.78 11/28/2019 0046   CALCIUM 9.0 11/28/2019 0046   PROT 7.2 11/09/2019 1022   ALBUMIN 4.3 11/09/2019 1022   AST 30 11/09/2019 1022   ALT 34 11/09/2019 1022   ALKPHOS 37 (L) 11/09/2019 1022   BILITOT 0.4 11/09/2019 1022   GFRNONAA >60 11/28/2019 0046   GFRAA >60 11/19/2019 1340    No results found for: TOTALPROTELP, ALBUMINELP, A1GS, A2GS, BETS, BETA2SER, GAMS, MSPIKE, SPEI  Lab Results  Component Value Date   WBC 6.2 11/28/2019   NEUTROABS 1.4 (L) 11/09/2019   HGB 10.2 (L) 11/28/2019   HCT 30.8 (L) 11/28/2019   MCV 106.6 (H) 11/28/2019   PLT 163 11/28/2019    No results found for: LABCA2  No components found for: DJMEQA834  No results for input(s): INR in the last 168 hours.  No results found for: LABCA2  No results found for: HDQ222  No results found for:  LNL892  No results found for: JJH417  No results found for: CA2729  No components found for: HGQUANT  No results found for: CEA1 / No results found for: CEA1   No results found for: AFPTUMOR  No results found for: CHROMOGRNA  No results found for: KPAFRELGTCHN, LAMBDASER, KAPLAMBRATIO (kappa/lambda light chains)  No results found for: HGBA, HGBA2QUANT, HGBFQUANT, HGBSQUAN (Hemoglobinopathy evaluation)   No results found for: LDH  No results found for: IRON, TIBC, IRONPCTSAT (Iron and TIBC)  No results found for: FERRITIN  Urinalysis No results found for: COLORURINE,  APPEARANCEUR, LABSPEC, PHURINE, GLUCOSEU, HGBUR, BILIRUBINUR, KETONESUR, PROTEINUR, UROBILINOGEN, NITRITE, LEUKOCYTESUR   STUDIES: No results found.   ELIGIBLE FOR AVAILABLE RESEARCH PROTOCOL: no  ASSESSMENT: 34 y.o. BRCA1 positive [facility] woman  (x) BRCA1 positivity:  (a) s/p bilateral salpingectomy 11/28/2013, age 77 (no oophorectomy) with benign pathology [phone], [place], [place])  (b) pelvic ultrasound including transvaginal study 06/01/2019 unremarkable, as was CA 125  (c)  bilateral mastectomies 11/27/2019  (d) pt has frozen eggs at [facility]  (e) considering bilateral oophorectomy    RIGHT BREAST CANCER (1) Status post right breast and axillary node biopsy 2012 for a clinical T2 N0 invasive ductal carcinoma, grade 3, triple negative ([id number])  (a) received neoadjuvant chemotherapy consisting of 4 cycles of doxorubicin and cyclophosphamide, followed by paclitaxel x1 (developed anaphylaxis with the first dose), then 4 doses of docetaxel completed August 2012  (b) right lumpectomy 01/11/2011 with no sentinel lymph node sampling (refused by patient) 01/11/2011 showed no residual malignancy in the breast (ypT0 ypNX)  (c) no adjuvant radiation  LEFT BREAST CANCER (2) Status post left breast upper outer quadrant biopsy 06/19/2019 for a clinical T2 N1, stage IIIB invasive  ductal carcinoma, grade 3, triple negative  (a) left axillary lymph node biopsy 07/12/2019 nondiagnostic [discordant]  (b) chest abdomen and pelvis CT scan 07/05/2019 shows small left axillary lymph nodes, 1.5 cm enhancement in the left breast, no internal mammary adenopathy, no evidence of metastatic disease  (c) bone scan 07/13/2019 shows no evidence of bony metastatic disease  (d) biopsy of RIGHT breast mass 07/18/2019 canceled as bilateral mastectomies plan  (3) neoadjuvant chemotherapy consisting of carboplatin day 1, Abraxane days 1,8,15, repeated Q21 days x 4 cycles, starting 07/10/2019, last dose 09/25/2019  (a) final 2 doses of Abraxane omitted secondary to cytopenias and mild neuropathy  (4) status post bilateral nipple sparing mastectomies and left axillary sentinel lymph node sampling 11/27/2019 with pathology showing  (a) on the right, no evidence of cancer  (b) on the left, no residual carcinoma, ypT0 ypN0  (c) a single left axillary lymph node was removed  (5) bilateral tissue expander placement 11/27/2019  (6) adjuvant radiation as appropriate  (7) to start OLAPARIB postop, to be continued for 1 year  (8) bilateral oophorectomy pending   PLAN: Maggie did remarkably well with her chemotherapy and obtaining the best possible results namely a complete pathologic response.  She understands that this predicts a good long-term prognosis for her.  Once the skin expansion has been completed she is planning on silicone implants.  She does not think she is very interested in tattoos for her nipples, but she will consider that at some point in the future  We consider adjuvant radiation whenever there is a positive lymph node.  The exact benefit of that in the setting of someone with a complete pathologic response is difficult to ascertain.  I do think that she should discuss this with radiation oncology and I have contacted Dr. Isidore Moos who will set up a meeting with the patient to do  just that.  I also introduced her to [facility].  Joy Reyes is interested in having a bilateral oophorectomy in the not too distant future, likely in the early part of 2022.  They will be discussing that surgery as well.  Finally we also discussed olaparib.  She has been reading up on it and is well aware of the possible toxicities side effects and complications.  Basically Olaparib for 1 year at 300 mg twice  daily significantly increased (by about 8%) 3-year distant disease-free survival; more common side effects aside from cytopenia included nausea and fatigue.  My best guess is that she will be ready to start olaparib in early February.  I am going to do a catch-up virtual visit with her late January to make sure.  We would then see how she tolerates it and the options will be if she does not tolerated well to drop the dose or discontinue  She knows to call for any other issue that may develop before the next visit.  Total encounter time 35 minutes.Sarajane Jews C. Carreen Milius, MD 01/11/20 11:07 AM Medical Oncology and Hematology [facility] [place], [place] [address] Tel. [phone]    Fax. [phone]   I, Wilburn Mylar, am acting as scribe for Dr. [name]. Davidson Palmieri.  I, Lurline Del MD, have reviewed the above documentation for accuracy and completeness, and I agree with the above.   *Total Encounter Time as defined by the Centers for Medicare and Medicaid Services includes, in addition to the face-to-face time of a patient visit (documented in the note above) non-face-to-face time: obtaining and reviewing outside history, ordering and reviewing medications, tests or procedures, care coordination (communications with other health care professionals or caregivers) and documentation in the medical record.

## 2020-01-11 ENCOUNTER — Other Ambulatory Visit: Payer: Self-pay

## 2020-01-11 ENCOUNTER — Inpatient Hospital Stay: Payer: Commercial Managed Care - PPO | Attending: Oncology | Admitting: Oncology

## 2020-01-11 VITALS — BP 125/69 | HR 70 | Temp 97.7°F | Resp 18 | Ht 68.0 in | Wt 147.6 lb

## 2020-01-11 DIAGNOSIS — Z9013 Acquired absence of bilateral breasts and nipples: Secondary | ICD-10-CM | POA: Diagnosis not present

## 2020-01-11 DIAGNOSIS — G629 Polyneuropathy, unspecified: Secondary | ICD-10-CM | POA: Insufficient documentation

## 2020-01-11 DIAGNOSIS — C50911 Malignant neoplasm of unspecified site of right female breast: Secondary | ICD-10-CM | POA: Diagnosis not present

## 2020-01-11 DIAGNOSIS — Z818 Family history of other mental and behavioral disorders: Secondary | ICD-10-CM | POA: Insufficient documentation

## 2020-01-11 DIAGNOSIS — Z79899 Other long term (current) drug therapy: Secondary | ICD-10-CM | POA: Diagnosis not present

## 2020-01-11 DIAGNOSIS — C50412 Malignant neoplasm of upper-outer quadrant of left female breast: Secondary | ICD-10-CM

## 2020-01-11 DIAGNOSIS — Z171 Estrogen receptor negative status [ER-]: Secondary | ICD-10-CM

## 2020-01-11 DIAGNOSIS — C50211 Malignant neoplasm of upper-inner quadrant of right female breast: Secondary | ICD-10-CM | POA: Diagnosis not present

## 2020-01-11 DIAGNOSIS — Z1501 Genetic susceptibility to malignant neoplasm of breast: Secondary | ICD-10-CM

## 2020-01-11 DIAGNOSIS — Z9079 Acquired absence of other genital organ(s): Secondary | ICD-10-CM | POA: Diagnosis not present

## 2020-01-11 DIAGNOSIS — Z7289 Other problems related to lifestyle: Secondary | ICD-10-CM | POA: Diagnosis not present

## 2020-01-11 DIAGNOSIS — C50912 Malignant neoplasm of unspecified site of left female breast: Secondary | ICD-10-CM | POA: Diagnosis not present

## 2020-01-11 DIAGNOSIS — Z803 Family history of malignant neoplasm of breast: Secondary | ICD-10-CM | POA: Insufficient documentation

## 2020-01-14 ENCOUNTER — Telehealth: Payer: Self-pay | Admitting: *Deleted

## 2020-01-14 ENCOUNTER — Telehealth: Payer: Self-pay | Admitting: Oncology

## 2020-01-14 ENCOUNTER — Encounter: Payer: Self-pay | Admitting: *Deleted

## 2020-01-14 NOTE — Telephone Encounter (Signed)
Called the patient and scheduled a new patient appt for 1/12 at 9 am with Dr Denman George per patient request. Patient requested to have appt after her second breast surgery. Patient aware of address and policy

## 2020-01-14 NOTE — Telephone Encounter (Signed)
Scheduled appts per 11/19 los. Pt confirmed appt date and time.

## 2020-01-21 NOTE — Progress Notes (Signed)
Location of Breast Cancer: Malignant neoplasm of upper-outer quadrant of LEFT breast, estrogen receptor negative   Histology per Pathology Report:  11/27/2019 FINAL MICROSCOPIC DIAGNOSIS:  A. BREAST, RIGHT, MASTECTOMY:  - Breast tissue, negative for carcinoma.  - Biopsy clip (x2).  B. SENTINEL LYMPH NODE, LEFT AXILLARY, BIOPSY:  - One lymph node, negative for carcinoma (0/1).  - Biopsy clip.  C. BREAST, LEFT, MASTECTOMY:  - No residual invasive carcinoma status post neoadjuvant treatment.  - Biopsy clip.  - See oncology table.  D. BREAST, LEFT, ADDITIONAL ANTERIOR MARGIN, EXCISION:  - Breast tissue, negative for carcinoma.  Receptor Status: ER(negative), PR (negative), Her2-neu (negative), Ki-67(not reported)  Did patient present with symptoms (if so, please note symptoms) or was this found on screening mammography?:  Patient herself palpated a change in her right breast.  She brought it to Dr. [name] (PCP) attention and underwent bilateral diagnostic mammography with tomography and left breast ultrasonography at [place] [facility] on 06/14/2019 showing: breast density category D; there was an area of asymmetry in the upper outer quadrant of the left breast, and ultrasound of that area showed a 2.8 cm mass.  Ultrasound of the axilla showed 1.8 cm lymph node with cortical thickening. Patient has a history of breast cancer, diagnosed in 2012 while she lived in [place], [place], and treated was in [place] under Dr [name].  Past/Anticipated interventions by surgeon, if any: 11/27/2019 -Dr. [name] Bilateral areola sparing mastectomies with left axillary sentinel lymph node biopsy, blue dye injection, removal of right chest port -Dr. [name] 1. Bilateral breast reconstruction with tissue expanders  2. Acellular dermis ([employer]) to bilateral chest 600 cm2  Past/Anticipated interventions by medical oncology, if any:  Under care of Dr. Lurline Del 01/11/2020 RIGHT BREAST CANCER (1)  Status post right breast and axillary node biopsy 2012 for a clinical T2 N0 invasive ductal carcinoma, grade 3, triple negative ([id number])             (a) received neoadjuvant chemotherapy consisting of 4 cycles of doxorubicin and cyclophosphamide, followed by paclitaxel x1 (developed anaphylaxis with the first dose), then 4 doses of docetaxel completed August 2012             (b) right lumpectomy 01/11/2011 with no sentinel lymph node sampling (refused by patient) 01/11/2011 showed no residual malignancy in the breast (ypT0 ypNX)             (c) no adjuvant radiation  LEFT BREAST CANCER (2) Status post left breast upper outer quadrant biopsy 06/19/2019 for a clinical T2 N1, stage IIIB invasive ductal carcinoma, grade 3, triple negative             (a) left axillary lymph node biopsy 07/12/2019 nondiagnostic [discordant]             (b) chest abdomen and pelvis CT scan 07/05/2019 shows small left axillary lymph nodes, 1.5 cm enhancement in the left breast, no internal mammary adenopathy, no evidence of metastatic disease             (c) bone scan 07/13/2019 shows no evidence of bony metastatic disease             (d) biopsy of RIGHT breast mass 07/18/2019 canceled as bilateral mastectomies plan (3) neoadjuvant chemotherapy consisting of carboplatin day 1, Abraxane days 1,8,15, repeated Q21 days x 4 cycles, starting 07/10/2019, last dose 09/25/2019             (a) final 2 doses of Abraxane omitted secondary to cytopenias  and mild neuropathy (4) status post bilateral nipple sparing mastectomies and left axillary sentinel lymph node sampling 11/27/2019 with pathology showing             (a) on the right, no evidence of cancer             (b) on the left, no residual carcinoma, ypT0 ypN0             (c) a single left axillary lymph node was removed (5) bilateral tissue expander placement 11/27/2019 (6) adjuvant radiation as appropriate (7) to start OLAPARIB postop, to be continued for 1 year (8)  bilateral oophorectomy pending  (a) I also introduced her to [facility].  [name] is interested in having a bilateral oophorectomy in the not too distant future, likely in the early part of 2022.  They will be discussing that surgery as well.  My best guess is that she will be ready to start olaparib in early February.  I am going to do a catch-up virtual visit with her late January to make sure.  We would then see how she tolerates it and the options will be if she does not tolerated well to drop the dose or discontinue  Lymphedema issues, if any:  Patient denies    Pain issues, if any:  Patient denies   SAFETY ISSUES:  Prior radiation? No  Pacemaker/ICD? No  Possible current pregnancy? No  Is the patient on methotrexate? No  Current Complaints / other details:  Nothing of note

## 2020-01-22 ENCOUNTER — Encounter: Payer: Self-pay | Admitting: Radiation Oncology

## 2020-01-22 ENCOUNTER — Ambulatory Visit
Admission: RE | Admit: 2020-01-22 | Discharge: 2020-01-22 | Disposition: A | Discharge status: Home / Self Care | Payer: Commercial Managed Care - PPO | Source: Ambulatory Visit | Attending: Radiation Oncology | Admitting: Radiation Oncology

## 2020-01-22 DIAGNOSIS — C50412 Malignant neoplasm of upper-outer quadrant of left female breast: Secondary | ICD-10-CM

## 2020-01-22 NOTE — Progress Notes (Signed)
Radiation Oncology         [phone] ________________________________  Initial Outpatient Consultation by MyChart Video.  The patient opted for telemedicine to maximize safety during the pandemic.   Name: Joy Reyes MRN: [id number]  Date: 01/22/2020  DOB: 03-23-1985  CC:Pcp, No  [name], Joy Dad, MD   REFERRING PHYSICIAN: [name], Joy Dad, MD  DIAGNOSIS:    ICD-10-CM   1. Malignant neoplasm of upper-outer quadrant of left breast in female, estrogen receptor negative ([place])  C50.412    Z17.1     Cancer Staging Invasive ductal carcinoma of breast, female, left ([place]) Staging form: Breast, AJCC 8th Edition - Clinical stage from 01/22/2020: No stage assigned - Unsigned - Pathologic stage from 01/22/2020: No Stage Recommended (ypT0, pN0, cM0, G3, ER-, PR-, HER2-) - Signed by [name], MD on 01/23/2020  Stage IIIB (T2, N1) Left Breast UOQ Invasive Ductal Carcinoma, ER- / PR- / Her2-, Grade 3  CHIEF COMPLAINT: Here to discuss management of left breast cancer  HISTORY OF PRESENT ILLNESS::Joy Reyes is a 34 y.o. 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 She then underwent a right lumpectomy on 01/11/2011 with no sentinel lymph node sampling (refused by patient), which showed no residual malignancy in the breast. No adjuvant radiation was given at that time.  Most recently, the patient presented with breast abnormality on the following imaging: bilateral diagnostic mammogram on the date of 06/14/2019. Symptoms, if any, at that time, were: a palpable lump in the left breast. Ultrasound of left breast on that same day revealed a 2.8 cm mass in addition to a  1.8 cm lymph node with cortical thickening. Biopsy on the date of 06/19/2019 showed invasive ductal carcinoma of the left breast. Left axillary lymph node showed scant fragments of fibroadipose and lymphoid tissue (nondiagnostic sample, discordant). ER status: negative; PR status: negative; Her2 status: negative; Grade: 3.  CT scan of chest/abdomen/pelvis on 07/05/2019 showed heterogeneous enhancement with nodular features in the left breast and small left axillary lymph nodes that were less than 1 cm. There was no evidence of metastatic disease in the abdomen or pelvis.  MRI of bilateral breasts on 07/06/2019 showed the patient's known malignancy that measured 2.8 x 3.0 x 2.7 cm in the upper outer quadrant of the left breast. There was also noted to be an indeterminate 5 mm mass in the medial left breast at the 9 o'clock position, which was indeterminate and demonstrated plateau kinetics. Additionally, there was an indeterminate 5.3 mm mass in the superior right breast between 12 and 1 o'clock, which demonstrated plateau kinetics. The previously biopsied left axillary lymph node was mildly prominent and contained a biopsy clip, but there was no other evidence of adenopathy. Finally, there was marked background enhancement throughout the remainder of both breasts with numerous foci and small masses demonstrating persistent kinetics, consistent with a benign etiology.  Additional left axillary lymph node biopsy on 07/12/2019 showed benign fibroadipose tissue without the presence of lymphoid tissue.  Bone scan on 07/13/2019 was negative and did not show any evidence of metastatic disease.  MRI of the right breast on 07/18/2019 showed decreased background parenchymal enhancement of the right breast when compared to prior study. There was no enhancing mass  to target for biopsy, nor were there any suspicious findings.   The patient completed four cycles of neoadjuvant chemotherapy with Carboplatin and  Abraxane on 09/25/2019 under the care of Dr. Jana Hakim.   Bilateral breast MRI on 10/03/2019 showed significant positive response to interval neoadjuvant therapy. There was no residual mass identified in the superior left breast. However, there was a mild, 9 mm, non-mass enhancement slightly superior to the biopsy clip artifact in the upper outer quadrant of the left breast. Left axillary lymph nodes appeared stable. There was no MRI evidence of malignancy in the right breast.  The patient underwent bilateral areolar sparing mastectomies with left axillary sentinel lymph node biopsy on the date of 11/27/2019. There was no residual invasive carcinoma found in the left breast status post neoadjuvant treatment. Left additional anterior margin was excised and was negative for carcinoma.Left axillary sentinel lymph node was negative for carcinoma. Finally, right breast tissue was negative for carcinoma.  Jan 17th - anticipated implant placement by Dr [name]. Pending BSO with gyn/onc surgeon.   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 Her husband is present today for the consultation.   PREVIOUS RADIATION THERAPY: No  PAST MEDICAL HISTORY:  has a past medical history of Cancer ([place]) and PONV (postoperative nausea and vomiting).    PAST SURGICAL HISTORY: Past Surgical History:  Procedure Laterality Date  . BREAST RECONSTRUCTION WITH PLACEMENT OF TISSUE EXPANDER AND ALLODERM Bilateral 10/05/20221   BILATERAL BREAST RECONSTRUCTION WITH PLACEMENT OF TISSUE EXPANDER AND ALLODERM (Bilateral Breast)  . BREAST RECONSTRUCTION WITH PLACEMENT OF TISSUE EXPANDER AND ALLODERM Bilateral 11/27/2019   Procedure: BILATERAL BREAST RECONSTRUCTION WITH PLACEMENT OF TISSUE EXPANDER AND ALLODERM;  Surgeon: [name], MD;  Location: [facility];  Service: Plastics;  Laterality: Bilateral;  . BREAST SURGERY     lumpectomy on right breast  . CESAREAN SECTION MULTI-GESTATIONAL N/A 06/01/2015   Procedure: CESAREAN  SECTION MULTI-GESTATIONAL;  Surgeon: Waymon Amato, MD;  Location: [facility];  Service: Obstetrics;  Laterality: N/A;  . IR IMAGING GUIDED PORT INSERTION  07/05/2019  . LOBECTOMY Right 2012  . MASTECTOMY W/ SENTINEL NODE BIOPSY Bilateral 11/27/2019    BILATERAL MASTECTOMIES WITH LEFT AXILLARY SENTINEL LYMPH NODE BIOPSY (Bilateral Breast)  . MASTECTOMY W/ SENTINEL NODE BIOPSY Bilateral 11/27/2019   Procedure: BILATERAL MASTECTOMIES WITH LEFT AXILLARY SENTINEL LYMPH NODE BIOPSY;  Surgeon: [name], MD;  Location: [facility];  Service: General;  Laterality: Bilateral;  PEC BLOCK, RNFA REQUESTED  . PORT-A-CATH REMOVAL Right 11/27/2019   Procedure: REMOVAL PORT-A-CATH;  Surgeon: [name], MD;  Location: [facility];  Service: General;  Laterality: Right;  . SALPINGECTOMY      FAMILY HISTORY: family history includes Alzheimer's disease in her paternal grandfather; Breast cancer in her maternal grandmother and paternal grandmother.  SOCIAL HISTORY:  reports that she has never smoked. She has never used smokeless tobacco. She reports previous alcohol use of about 4.0 standard drinks of alcohol per week. She reports that she does not use drugs.  ALLERGIES: Paclitaxel  MEDICATIONS:  Current Outpatient Medications  Medication Sig Dispense Refill  . olaparib (LYNPARZA) 150 MG tablet Take 2 tablets (300 mg total) by mouth 2 (two) times daily. Swallow whole. May take with food to decrease nausea and vomiting. Start 01/02/2020 (Patient not taking: Reported on 01/22/2020) 120 tablet 6   No current facility-administered medications for this encounter.    REVIEW OF SYSTEMS: As above.   PHYSICAL EXAM:  vitals were not taken for this visit.  General: Alert and oriented, in no acute distress   LABORATORY DATA:  Lab Results  Component Value Date   WBC 6.2 11/28/2019   HGB 10.2 (L) 11/28/2019   HCT 30.8 (L) 11/28/2019   MCV 106.6 (H) 11/28/2019   PLT 163 11/28/2019   CMP     Component Value Date/Time    NA 138 11/28/2019 0046   K 3.8 11/28/2019 0046   CL 107 11/28/2019 0046   CO2 22 11/28/2019 0046   GLUCOSE 126 (H) 11/28/2019 0046   BUN 10 11/28/2019 0046   CREATININE 0.78 11/28/2019 0046   CALCIUM 9.0 11/28/2019 0046   PROT 7.2 11/09/2019 1022   ALBUMIN 4.3 11/09/2019 1022   AST 30 11/09/2019 1022   ALT 34 11/09/2019 1022   ALKPHOS 37 (L) 11/09/2019 1022   BILITOT 0.4 11/09/2019 1022   GFRNONAA >60 11/28/2019 0046   GFRAA >60 11/19/2019 1340       RADIOGRAPHY:  As above     IMPRESSION/PLAN: Left breast cancer, BRCA+, complete PR to neoadjuvant chemotherapy,  s/p bilateral mastectomies. Distant history of right breast cancer.  It was a pleasure meeting the patient today. We discussed the evolving data regarding post mastectomy radiation for breast cancer that had a complete pathologic response to chemotherapy.  I do no think the data is strong enough to warrant holding radiation therapy from her treatment regimen.  I believe that the risk of a locoregional recurrence in her lifetime is high enough to undergo adjuvant radiation therapy, but acknowledged that RT carries significant potential risks, particularly in the setting of breast reconstruction. We discussed the risks, benefits, and side effects of radiotherapy in detail.  I would treat the chest wall and regional nodes (left) to reduce her risk of locoregional recurrence by 2/3.  We discussed that radiation would take approximately 6.5 weeks to complete Risks may include but not necessary be limited to acute and late injury tissue in the radiation fields such as skin irritation (change in color/pigmentation, itching, dryness, pain, peeling). She may experience fatigue. We also discussed possible risk of long term cosmetic changes or scar tissue and the possibility of implant removal due to radiation effects and discomfort/pain. There is also a smaller risk for lung toxicity, cardiac toxicity, brachial plexopathy, lymphedema,  musculoskeletal changes, rib fragility or induction of a second malignancy, late chronic severe soft tissue wound.  We discussed the importance of using contraception to avoid pregnancy during radiation therapy as well.   We spoke about the latest technology that is used to minimize the risk of late effects for patients undergoing radiotherapy to the breast or chest wall. No guarantees of treatment were given. The patient would like to think about proceeding with treatment and get back to me with her decision.  I will contact Dr [name] about coordinating care, in case she chooses to proceed.  On date of service, in total, I spent 60 minutes on this encounter. Patient was seen on MyChart Video. The patient opted for telemedicine to maximize safety during the pandemic.  The patient has given verbal consent for this type of encounter and has been advised to only accept a meeting of this type in a secure network environment. The attendants for this meeting include [name]  and [name].  During the encounter, [name] was located at [facility] Radiation Oncology Department.  Patient was located at home.     __________________________________________   [name], MD  This document serves as a  record of services personally performed by [name], MD. It was created on his behalf by Clerance Lav, a trained medical scribe. The creation of this record is based on the scribe's personal observations and the provider's statements to them. This document has been checked and approved by the attending provider.

## 2020-01-23 ENCOUNTER — Encounter: Payer: Self-pay | Admitting: Radiation Oncology

## 2020-01-24 ENCOUNTER — Encounter: Payer: Self-pay | Admitting: *Deleted

## 2020-01-30 ENCOUNTER — Telehealth: Payer: Self-pay

## 2020-01-30 NOTE — Telephone Encounter (Signed)
Called patient to see if she had decided about whether she wanted to receive radiation before or after her reconstruction surgery. Patient stated she wasn't sure if she was still interested in receiving radiation, but if she was she planned to do it after her surgery. She stated she has another follow-up with her surgeon Dr. [name] next week, so she planned to discuss the matter and make a final decision then. Told patient we would plan follow-up with her or Dr. [name] if we haven't heard final word by the week of 02/11/20. Patient verbalized understanding and agreement of plan. No other needs identified at this time

## 2020-02-07 ENCOUNTER — Telehealth: Payer: Self-pay | Admitting: *Deleted

## 2020-02-07 ENCOUNTER — Other Ambulatory Visit: Payer: Self-pay | Admitting: Oncology

## 2020-02-07 NOTE — Telephone Encounter (Signed)
Received call from pt stating she has decided to forgo xrt and move forward with Olaparib. Physician team notified.

## 2020-02-12 ENCOUNTER — Telehealth: Payer: Self-pay | Admitting: Oncology

## 2020-02-12 NOTE — Telephone Encounter (Signed)
Rescheduled appt per 12/20 sch msg - called pt - no answer/no vmail. Mailed letter with new appt.

## 2020-03-04 ENCOUNTER — Encounter: Payer: Self-pay | Admitting: Gynecologic Oncology

## 2020-03-05 ENCOUNTER — Encounter (HOSPITAL_BASED_OUTPATIENT_CLINIC_OR_DEPARTMENT_OTHER): Payer: Self-pay | Admitting: Plastic Surgery

## 2020-03-05 ENCOUNTER — Encounter: Payer: Self-pay | Admitting: Gynecologic Oncology

## 2020-03-05 ENCOUNTER — Other Ambulatory Visit: Payer: Self-pay

## 2020-03-05 ENCOUNTER — Inpatient Hospital Stay: Payer: Commercial Managed Care - PPO | Attending: Oncology

## 2020-03-05 ENCOUNTER — Inpatient Hospital Stay (HOSPITAL_BASED_OUTPATIENT_CLINIC_OR_DEPARTMENT_OTHER): Payer: Commercial Managed Care - PPO | Admitting: Gynecologic Oncology

## 2020-03-05 VITALS — BP 128/78 | HR 66 | Temp 98.0°F | Resp 18 | Ht 68.0 in | Wt 151.0 lb

## 2020-03-05 DIAGNOSIS — Z9221 Personal history of antineoplastic chemotherapy: Secondary | ICD-10-CM | POA: Diagnosis not present

## 2020-03-05 DIAGNOSIS — Z1509 Genetic susceptibility to other malignant neoplasm: Secondary | ICD-10-CM

## 2020-03-05 DIAGNOSIS — Z1501 Genetic susceptibility to malignant neoplasm of breast: Secondary | ICD-10-CM

## 2020-03-05 DIAGNOSIS — Z803 Family history of malignant neoplasm of breast: Secondary | ICD-10-CM | POA: Insufficient documentation

## 2020-03-05 DIAGNOSIS — Z148 Genetic carrier of other disease: Secondary | ICD-10-CM | POA: Insufficient documentation

## 2020-03-05 DIAGNOSIS — Z1502 Genetic susceptibility to malignant neoplasm of ovary: Secondary | ICD-10-CM | POA: Diagnosis not present

## 2020-03-05 DIAGNOSIS — Z9012 Acquired absence of left breast and nipple: Secondary | ICD-10-CM | POA: Insufficient documentation

## 2020-03-05 DIAGNOSIS — Z9079 Acquired absence of other genital organ(s): Secondary | ICD-10-CM | POA: Insufficient documentation

## 2020-03-05 DIAGNOSIS — Z170 Estrogen receptor positive status [ER+]: Secondary | ICD-10-CM | POA: Diagnosis not present

## 2020-03-05 DIAGNOSIS — C50911 Malignant neoplasm of unspecified site of right female breast: Secondary | ICD-10-CM | POA: Diagnosis not present

## 2020-03-05 NOTE — Patient Instructions (Signed)
Dr Denman George is recommending obtaining baseline ultrasound and CA 125 for evaluation of the ovaries. If these are normal, it is reasonable to delay surgical removal of the ovaries and uterus until after age 34. In the meantime she recommends 6 monthly surveillance ultrasounds and CA 125 assessments and annual visits with her.  Please notify her office if you detect symptoms of bloating or pain in the abdomen and pelvis before that time.  Please contact Dr Serita Grit office (at [phone]) in April, 2022 to request an appointment with her for July, 2022.

## 2020-03-05 NOTE — Progress Notes (Signed)
Consult Note: Gyn-Onc  Consult was requested by Dr. Jana Reyes for the evaluation of Joy Reyes 35 y.o. female  CC:  Chief Complaint  Patient presents with  . Breast cancer, BRCA1 positive, right [facility])    Assessment/Plan:  [name]  is a 35 y.o.  year old with a history of a deleterious mutation in BRCA 1, history of ER/PR/HER2 negative breast cancer. S/p prophylactic mastectomies. S/p prophylactic salpingectomy 2015.   I discussed with the patient and her husband the risk of developing ovarian/primary peritoneal cancer associated with BRCA1 deleterious mutation. I discussed the possible increased risk for serous endometrial cancer associated with this mutation.  I discussed the recommendation for risk reducing BSO +/- hysterectomy after age 29. There are not recommendations for surgery prior to age 14 in this population, particularly for a patient whose family hx is not remarkable for ovarian cancer at young age. Additionally, there are adverse health impacts to castration at a young age.  I discussed that an immeasurable benefit was likely conferred from her prior salpingectomy.   I am recommending TVUS and CA 125 to evaluate for evidence of occult cancer. I recommend these continue until she undergoes risk reducing BSO. I discussed the potential limitation of these screening tests which have not yet been validated as effective in stage shifting and making more curable the diagnosis of ovarian cancer.   She was counseled to notify me between visits if she develops symptoms of ovarian cancer and we reviewed these.   She will see me annually in July, and have 6 monthly TVUS and CA 125 until risk reducing surgery has been performed. When she desires surgery she will be scheduled for a robotic assisted total hysterectomy, bilateral oophorectomy.   HPI: [name] is a 35 year old P1 who was seen in consultation at the request of Dr Joy Reyes for evaluation of a  deleterious mutation in Joy Reyes.  The patient's first diagnosis of triple negative breast cancer was in 2012. At that time it was treated with chemotherapy and surgery. Genetic testing determined that she was a carrier of BRCA 1 deleterious mutation. She then underwent laparoscopic salpingectomy in 2015 for risk reducing purposes. Her treatment at that time took place in [place].  She underwent IVF and conceived a twin pregnancy which she delivered by cesarean section.  In April of 2021 she was diagnosed with a new primary triple negative breast cancer which was treated with chemotherapy and surgery. She had risk reducing bilateral mastectomies with reconstruction. She completed chemotherapy in September, 2021.   She had a screening TVUS in April, 2021 which was normal.   Her medical history is most significant for breast cancer x 2.  Her surgical history is most significant for laparoscopic tubal ligation, cesarean section, breast surgery.  Her gynecologic history is remarkable for no history of prior abnormal pap smears, twin pregnancy delivered at 33 weeks by cesarean section.  Her family cancer history is significant for multiple family members with breast cancer. There is no known ovarian cancer or endometrial cancer among relatives.  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   Current Meds:  Outpatient Encounter Medications as of 03/05/2020  Medication Sig  . olaparib (LYNPARZA) 150 MG tablet Take 2 tablets (300 mg total) by mouth 2 (two) times daily. Swallow whole. May take with food to decrease nausea and vomiting. Start 01/02/2020 (Patient not taking: No sig reported)   No facility-administered encounter medications on file as of 03/05/2020.  Allergy:  Allergies  Allergen Reactions  . Paclitaxel     Anaphylactic per pt history in MD note    Social Hx:   Social History   Socioeconomic History  . Marital status: Married    Spouse name: Not on file  .  Number of children: Not on file  . Years of education: Not on file  . Highest education level: Not on file  Occupational History  . Not on file  Tobacco Use  . Smoking status: Never Smoker  . Smokeless tobacco: Never Used  Vaping Use  . Vaping Use: Never used  Substance and Sexual Activity  . Alcohol use: Not Currently    Alcohol/week: 4.0 standard drinks    Types: 4 Glasses of wine per week  . Drug use: No  . Sexual activity: Yes    Birth control/protection: Surgical  Other Topics Concern  . Not on file  Social History Narrative  . Not on file   Social Determinants of Health   Financial Resource Strain: Not on file  Food Insecurity: Not on file  Transportation Needs: Not on file  Physical Activity: Not on file  Stress: Not on file  Social Connections: Not on file  Intimate Partner Violence: Not on file    Past Surgical Hx:  Past Surgical History:  Procedure Laterality Date  . BREAST RECONSTRUCTION WITH PLACEMENT OF TISSUE EXPANDER AND ALLODERM Bilateral 10/05/20221   BILATERAL BREAST RECONSTRUCTION WITH PLACEMENT OF TISSUE EXPANDER AND ALLODERM (Bilateral Breast)  . BREAST RECONSTRUCTION WITH PLACEMENT OF TISSUE EXPANDER AND ALLODERM Bilateral 11/27/2019   Procedure: BILATERAL BREAST RECONSTRUCTION WITH PLACEMENT OF TISSUE EXPANDER AND ALLODERM;  Surgeon: [name], MD;  Location: [place];  Service: Plastics;  Laterality: Bilateral;  . BREAST SURGERY     lumpectomy on right breast  . CESAREAN SECTION MULTI-GESTATIONAL N/A 06/01/2015   Procedure: CESAREAN SECTION MULTI-GESTATIONAL;  Surgeon: Waymon Amato, MD;  Location: [facility];  Service: Obstetrics;  Laterality: N/A;  . IR IMAGING GUIDED PORT INSERTION  07/05/2019  . LOBECTOMY Right 2012  . MASTECTOMY W/ SENTINEL NODE BIOPSY Bilateral 11/27/2019    BILATERAL MASTECTOMIES WITH LEFT AXILLARY SENTINEL LYMPH NODE BIOPSY (Bilateral Breast)  . MASTECTOMY W/ SENTINEL NODE BIOPSY Bilateral 11/27/2019   Procedure: BILATERAL  MASTECTOMIES WITH LEFT AXILLARY SENTINEL LYMPH NODE BIOPSY;  Surgeon: [name], MD;  Location: [place];  Service: General;  Laterality: Bilateral;  PEC BLOCK, RNFA REQUESTED  . PORT-A-CATH REMOVAL Right 11/27/2019   Procedure: REMOVAL PORT-A-CATH;  Surgeon: [name], MD;  Location: [facility];  Service: General;  Laterality: Right;  . SALPINGECTOMY      Past Medical Hx:  Past Medical History:  Diagnosis Date  . Cancer [facility])    breast cancer 2012  . PONV (postoperative nausea and vomiting)     Past Gynecological History:  See HPI. Last pap normal in April, 2021.  No LMP recorded. (Menstrual status: Chemotherapy).  Family Hx:  Family History  Problem Relation Age of Onset  . Breast cancer Maternal Grandmother        very late in life, lived to 75  . Breast cancer Paternal Grandmother        died at age 25  . Alzheimer's disease Paternal Grandfather   . Ovarian cancer Neg Hx   . Colon cancer Neg Hx   . Endometrial cancer Neg Hx   . Pancreatic cancer Neg Hx   . Prostate cancer Neg Hx     Review of Systems:  Constitutional  Feels well,   ENT Normal appearing ears and nares bilaterally Skin/Breast  No rash, sores, jaundice, itching, dryness Cardiovascular  No chest pain, shortness of breath, or edema  Pulmonary  No cough or wheeze.  Gastro Intestinal  No nausea, vomitting, or diarrhoea. No bright red blood per rectum, no abdominal pain, change in bowel movement, or constipation.  Genito Urinary  No frequency, urgency, dysuria, No bleeding. She had resumed normal menses after completing chemotherapy.  Musculo Skeletal  No myalgia, arthralgia, joint swelling or pain  Neurologic  No weakness, numbness, change in gait,  Psychology  No depression, anxiety, insomnia.   Vitals:  Blood pressure 128/78, pulse 66, temperature 98 F (36.7 C), temperature source Tympanic, resp. rate 18, height 5' 8"  (1.727 m), weight 151 lb (68.5 kg), SpO2 100 %, unknown if currently  breastfeeding.  Physical Exam: WD in NAD Neck  Supple NROM, without any enlargements.  Lymph Node Survey No cervical supraclavicular or inguinal adenopathy Cardiovascular  Well perfused peripheries Lungs  No increased WOB Skin  No rash/lesions/breakdown  Psychiatry  Alert and oriented to person, place, and time  Abdomen  Normoactive bowel sounds, abdomen soft, non-tender and thin without evidence of hernia.  Back No CVA tenderness Genito Urinary  Vulva/vagina: Normal external female genitalia.  No lesions. No discharge or bleeding.  Bladder/urethra:  No lesions or masses, well supported bladder  Vagina: normal  Cervix: Normal appearing, no lesions.  Uterus:  Small, mobile, no parametrial involvement or nodularity.  Adnexa: no palpable masses. Rectal  deferred Extremities  No bilateral cyanosis, clubbing or edema.  60 minutes of total time was spent for this patient encounter, including preparation, face-to-face counseling with the patient and coordination of care, review of imaging (results and images), communication with the referring provider and documentation of the encounter.   Thereasa Solo, MD  03/05/2020, 11:26 AM

## 2020-03-06 ENCOUNTER — Other Ambulatory Visit (HOSPITAL_COMMUNITY): Payer: Commercial Managed Care - PPO

## 2020-03-06 LAB — CA 125: Cancer Antigen (CA) 125: 10.2 U/mL (ref 0.0–38.1)

## 2020-03-07 ENCOUNTER — Other Ambulatory Visit (HOSPITAL_COMMUNITY)
Admission: RE | Admit: 2020-03-07 | Discharge: 2020-03-07 | Disposition: A | Discharge status: Home / Self Care | Payer: Commercial Managed Care - PPO | Source: Ambulatory Visit | Attending: Plastic Surgery | Admitting: Plastic Surgery

## 2020-03-07 ENCOUNTER — Telehealth: Payer: Self-pay

## 2020-03-07 DIAGNOSIS — Z01812 Encounter for preprocedural laboratory examination: Secondary | ICD-10-CM | POA: Insufficient documentation

## 2020-03-07 DIAGNOSIS — Z20822 Contact with and (suspected) exposure to covid-19: Secondary | ICD-10-CM | POA: Diagnosis not present

## 2020-03-07 LAB — SARS CORONAVIRUS 2 (TAT 6-24 HRS): SARS Coronavirus 2: NEGATIVE

## 2020-03-07 NOTE — Telephone Encounter (Signed)
LM for Joy Reyes that the CA-125 was WNL at 10.2 yesterday. Normal range is 0-38.1.  If she has any questions, she can call back to the office at [phone].

## 2020-03-09 ENCOUNTER — Encounter (HOSPITAL_BASED_OUTPATIENT_CLINIC_OR_DEPARTMENT_OTHER): Payer: Self-pay | Admitting: Plastic Surgery

## 2020-03-09 NOTE — Anesthesia Preprocedure Evaluation (Addendum)
Anesthesia Evaluation  Patient identified by MRN, date of birth, ID band Patient awake    Reviewed: Allergy & Precautions, NPO status , Patient's Chart, lab work & pertinent test results  History of Anesthesia Complications (+) PONV and history of anesthetic complications  Airway Mallampati: II  TM Distance: >3 FB Neck ROM: Full    Dental no notable dental hx. (+) Teeth Intact   Pulmonary neg pulmonary ROS,    Pulmonary exam normal breath sounds clear to auscultation       Cardiovascular negative cardio ROS Normal cardiovascular exam Rhythm:Regular Rate:Normal     Neuro/Psych negative neurological ROS     GI/Hepatic negative GI ROS, Neg liver ROS,   Endo/Other  Bilateral breast Ca S/P bilateral mastectomies with Left axillary LND S/P Chemo Rx  Renal/GU negative Renal ROS  negative genitourinary   Musculoskeletal negative musculoskeletal ROS (+)   Abdominal   Peds  Hematology negative hematology ROS (+) anemia ,   Anesthesia Other Findings   Reproductive/Obstetrics (+) Pregnancy                            Anesthesia Physical Anesthesia Plan  ASA: II  Anesthesia Plan: General   Post-op Pain Management:    Induction: Intravenous  PONV Risk Score and Plan: 4 or greater and Scopolamine patch - Pre-op, Midazolam, Ondansetron, Dexamethasone and Treatment may vary due to age or medical condition  Airway Management Planned: Oral ETT  Additional Equipment: None  Intra-op Plan:   Post-operative Plan: Extubation in OR  Informed Consent: I have reviewed the patients History and Physical, chart, labs and discussed the procedure including the risks, benefits and alternatives for the proposed anesthesia with the patient or authorized representative who has indicated his/her understanding and acceptance.     Dental advisory given  Plan Discussed with: CRNA and  Anesthesiologist  Anesthesia Plan Comments:        Anesthesia Quick Evaluation

## 2020-03-11 ENCOUNTER — Other Ambulatory Visit: Payer: Self-pay

## 2020-03-11 ENCOUNTER — Encounter (HOSPITAL_BASED_OUTPATIENT_CLINIC_OR_DEPARTMENT_OTHER): Payer: Self-pay | Admitting: Plastic Surgery

## 2020-03-11 ENCOUNTER — Encounter (HOSPITAL_BASED_OUTPATIENT_CLINIC_OR_DEPARTMENT_OTHER)
Admission: RE | Disposition: A | Discharge status: Home / Self Care | Payer: Self-pay | Source: Home / Self Care | Attending: Plastic Surgery

## 2020-03-11 ENCOUNTER — Ambulatory Visit (HOSPITAL_BASED_OUTPATIENT_CLINIC_OR_DEPARTMENT_OTHER)
Admission: RE | Admit: 2020-03-11 | Discharge: 2020-03-11 | Disposition: A | Discharge status: Home / Self Care | Payer: Commercial Managed Care - PPO | Attending: Plastic Surgery | Admitting: Plastic Surgery

## 2020-03-11 ENCOUNTER — Ambulatory Visit (HOSPITAL_BASED_OUTPATIENT_CLINIC_OR_DEPARTMENT_OTHER): Payer: Commercial Managed Care - PPO | Admitting: Anesthesiology

## 2020-03-11 DIAGNOSIS — Z9013 Acquired absence of bilateral breasts and nipples: Secondary | ICD-10-CM | POA: Insufficient documentation

## 2020-03-11 DIAGNOSIS — Z9221 Personal history of antineoplastic chemotherapy: Secondary | ICD-10-CM | POA: Diagnosis not present

## 2020-03-11 DIAGNOSIS — C50412 Malignant neoplasm of upper-outer quadrant of left female breast: Secondary | ICD-10-CM | POA: Insufficient documentation

## 2020-03-11 DIAGNOSIS — Z171 Estrogen receptor negative status [ER-]: Secondary | ICD-10-CM | POA: Diagnosis not present

## 2020-03-11 DIAGNOSIS — Z853 Personal history of malignant neoplasm of breast: Secondary | ICD-10-CM | POA: Diagnosis not present

## 2020-03-11 DIAGNOSIS — Z45812 Encounter for adjustment or removal of left breast implant: Secondary | ICD-10-CM | POA: Diagnosis present

## 2020-03-11 DIAGNOSIS — Z45811 Encounter for adjustment or removal of right breast implant: Secondary | ICD-10-CM | POA: Diagnosis not present

## 2020-03-11 HISTORY — PX: REMOVAL OF TISSUE EXPANDER AND PLACEMENT OF IMPLANT: SHX6457

## 2020-03-11 LAB — POCT PREGNANCY, URINE: Preg Test, Ur: NEGATIVE

## 2020-03-11 SURGERY — REMOVAL, TISSUE EXPANDER, BREAST, WITH IMPLANT INSERTION
Anesthesia: General | Site: Breast | Laterality: Bilateral

## 2020-03-11 MED ORDER — ONDANSETRON HCL 4 MG/2ML IJ SOLN
INTRAMUSCULAR | Status: AC
  Filled 2020-03-11: qty 2

## 2020-03-11 MED ORDER — LIDOCAINE 2% (20 MG/ML) 5 ML SYRINGE
INTRAMUSCULAR | Status: AC
  Filled 2020-03-11: qty 5

## 2020-03-11 MED ORDER — BUPIVACAINE HCL (PF) 0.5 % IJ SOLN
INTRAMUSCULAR | Status: AC
  Filled 2020-03-11: qty 30

## 2020-03-11 MED ORDER — LIDOCAINE HCL (CARDIAC) PF 100 MG/5ML IV SOSY
PREFILLED_SYRINGE | INTRAVENOUS | Status: DC | PRN
  Administered 2020-03-11: 60 mg via INTRAVENOUS

## 2020-03-11 MED ORDER — ACETAMINOPHEN 500 MG PO TABS
ORAL_TABLET | ORAL | Status: AC
  Filled 2020-03-11: qty 2

## 2020-03-11 MED ORDER — OXYCODONE HCL 5 MG PO TABS: 5.0000 mg | ORAL_TABLET | Freq: Once | ORAL | Status: DC | PRN

## 2020-03-11 MED ORDER — ONDANSETRON HCL 4 MG/2ML IJ SOLN
INTRAMUSCULAR | Status: DC | PRN
  Administered 2020-03-11: 4 mg via INTRAVENOUS

## 2020-03-11 MED ORDER — PROPOFOL 10 MG/ML IV BOLUS
INTRAVENOUS | Status: DC | PRN
  Administered 2020-03-11: 160 mg via INTRAVENOUS

## 2020-03-11 MED ORDER — FENTANYL CITRATE (PF) 100 MCG/2ML IJ SOLN
INTRAMUSCULAR | Status: DC | PRN
  Administered 2020-03-11: 100 ug via INTRAVENOUS
  Administered 2020-03-11: 50 ug via INTRAVENOUS

## 2020-03-11 MED ORDER — DIPHENHYDRAMINE HCL 50 MG/ML IJ SOLN
INTRAMUSCULAR | Status: DC | PRN
  Administered 2020-03-11: 12.5 mg via INTRAVENOUS

## 2020-03-11 MED ORDER — CHLORHEXIDINE GLUCONATE CLOTH 2 % EX PADS: 6.0000 | MEDICATED_PAD | Freq: Once | CUTANEOUS | Status: DC

## 2020-03-11 MED ORDER — CELECOXIB 200 MG PO CAPS
200.0000 mg | ORAL_CAPSULE | ORAL | Status: AC
  Administered 2020-03-11: 200 mg via ORAL

## 2020-03-11 MED ORDER — CEFAZOLIN SODIUM-DEXTROSE 2-4 GM/100ML-% IV SOLN
2.0000 g | INTRAVENOUS | Status: AC
  Administered 2020-03-11: 2 g via INTRAVENOUS

## 2020-03-11 MED ORDER — SODIUM CHLORIDE 0.9 % IV SOLN
INTRAVENOUS | Status: AC
  Filled 2020-03-11: qty 10

## 2020-03-11 MED ORDER — FENTANYL CITRATE (PF) 100 MCG/2ML IJ SOLN
INTRAMUSCULAR | Status: AC
  Filled 2020-03-11: qty 2

## 2020-03-11 MED ORDER — ONDANSETRON HCL 4 MG/2ML IJ SOLN: 4.0000 mg | Freq: Once | INTRAMUSCULAR | Status: DC | PRN

## 2020-03-11 MED ORDER — PROPOFOL 500 MG/50ML IV EMUL
INTRAVENOUS | Status: AC
  Filled 2020-03-11: qty 50

## 2020-03-11 MED ORDER — SCOPOLAMINE 1 MG/3DAYS TD PT72
1.0000 | MEDICATED_PATCH | TRANSDERMAL | Status: DC
  Administered 2020-03-11: 1.5 mg via TRANSDERMAL

## 2020-03-11 MED ORDER — MIDAZOLAM HCL 2 MG/2ML IJ SOLN
INTRAMUSCULAR | Status: AC
  Filled 2020-03-11: qty 2

## 2020-03-11 MED ORDER — CELECOXIB 200 MG PO CAPS
ORAL_CAPSULE | ORAL | Status: AC
  Filled 2020-03-11: qty 1

## 2020-03-11 MED ORDER — DEXAMETHASONE SODIUM PHOSPHATE 10 MG/ML IJ SOLN
INTRAMUSCULAR | Status: AC
  Filled 2020-03-11: qty 1

## 2020-03-11 MED ORDER — SODIUM CHLORIDE 0.9 % IV SOLN
INTRAVENOUS | Status: DC | PRN
  Administered 2020-03-11: 500 mL

## 2020-03-11 MED ORDER — GABAPENTIN 300 MG PO CAPS
ORAL_CAPSULE | ORAL | Status: AC
  Filled 2020-03-11: qty 1

## 2020-03-11 MED ORDER — LACTATED RINGERS IV SOLN: INTRAVENOUS | Status: DC

## 2020-03-11 MED ORDER — OXYCODONE HCL 5 MG/5ML PO SOLN: 5.0000 mg | Freq: Once | ORAL | Status: DC | PRN

## 2020-03-11 MED ORDER — MIDAZOLAM HCL 5 MG/5ML IJ SOLN
INTRAMUSCULAR | Status: DC | PRN
  Administered 2020-03-11: 2 mg via INTRAVENOUS

## 2020-03-11 MED ORDER — ROCURONIUM BROMIDE 100 MG/10ML IV SOLN
INTRAVENOUS | Status: DC | PRN
  Administered 2020-03-11: 20 mg via INTRAVENOUS
  Administered 2020-03-11: 60 mg via INTRAVENOUS

## 2020-03-11 MED ORDER — GABAPENTIN 300 MG PO CAPS
300.0000 mg | ORAL_CAPSULE | ORAL | Status: AC
  Administered 2020-03-11: 300 mg via ORAL

## 2020-03-11 MED ORDER — HYDROMORPHONE HCL 1 MG/ML IJ SOLN: 0.2500 mg | INTRAMUSCULAR | Status: DC | PRN

## 2020-03-11 MED ORDER — POVIDONE-IODINE 10 % EX SOLN
CUTANEOUS | Status: DC | PRN
  Administered 2020-03-11: 1 via TOPICAL

## 2020-03-11 MED ORDER — PROPOFOL 500 MG/50ML IV EMUL
INTRAVENOUS | Status: DC | PRN
  Administered 2020-03-11: 25 ug/kg/min via INTRAVENOUS

## 2020-03-11 MED ORDER — CEFAZOLIN SODIUM-DEXTROSE 2-4 GM/100ML-% IV SOLN
INTRAVENOUS | Status: AC
  Filled 2020-03-11: qty 100

## 2020-03-11 MED ORDER — ROCURONIUM BROMIDE 10 MG/ML (PF) SYRINGE
PREFILLED_SYRINGE | INTRAVENOUS | Status: AC
  Filled 2020-03-11: qty 10

## 2020-03-11 MED ORDER — SUGAMMADEX SODIUM 200 MG/2ML IV SOLN
INTRAVENOUS | Status: DC | PRN
  Administered 2020-03-11: 200 mg via INTRAVENOUS

## 2020-03-11 MED ORDER — DEXAMETHASONE SODIUM PHOSPHATE 4 MG/ML IJ SOLN
INTRAMUSCULAR | Status: DC | PRN
  Administered 2020-03-11: 4 mg via INTRAVENOUS

## 2020-03-11 MED ORDER — ACETAMINOPHEN 500 MG PO TABS
1000.0000 mg | ORAL_TABLET | ORAL | Status: AC
  Administered 2020-03-11: 1000 mg via ORAL

## 2020-03-11 MED ORDER — SCOPOLAMINE 1 MG/3DAYS TD PT72
MEDICATED_PATCH | TRANSDERMAL | Status: AC
  Filled 2020-03-11: qty 1

## 2020-03-11 SURGICAL SUPPLY — 69 items
ADH SKN CLS APL DERMABOND .7 (GAUZE/BANDAGES/DRESSINGS) ×2
APL PRP STRL LF DISP 70% ISPRP (MISCELLANEOUS) ×3
BAG DECANTER FOR FLEXI CONT (MISCELLANEOUS) ×2 IMPLANT
BINDER BREAST LRG (GAUZE/BANDAGES/DRESSINGS) ×2 IMPLANT
BINDER BREAST MEDIUM (GAUZE/BANDAGES/DRESSINGS) IMPLANT
BINDER BREAST XLRG (GAUZE/BANDAGES/DRESSINGS) IMPLANT
BINDER BREAST XXLRG (GAUZE/BANDAGES/DRESSINGS) IMPLANT
BLADE SURG 10 STRL SS (BLADE) ×2 IMPLANT
BLADE SURG 15 STRL LF DISP TIS (BLADE) IMPLANT
BLADE SURG 15 STRL SS (BLADE)
BNDG GAUZE ELAST 4 BULKY (GAUZE/BANDAGES/DRESSINGS) ×4 IMPLANT
CANISTER SUCT 1200ML W/VALVE (MISCELLANEOUS) ×2 IMPLANT
CHLORAPREP W/TINT 26 (MISCELLANEOUS) ×6 IMPLANT
COVER BACK TABLE 60X90IN (DRAPES) ×2 IMPLANT
COVER MAYO STAND STRL (DRAPES) ×2 IMPLANT
COVER WAND RF STERILE (DRAPES) IMPLANT
DECANTER SPIKE VIAL GLASS SM (MISCELLANEOUS) IMPLANT
DERMABOND ADVANCED (GAUZE/BANDAGES/DRESSINGS) ×2
DERMABOND ADVANCED .7 DNX12 (GAUZE/BANDAGES/DRESSINGS) ×2 IMPLANT
DRAIN CHANNEL 15F RND FF W/TCR (WOUND CARE) IMPLANT
DRAPE TOP ARMCOVERS (MISCELLANEOUS) ×2 IMPLANT
DRAPE U-SHAPE 76X120 STRL (DRAPES) ×2 IMPLANT
DRAPE UTILITY XL STRL (DRAPES) ×2 IMPLANT
DRSG PAD ABDOMINAL 8X10 ST (GAUZE/BANDAGES/DRESSINGS) ×4 IMPLANT
ELECT BLADE 4.0 EZ CLEAN MEGAD (MISCELLANEOUS) ×2
ELECT COATED BLADE 2.86 ST (ELECTRODE) ×2 IMPLANT
ELECT REM PT RETURN 9FT ADLT (ELECTROSURGICAL) ×2
ELECTRODE BLDE 4.0 EZ CLN MEGD (MISCELLANEOUS) ×1 IMPLANT
ELECTRODE REM PT RTRN 9FT ADLT (ELECTROSURGICAL) ×1 IMPLANT
EVACUATOR SILICONE 100CC (DRAIN) IMPLANT
GLOVE SURG ENC MOIS LTX SZ6 (GLOVE) ×4 IMPLANT
GOWN STRL REUS W/ TWL LRG LVL3 (GOWN DISPOSABLE) ×2 IMPLANT
GOWN STRL REUS W/TWL LRG LVL3 (GOWN DISPOSABLE) ×4
IMPL BREAST SILICONE 335CC (Breast) ×2 IMPLANT
IMPLANT BREAST SILICONE 335CC (Breast) ×4 IMPLANT
IV NS 1000ML (IV SOLUTION)
IV NS 1000ML BAXH (IV SOLUTION) IMPLANT
IV NS 500ML (IV SOLUTION)
IV NS 500ML BAXH (IV SOLUTION) IMPLANT
KIT FILL SYSTEM UNIVERSAL (SET/KITS/TRAYS/PACK) IMPLANT
MARKER SKIN DUAL TIP RULER LAB (MISCELLANEOUS) IMPLANT
NEEDLE FILTER BLUNT 18X 1/2SAF (NEEDLE)
NEEDLE FILTER BLUNT 18X1 1/2 (NEEDLE) IMPLANT
NEEDLE HYPO 25X1 1.5 SAFETY (NEEDLE) IMPLANT
PACK BASIN DAY SURGERY FS (CUSTOM PROCEDURE TRAY) ×2 IMPLANT
PENCIL SMOKE EVACUATOR (MISCELLANEOUS) ×2 IMPLANT
PIN SAFETY STERILE (MISCELLANEOUS) IMPLANT
SHEET MEDIUM DRAPE 40X70 STRL (DRAPES) ×4 IMPLANT
SIZER BREAST REUSE 335CC (SIZER) ×2
SIZER BRST REUSE P4.8X 335CC (SIZER) ×1 IMPLANT
SLEEVE SCD COMPRESS KNEE MED (MISCELLANEOUS) ×2 IMPLANT
SPONGE LAP 18X18 RF (DISPOSABLE) ×4 IMPLANT
STAPLER VISISTAT 35W (STAPLE) ×2 IMPLANT
SUT ETHILON 2 0 FS 18 (SUTURE) IMPLANT
SUT MNCRL AB 4-0 PS2 18 (SUTURE) ×4 IMPLANT
SUT PDS AB 2-0 CT2 27 (SUTURE) IMPLANT
SUT SILK 2 0 SH (SUTURE) IMPLANT
SUT VIC AB 3-0 PS1 18 (SUTURE)
SUT VIC AB 3-0 PS1 18XBRD (SUTURE) IMPLANT
SUT VIC AB 3-0 SH 27 (SUTURE) ×4
SUT VIC AB 3-0 SH 27X BRD (SUTURE) ×2 IMPLANT
SUT VICRYL 4-0 PS2 18IN ABS (SUTURE) ×4 IMPLANT
SYR 20ML LL LF (SYRINGE) IMPLANT
SYR BULB IRRIG 60ML STRL (SYRINGE) ×4 IMPLANT
SYR CONTROL 10ML LL (SYRINGE) ×2 IMPLANT
TOWEL GREEN STERILE FF (TOWEL DISPOSABLE) ×4 IMPLANT
TUBE CONNECTING 20X1/4 (TUBING) ×4 IMPLANT
UNDERPAD 30X36 HEAVY ABSORB (UNDERPADS AND DIAPERS) ×4 IMPLANT
YANKAUER SUCT BULB TIP NO VENT (SUCTIONS) ×2 IMPLANT

## 2020-03-11 NOTE — Anesthesia Postprocedure Evaluation (Signed)
Anesthesia Post Note  Patient: Joy Reyes  Procedure(s) Performed: REMOVAL OF TISSUE EXPANDER AND PLACEMENT OF SILICONE IMPLANT (Bilateral Breast)     Patient location during evaluation: PACU Anesthesia Type: General Level of consciousness: awake and alert and oriented Pain management: pain level controlled Vital Signs Assessment: post-procedure vital signs reviewed and stable Respiratory status: spontaneous breathing, nonlabored ventilation and respiratory function stable Cardiovascular status: blood pressure returned to baseline and stable Postop Assessment: no apparent nausea or vomiting Anesthetic complications: no   No complications documented.  Last Vitals:  Vitals:   03/11/20 0838 03/11/20 0845  BP: 110/71 108/87  Pulse: 86 85  Resp: 15 12  Temp: 36.7 C   SpO2: 100% 100%    Last Pain:  Vitals:   03/11/20 0845  TempSrc:   PainSc: 0-No pain                 Michale Weikel A.

## 2020-03-11 NOTE — H&P (Signed)
Subjective:   Patient ID: Joy Reyes is a 35 y.o. female.  HPI  3.5 months post op bilateral mastectomies with immediate expander based reconstruction. Presents for implant exchange. Met with Dr. Lanell Persons and PMRT offered. Also met with Dr. Jana Hakim and plan olaparib starting tentatively Feb 2022, next appt with Dr. Jana Hakim scheduled 1.27.22  History of RIGHT breast cancer, diagnosed in 2012 while she lived in [place], [place], and treated in [place]. Per patient underwent neoadjuvant chemotherapy followed by lumpectomy. Denies history LN sampling or radiation. In 2021, palpated change left breast. Diagnostic MMG showed asymmetry left UOQ. US showed a 2.8 cm mass, a 1.8 cm axillary left LN with cortical thickening. Biopsy showed IDC with DCIS, ER/PR-, Her2-. The left axillary lymph node biopsy was non diagnostic.  MRI demonstrated known LEFT UOQ malignancy 2.8 x 3.0 x 2.7 cm. An indeterminate 5 mm mass in the medial left breast at 9 o'clock noted. An indeterminate 5.3 mm mass in the superior RIGHT breast between 12 and 1 o'clock noted. The previously biopsied left axillary LN was mildly prominent. No other evidence of adenopathy.  MR guided biopsy RIGHT breast scheduled- at time of this no mass noted on imagine. Repeat biopsy US guided left axillary LN showed fibroadipose tissue, no lymphoid tissue present, felt to be discordant and plan removal LN at time surgery.   Completed neoadjuvant chemotherapy. Final MRI showed no residual mass is identified in the superior left breast. Mild NME superior to the biopsy clip left UOQ noted, spanning 9 mm. Stable appearance of axillary lymph nodes. A left axillary LN with biopsy clip artifact measured 6 mm diameter. No MRI evidence of malignancy in the right breast.  Final pathology no residual invasive ca, 0/1 SLN.  Plan to start olaparib postop, to be continued for 1 year.  Diagnosed with BRCA1 at time of 2012 right breast ca.   Prior B cup happy  with this. Right mastectomy 196 g Left 174 g  Pt works as a Music therapist. Her husband works for an Engineer, structural (the [employer]) out of [place]. Has young twins in addition to 3 teenagers at home.  Review of Systems    Objective:  Physical Exam  Cardiovascular: Normal rate, regular rhythm and normal heart sounds.  Pulmonary/Chest: Effort normal and breath sounds normal.   Chest:soft bilateral chest expanded scars maturing superior pole rippling noted  Abd small volume for liposuction Thighs: lateral hips with sufficient volume for liposuction  Assessment:   History right breast cancer, s/p chemotherapy, lumpectomy BRCA2 Left breast ca UOQ ER- Neoadjuvant chemotherapy S/p bilateral areola sparing mastectomies, left SLN, bilateral prepectoral TE/ADM (Alloderm) reconstruction  Plan:   Pictures today. Patient declines RT at this time and plan proceed with implant exchange.  Plan removal bilateral TE and placement silicone implants.  Reviewed saline vs silicone, smooth v textured, shaped v round. As in prepectoral position I do recommend HCG or capacity filled silicone implants to reduce risk visible rippling. Reviewed MRI or USsurveillance for rupture with silicone implants.Reviewed examples for 4th generation, capacity filled 4th generation, and HCG implants vs saline implants. Reviewed risks AP flipping that may be more noticeable with 5th generation implants, may require surgery to correct.Discussed risk ALCL with textured devices. Patient has elected for silicone implants, plan smooth round.  Reviewed sizing in part based on chest width, cannot assure her cup size.  Discussed purpose fat grafting to thicken flaps, reduce visible rippling. Reviewed variable take graft, may need to repeat, fat necrosis that presents  as masses, abdominal incisions, pain need for compression.Patient has decided to defer fat grafting at this time. Reviewed this can be done in future if desired  and/or significant rippling.  Patient given [facility] patient brochure and completed physician patient check list.  Rx for Bactrim given. Patient has pain medication at home.  Natrelle 133S FV-11-T 300 ml tissue expanders placed bilateral,  RIGHT fill volume 260 ml saline LEFT fill volume 260 ml saline

## 2020-03-11 NOTE — Op Note (Signed)
Operative Note   DATE OF OPERATION: 1.18.22  LOCATION: [facility]-outpatient  SURGICAL DIVISION: Plastic Surgery  PREOPERATIVE DIAGNOSES:  1. History breast cancer 2. Acquired absence breasts 3. BRCA1  POSTOPERATIVE DIAGNOSES:  same  PROCEDURE:  Removal bilateral chest tissue expanders and placement silicone implants  SURGEON: [name] MD MBA  ASSISTANT: none  ANESTHESIA:  General.   EBL: 10 ml  COMPLICATIONS: None immediate.   INDICATIONS FOR PROCEDURE:  The patient, Joy Reyes, is a 35 y.o. female born on March 03, 1985, is here for staged breast reconstruction following bilateral areola sparing mastectomies with immediate prepectoral expander based reconstruction.   FINDINGS: Complete incorporation ADM noted bilateral. Natrelle Inspira Full Projection 335 ml implants placed bilateral. REF SRF-335 RIGHT SN [id number] LEFT SN [id number]  DESCRIPTION OF PROCEDURE:  The patient's operative site was marked with the patient in the preoperative area. The patient was taken to the operating room. SCDs were placed and IV antibiotics were given. The patient's operative site was prepped and draped in a sterile fashion. A time out was performed and all information was confirmed to be correct.  Incision made in left inframammary fold scar and carried through superficial fascia and acellular dermis. Expander removed. Sizer placed. I then directed attention to right chest. Similar incision made in right inframammary fold scar and carried through superficial fascia and acellular dermis. Expander removed. Patient brought to upright sitting position. Full projection 335 ml implants selected bilateral placement. Patient returned to supine position. Each cavity irrigated with saline solution containing Ancef, gentamicin, and Betadine. Implant placed right breast cavity. Orientation ensured. Closure completed with 3-0 vicryl in superficial fascia and acellular dermis. 4-0 vicryl used to  approximate dermis and 4-0 monocryl subcuticular skin closure completed. Over left chest, implant placed. Closure completed in similar fashion. Dermabond applied. Dry dressing and breast binder applied.   The patient was allowed to wake from anesthesia, extubated and taken to the recovery room in satisfactory condition.   SPECIMENS: none  DRAINS: none

## 2020-03-11 NOTE — Discharge Instructions (Signed)
  Post Anesthesia Home Care Instructions  Activity: Get plenty of rest for the remainder of the day. A responsible individual must stay with you for 24 hours following the procedure.  For the next 24 hours, DO NOT: -Drive a car -Paediatric nurse -Drink alcoholic beverages -Take any medication unless instructed by your physician -Make any legal decisions or sign important papers.  Meals: Start with liquid foods such as gelatin or soup. Progress to regular foods as tolerated. Avoid greasy, spicy, heavy foods. If nausea and/or vomiting occur, drink only clear liquids until the nausea and/or vomiting subsides. Call your physician if vomiting continues.  Special Instructions/Symptoms: Your throat may feel dry or sore from the anesthesia or the breathing tube placed in your throat during surgery. If this causes discomfort, gargle with warm salt water. The discomfort should disappear within 24 hours.  If you had a scopolamine patch placed behind your ear for the management of post- operative nausea and/or vomiting:  1. The medication in the patch is effective for 72 hours, after which it should be removed.  Wrap patch in a tissue and discard in the trash. Wash hands thoroughly with soap and water. 2. You may remove the patch earlier than 72 hours if you experience unpleasant side effects which may include dry mouth, dizziness or visual disturbances. 3. Avoid touching the patch. Wash your hands with soap and water after contact with the patch.  No Tylenol or Ibuprofen until 1:30PM.

## 2020-03-11 NOTE — Anesthesia Procedure Notes (Signed)
Procedure Name: Intubation Date/Time: 03/11/2020 7:27 AM Performed by: Glory Buff, CRNA Pre-anesthesia Checklist: Patient identified, Emergency Drugs available, Suction available and Patient being monitored Patient Re-evaluated:Patient Re-evaluated prior to induction Oxygen Delivery Method: Circle system utilized Preoxygenation: Pre-oxygenation with 100% oxygen Induction Type: IV induction Ventilation: Mask ventilation without difficulty Laryngoscope Size: Miller and 3 Grade View: Grade I Tube type: Oral Tube size: 7.0 mm Number of attempts: 1 Airway Equipment and Method: Stylet and Oral airway Placement Confirmation: ETT inserted through vocal cords under direct vision,  positive ETCO2 and breath sounds checked- equal and bilateral Secured at: 21 cm Tube secured with: Tape Dental Injury: Teeth and Oropharynx as per pre-operative assessment

## 2020-03-11 NOTE — Transfer of Care (Signed)
Immediate Anesthesia Transfer of Care Note  Patient: Joy Reyes  Procedure(s) Performed: REMOVAL OF TISSUE EXPANDER AND PLACEMENT OF SILICONE IMPLANT (Bilateral Breast)  Patient Location: PACU  Anesthesia Type:General  Level of Consciousness: drowsy, patient cooperative and responds to stimulation  Airway & Oxygen Therapy: Patient Spontanous Breathing and Patient connected to face mask oxygen  Post-op Assessment: Report given to RN and Post -op Vital signs reviewed and stable  Post vital signs: Reviewed and stable  Last Vitals:  Vitals Value Taken Time  BP 110/71 03/11/20 0838  Temp    Pulse 80 03/11/20 0840  Resp 13 03/11/20 0840  SpO2 100 % 03/11/20 0840  Vitals shown include unvalidated device data.  Last Pain:  Vitals:   03/11/20 0704  TempSrc: Oral  PainSc: 0-No pain         Complications: No complications documented.

## 2020-03-12 ENCOUNTER — Encounter (HOSPITAL_BASED_OUTPATIENT_CLINIC_OR_DEPARTMENT_OTHER): Payer: Self-pay | Admitting: Plastic Surgery

## 2020-03-13 ENCOUNTER — Other Ambulatory Visit: Payer: Self-pay | Admitting: Gynecologic Oncology

## 2020-03-13 ENCOUNTER — Ambulatory Visit (HOSPITAL_COMMUNITY)
Admission: RE | Admit: 2020-03-13 | Discharge: 2020-03-13 | Disposition: A | Discharge status: Home / Self Care | Payer: Commercial Managed Care - PPO | Source: Ambulatory Visit | Attending: Gynecologic Oncology | Admitting: Gynecologic Oncology

## 2020-03-13 ENCOUNTER — Other Ambulatory Visit: Payer: Self-pay

## 2020-03-13 DIAGNOSIS — Z1501 Genetic susceptibility to malignant neoplasm of breast: Secondary | ICD-10-CM

## 2020-03-13 DIAGNOSIS — C50911 Malignant neoplasm of unspecified site of right female breast: Secondary | ICD-10-CM

## 2020-03-13 NOTE — Progress Notes (Signed)
Ordered followup US to monitor bilateral ovarian cysts seen on last [place]. [name]

## 2020-03-14 ENCOUNTER — Telehealth: Payer: Self-pay

## 2020-03-14 NOTE — Telephone Encounter (Signed)
LM for Pt to call back to discuss the results of the [place].

## 2020-03-14 NOTE — Telephone Encounter (Signed)
-----   Message from [name], MD sent at 03/13/2020  1:46 PM EST ----- Regarding: needs repeat transvaginal US in 3 months [name], Can we schedule [name] for a repeat US in 3 months to monitor the small ovarian cysts for stability. Thanks Terrence Dupont  ----- Message ----- From: Buel Ream, Rad Results In Sent: 03/13/2020  11:35 AM EST To: [name], MD

## 2020-03-17 NOTE — Telephone Encounter (Signed)
Told Ms Lapine the results of the US  Showing an area along the margin of the cyst that is likely a peripheral follicle.  The radiologist recommends a follow up US in 8-12 weeks  to monitor cyst for stability given her history. Pt scheduled for Friday 06-13-20 at [facility] at 0830 with 0815 check in. Drink 32 oz of water an hour prior to [place]. Pt verbalized understanding.

## 2020-03-20 ENCOUNTER — Telehealth: Payer: Commercial Managed Care - PPO | Admitting: Oncology

## 2020-04-20 NOTE — Progress Notes (Signed)
[place]  [phone] [phone]     ID: Joy Reyes DOB: 06/18/1985  MR#: [id number]  LXB#:[id number]  Patient Care Team: Pcp, No as PCP - General Aime Carreras, [name], MD as Consulting Physician (Oncology) [name], RN as Oncology Nurse Navigator [name], RN as Oncology Nurse Navigator [name], MD as Consulting Physician (Plastic Surgery) [name], MD as Consulting Physician (General Surgery) [name], MD as Consulting Physician (Gynecologic Oncology) [name], MD as Attending Physician (Radiation Oncology) [name], MD OTHER MD: PCP= Franne Forts MD  I connected with Joy Reyes on 04/21/20 at 11:15 AM EST by video enabled telemedicine visit and verified that I am speaking with the correct person using two identifiers.   I discussed the limitations, risks, security and privacy concerns of performing an evaluation and management service by telemedicine and the availability of in-person appointments. I also discussed with the patient that there may be a patient responsible charge related to this service. The patient expressed understanding and agreed to proceed.   Other persons participating in the visit and their role in the encounter: Patient's husband  Patient's location: Home Provider's location: [place]  Total time spent: 15 min   CHIEF COMPLAINT: BRCA1 positive breast cancer  CURRENT TREATMENT: considering olaparib   INTERVAL HISTORY: Joy "[name]" was contacted today for follow up of her BRCA positive breast cancer.   She started olaparib a few days ago.  She only took it for 1 day.  She had some nausea and decided there was no way she could do a year on this medication.  Since her last visit, she met with Dr. Isidore Moos on 01/22/2020. She ultimately opted to forego radiation therapy.  She also met with Dr. Denman George on 03/05/2020 to discuss bilateral oophorectomy  given her BRCA1 positivity. Dr. Denman George recommended surveillance for now, with TVUS and CA-125 every 6 months and deferring surgery until age 30 d.  She underwent her first pelvic ultrasound on 03/13/2020 showing: hemorrhagic cyst in left ovary; right ovary with small area along margin of cyst that is likely a peripheral follicle. Short-term follow up recommended to confirm benign process. This is scheduled for 06/13/2020.  Finally, she also underwent breast reconstruction (implant placement) on 03/11/2020 under Dr. [name].  She tells me the surgery went well, with no significant problems with pain fever or dehiscence.  She has a little bit of erythema in the lateral aspect of the incision in the left breast, but she does not feel this is a major concern and if it persists she will let Dr. [name] know   REVIEW OF SYSTEMS: [name] tells me her hair has come in even more curly than last time.  A detailed review of systems today was otherwise noncontributory   COVID 19 VACCINATION STATUS:     HISTORY OF CURRENT ILLNESS: From the original intake note:  Johann Gascoigne has a history of breast cancer, diagnosed in 2012 while she lived in [place], [place], and treated in [place] under Dr [name].  We are obtaining data from that experience but according to the patient her tumor was the size of an apricot.  She did not have axillary lymph node sampling or radiation.  She remembers 8 neoadjuvant chemotherapy treatments given every 2 weeks with the first including the "red drug", which suggest Cytoxan and Adriamycin in dose dense fashion x4 most likely followed by either Taxol or Taxotere in dose dense fashion x4.  After that experience  and given her breast density and BRCA mutation our practice here would have been a yearly follow-up with MRI in addition to yearly mammography.   More recently [name] herself palpated a change in her right breast.  She brought it to Dr. [name] attention and underwent  bilateral diagnostic mammography with tomography and left breast ultrasonography at [facility] on 06/14/2019 showing: breast density category D; there was an area of asymmetry in the upper outer quadrant of the left breast, and ultrasound of that area showed a 2.8 cm mass.  Ultrasound of the axilla showed 1.8 cm lymph node with cortical thickening.    In the right breast they saw only postsurgical changes, with 2 biopsy clips in the retroareolar area.  Accordingly on 06/19/2019 she proceeded to biopsy of the left breast area in question. 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  The left axillary lymph node biopsy was nondiagnostic, with very spare sampling of fibroadipose and lymphoid tissue.  Estrogen receptor was negative, with less than 1% uptake, progesterone receptor was negative, with less than 1% uptake, HER-2 was negative by immunohistochemistry with a score of 1+.  The patient's subsequent history is as detailed below.   PAST MEDICAL HISTORY: Past Medical History:  Diagnosis Date  . Cancer [facility])    breast cancer 2012  . PONV (postoperative nausea and vomiting)     PAST SURGICAL HISTORY: Past Surgical History:  Procedure Laterality Date  . BREAST RECONSTRUCTION WITH PLACEMENT OF TISSUE EXPANDER AND ALLODERM Bilateral 10/05/20221   BILATERAL BREAST RECONSTRUCTION WITH PLACEMENT OF TISSUE EXPANDER AND ALLODERM (Bilateral Breast)  . BREAST RECONSTRUCTION WITH PLACEMENT OF TISSUE EXPANDER AND ALLODERM Bilateral 11/27/2019   Procedure: BILATERAL BREAST RECONSTRUCTION WITH PLACEMENT OF TISSUE EXPANDER AND ALLODERM;  Surgeon: [name], MD;  Location: [place];  Service: Plastics;  Laterality: Bilateral;  . BREAST SURGERY     lumpectomy on right breast  . CESAREAN SECTION MULTI-GESTATIONAL N/A 06/01/2015   Procedure: CESAREAN SECTION MULTI-GESTATIONAL;  Surgeon: Waymon Amato, MD;  Location: [facility];   Service: Obstetrics;  Laterality: N/A;  . IR IMAGING GUIDED PORT INSERTION  07/05/2019  . LOBECTOMY Right 2012  . MASTECTOMY W/ SENTINEL NODE BIOPSY Bilateral 11/27/2019    BILATERAL MASTECTOMIES WITH LEFT AXILLARY SENTINEL LYMPH NODE BIOPSY (Bilateral Breast)  . MASTECTOMY W/ SENTINEL NODE BIOPSY Bilateral 11/27/2019   Procedure: BILATERAL MASTECTOMIES WITH LEFT AXILLARY SENTINEL LYMPH NODE BIOPSY;  Surgeon: [name], MD;  Location: [facility];  Service: General;  Laterality: Bilateral;  PEC BLOCK, RNFA REQUESTED  . PORT-A-CATH REMOVAL Right 11/27/2019   Procedure: REMOVAL PORT-A-CATH;  Surgeon: [name], MD;  Location: [facility];  Service: General;  Laterality: Right;  . REMOVAL OF TISSUE EXPANDER AND PLACEMENT OF IMPLANT Bilateral 03/11/2020   Procedure: REMOVAL OF TISSUE EXPANDER AND PLACEMENT OF SILICONE IMPLANT;  Surgeon: [name], MD;  Location: [facility];  Service: Plastics;  Laterality: Bilateral;  . SALPINGECTOMY      FAMILY HISTORY: Family History  Problem Relation Age of Onset  . Breast cancer Maternal Grandmother        very late in life, lived to 34  . Breast cancer Paternal Grandmother        died at age 38  . Alzheimer's disease Paternal Grandfather   . Ovarian cancer Neg Hx   . Colon cancer Neg Hx   . Endometrial cancer Neg Hx   . Pancreatic cancer Neg  Hx   . Prostate cancer Neg Hx    The patient's parents are both turning 2 in 2021.  The paternal grandfather died from Alzheimer's disease.  The paternal grandmother died from breast cancer at the age of 23.  The patient's father had 1 sister who had died as a child from meningitis.  He had no brothers.  On the mother side, a maternal grandmother did have breast cancer very late in life, living to be 43.  The paternal grandfather and the only maternal aunt had no history of cancer.  The patient has 1 brother, in good health, no sisters.   GYNECOLOGIC HISTORY:  No LMP recorded. (Menstrual  status: Chemotherapy). Menarche: 35 years old Age at first live birth: 35 years old [place] P twins LMP regular as of May 2021 Contraceptive HRT no  Hysterectomy?  No BSO?  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  Her husband Joy Gosling") works for an NGO (the [employer]) out of [place] which helps small agencies doing research set up and succeed.  He wrote a book, [employer], which sounds very interesting and he writes regularly for the [employer] as well.  Their twins are     years old. [name] has (2?) children from a prior marriage. The patient belongs to an orthodox denomination    ADVANCED DIRECTIVES: In the absence of any documentation to the contrary, the patient's spouse is their HCPOA.    HEALTH MAINTENANCE: Social History   Tobacco Use  . Smoking status: Never Smoker  . Smokeless tobacco: Never Used  Vaping Use  . Vaping Use: Never used  Substance Use Topics  . Alcohol use: Not Currently    Alcohol/week: 4.0 standard drinks    Types: 4 Glasses of wine per week  . Drug use: No     Colonoscopy: n/a (age)  PAP: Up-to-date  Bone density: n/a (age)   Allergies  Allergen Reactions  . Paclitaxel     Anaphylactic per pt history in MD note    Current Outpatient Medications  Medication Sig Dispense Refill  . olaparib (LYNPARZA) 150 MG tablet Take 2 tablets (300 mg total) by mouth 2 (two) times daily. Swallow whole. May take with food to decrease nausea and vomiting. Start 01/02/2020 (Patient not taking: No sig reported) 120 tablet 6   No current facility-administered medications for this visit.    OBJECTIVE:   There were no vitals filed for this visit.   There is no height or weight on file to calculate BMI.   Wt Readings from Last 3 Encounters:  03/11/20 152 lb 1.9 oz (69 kg)  03/05/20 151 lb (68.5 kg)  01/11/20 147 lb 9.6 oz (67 kg)      ECOG FS:1 -  Symptomatic but completely ambulatory  Telemedicine visit 04/21/2020   LAB RESULTS:      Component Value Date/Time   NA 138 11/28/2019 0046   K 3.8 11/28/2019 0046   CL 107 11/28/2019 0046   CO2 22 11/28/2019 0046   GLUCOSE 126 (H) 11/28/2019 0046   BUN 10 11/28/2019 0046   CREATININE 0.78 11/28/2019 0046   CALCIUM 9.0 11/28/2019 0046   PROT 7.2 11/09/2019 1022   ALBUMIN 4.3 11/09/2019 1022   AST 30 11/09/2019 1022   ALT 34 11/09/2019 1022   ALKPHOS 37 (L) 11/09/2019 1022   BILITOT 0.4 11/09/2019 1022   GFRNONAA >60 11/28/2019 0046  GFRAA >60 11/19/2019 1340    No results found for: TOTALPROTELP, ALBUMINELP, A1GS, A2GS, BETS, BETA2SER, GAMS, MSPIKE, SPEI  Lab Results  Component Value Date   WBC 6.2 11/28/2019   NEUTROABS 1.4 (L) 11/09/2019   HGB 10.2 (L) 11/28/2019   HCT 30.8 (L) 11/28/2019   MCV 106.6 (H) 11/28/2019   PLT 163 11/28/2019    No results found for: LABCA2  No components found for: XQJJHE174  No results for input(s): INR in the last 168 hours.  No results found for: LABCA2  No results found for: CAN199  Lab Results  Component Value Date   CAN125 10.2 03/05/2020    No results found for: YCX448  No results found for: CA2729  No components found for: HGQUANT  No results found for: CEA1 / No results found for: CEA1   No results found for: AFPTUMOR  No results found for: CHROMOGRNA  No results found for: KPAFRELGTCHN, LAMBDASER, KAPLAMBRATIO (kappa/lambda light chains)  No results found for: HGBA, HGBA2QUANT, HGBFQUANT, HGBSQUAN (Hemoglobinopathy evaluation)   No results found for: LDH  No results found for: IRON, TIBC, IRONPCTSAT (Iron and TIBC)  No results found for: FERRITIN  Urinalysis No results found for: COLORURINE, APPEARANCEUR, LABSPEC, PHURINE, GLUCOSEU, HGBUR, BILIRUBINUR, KETONESUR, PROTEINUR, UROBILINOGEN, NITRITE, LEUKOCYTESUR   STUDIES: No results found.   ELIGIBLE FOR AVAILABLE RESEARCH PROTOCOL:  no  ASSESSMENT: 35 y.o. BRCA1 positive [facility] woman  (x) BRCA1 positivity:  (a) s/p bilateral salpingectomy 11/28/2013, age 87 (no oophorectomy) with benign pathology [phone], [place], [place])  (b) pelvic ultrasound including transvaginal study 06/01/2019 unremarkable, as was CA 125  (c)  bilateral mastectomies 11/27/2019  (d) pt has frozen eggs at [facility]  (e) considering bilateral oophorectomy    RIGHT BREAST CANCER (1) Status post right breast and axillary node biopsy 2012 for a clinical T2 N0 invasive ductal carcinoma, grade 3, triple negative ([id number])  (a) received neoadjuvant chemotherapy consisting of 4 cycles of doxorubicin and cyclophosphamide, followed by paclitaxel x1 (developed anaphylaxis with the first dose), then 4 doses of docetaxel completed August 2012  (b) right lumpectomy 01/11/2011 with no sentinel lymph node sampling (refused by patient) 01/11/2011 showed no residual malignancy in the breast (ypT0 ypNX)  (c) no adjuvant radiation  LEFT BREAST CANCER (2) Status post left breast upper outer quadrant biopsy 06/19/2019 for a clinical T2 N1, stage IIIB invasive ductal carcinoma, grade 3, triple negative  (a) left axillary lymph node biopsy 07/12/2019 nondiagnostic [discordant]  (b) chest abdomen and pelvis CT scan 07/05/2019 shows small left axillary lymph nodes, 1.5 cm enhancement in the left breast, no internal mammary adenopathy, no evidence of metastatic disease  (c) bone scan 07/13/2019 shows no evidence of bony metastatic disease  (d) biopsy of RIGHT breast mass 07/18/2019 canceled as bilateral mastectomies plan  (3) neoadjuvant chemotherapy consisting of carboplatin day 1, Abraxane days 1,8,15, repeated Q21 days x 4 cycles, starting 07/10/2019, last dose 09/25/2019  (a) final 2 doses of Abraxane omitted secondary to cytopenias and mild neuropathy  (4) status post bilateral skin sparing mastectomies and left axillary sentinel lymph  node sampling 11/27/2019 with pathology showing  (a) on the right, no evidence of cancer  (b) on the left, no residual carcinoma, ypT0 ypN0  (c) a single left axillary lymph node was removed  (5) bilateral tissue expander placement 11/27/2019 with definitive silicone implant placement bilaterally 03/11/2020  (a) Complete incorporation ADM noted bilateral. Natrelle Inspira Full Projection 335 ml implants placed bilateral. REF  [id number] RIGHT SN [id number] LEFT SN [id number]  (6) patient opted against adjuvant radiation  (7) started OLAPARIB February 2022, discontinued after 1 day with side effects  (a) olaparib 100 mg at bedtime started 04/28/2020    (8) bilateral oophorectomy at age 33 planned   PLAN: Maggie had an excellent result from her chemotherapy, and tolerated her reconstruction well.  She is very pleased with the cosmetic results.  If she continues to have a slight erythema associated with the area she mentions she will bring it up to Dr. Para Skeans attention.  She was not able to tolerate her left rib.  I suggested she try 1 tablet at bedtime and see if she can tolerate that.  She will start that next week.  I am going to see her in May and if she is able to tolerate that we might be able to increase it to 2 tablets at bedtime.  Otherwise she will go under observation alone.  She is planning on bilateral salpingo-oophorectomy at age 64 and I think that is fine.  In the meantime she is going through screening studies.  She understands these have not been validated in terms of actually saving lives or even finding the ovarian cancer at an earlier stage and they do produce false positives which is what is most likely happening at this point.  She is scheduled for repeat ultrasound in April  She knows to call for any other issue that may develop before the next visit.    [name]. Clearence Vitug, MD 04/21/20 11:13 AM Medical Oncology and Hematology [facility] [place], [place] [address] Tel. [phone]    Fax. [phone]   I, Wilburn Mylar, am acting as scribe for Dr. [name]. Quina Wilbourne.  I, Lurline Del MD, have reviewed the above documentation for accuracy and completeness, and I agree with the above.   *Total Encounter Time as defined by the Centers for Medicare and Medicaid Services includes, in addition to the face-to-face time of a patient visit (documented in the note above) non-face-to-face time: obtaining and reviewing outside history, ordering and reviewing medications, tests or procedures, care coordination (communications with other health care professionals or caregivers) and documentation in the medical record.

## 2020-04-21 ENCOUNTER — Inpatient Hospital Stay: Payer: Commercial Managed Care - PPO | Attending: Oncology | Admitting: Oncology

## 2020-04-21 DIAGNOSIS — C50911 Malignant neoplasm of unspecified site of right female breast: Secondary | ICD-10-CM | POA: Diagnosis not present

## 2020-04-21 DIAGNOSIS — Z9013 Acquired absence of bilateral breasts and nipples: Secondary | ICD-10-CM | POA: Diagnosis not present

## 2020-04-21 DIAGNOSIS — G629 Polyneuropathy, unspecified: Secondary | ICD-10-CM | POA: Insufficient documentation

## 2020-04-21 DIAGNOSIS — Z9079 Acquired absence of other genital organ(s): Secondary | ICD-10-CM | POA: Insufficient documentation

## 2020-04-21 DIAGNOSIS — Z803 Family history of malignant neoplasm of breast: Secondary | ICD-10-CM | POA: Insufficient documentation

## 2020-04-21 DIAGNOSIS — Z818 Family history of other mental and behavioral disorders: Secondary | ICD-10-CM | POA: Diagnosis not present

## 2020-04-21 DIAGNOSIS — R110 Nausea: Secondary | ICD-10-CM | POA: Insufficient documentation

## 2020-04-21 DIAGNOSIS — Z171 Estrogen receptor negative status [ER-]: Secondary | ICD-10-CM | POA: Diagnosis not present

## 2020-04-21 DIAGNOSIS — C50412 Malignant neoplasm of upper-outer quadrant of left female breast: Secondary | ICD-10-CM | POA: Diagnosis present

## 2020-04-21 DIAGNOSIS — Z79899 Other long term (current) drug therapy: Secondary | ICD-10-CM | POA: Diagnosis not present

## 2020-04-21 DIAGNOSIS — C50211 Malignant neoplasm of upper-inner quadrant of right female breast: Secondary | ICD-10-CM | POA: Diagnosis not present

## 2020-04-21 DIAGNOSIS — Z1509 Genetic susceptibility to other malignant neoplasm: Secondary | ICD-10-CM | POA: Insufficient documentation

## 2020-04-21 DIAGNOSIS — Z1501 Genetic susceptibility to malignant neoplasm of breast: Secondary | ICD-10-CM

## 2020-04-21 DIAGNOSIS — Z853 Personal history of malignant neoplasm of breast: Secondary | ICD-10-CM | POA: Diagnosis not present

## 2020-04-21 DIAGNOSIS — Z1502 Genetic susceptibility to malignant neoplasm of ovary: Secondary | ICD-10-CM | POA: Diagnosis not present

## 2020-06-13 ENCOUNTER — Ambulatory Visit (HOSPITAL_COMMUNITY)
Admission: RE | Admit: 2020-06-13 | Discharge: 2020-06-13 | Disposition: A | Discharge status: Home / Self Care | Payer: Commercial Managed Care - PPO | Source: Ambulatory Visit | Attending: Gynecologic Oncology | Admitting: Gynecologic Oncology

## 2020-06-13 ENCOUNTER — Other Ambulatory Visit: Payer: Self-pay

## 2020-06-13 DIAGNOSIS — C50911 Malignant neoplasm of unspecified site of right female breast: Secondary | ICD-10-CM | POA: Diagnosis not present

## 2020-06-13 DIAGNOSIS — Z1501 Genetic susceptibility to malignant neoplasm of breast: Secondary | ICD-10-CM | POA: Insufficient documentation

## 2020-06-16 ENCOUNTER — Telehealth: Payer: Self-pay

## 2020-06-17 NOTE — Telephone Encounter (Signed)
Told Ms Petras that that the [place] was normal per Dr. Denman George. She needs to have another follow up US,CA-125, and office visit ~09-02-20. Dr Serita Grit scheduler will call her to schedule these appointments closer to September 02, 2020.  Pt verbalized understanding. Told her to call at the end of June if she she has notfrom Dr. Serita Grit office.

## 2020-08-13 ENCOUNTER — Telehealth: Payer: Self-pay | Admitting: *Deleted

## 2020-08-13 NOTE — Telephone Encounter (Signed)
Per Dr Denman George scheduled an [place] scan, lab and MD visit for the patient. Attempted to reach the patient with no answer and unable to leave a message.Sent a my chart message

## 2020-08-14 ENCOUNTER — Telehealth: Payer: Self-pay | Admitting: *Deleted

## 2020-08-14 NOTE — Telephone Encounter (Signed)
Called and left the patient a message to call the office back regarding her appts, or she can find them in my chart

## 2020-08-15 ENCOUNTER — Telehealth: Payer: Self-pay | Admitting: *Deleted

## 2020-08-15 NOTE — Telephone Encounter (Signed)
Spoke with the patient and gave upcoming appts

## 2020-10-21 ENCOUNTER — Ambulatory Visit (HOSPITAL_COMMUNITY)
Admission: RE | Admit: 2020-10-21 | Discharge: 2020-10-21 | Disposition: A | Discharge status: Home / Self Care | Payer: Commercial Managed Care - PPO | Source: Ambulatory Visit | Attending: Gynecologic Oncology | Admitting: Gynecologic Oncology

## 2020-10-21 ENCOUNTER — Other Ambulatory Visit: Payer: Self-pay

## 2020-10-21 DIAGNOSIS — Z1501 Genetic susceptibility to malignant neoplasm of breast: Secondary | ICD-10-CM | POA: Diagnosis present

## 2020-10-21 DIAGNOSIS — C50911 Malignant neoplasm of unspecified site of right female breast: Secondary | ICD-10-CM | POA: Diagnosis present

## 2020-10-24 ENCOUNTER — Telehealth: Payer: Self-pay

## 2020-10-24 NOTE — Telephone Encounter (Signed)
Told Ms Hodgens that the US shows stable simple cyst on the right ovary, It could be a follicle from ovulation as well per Joylene John, NP. Follow up as planned on 11-19-20 with Dr. Denman George.  Pt verbalized understanding.

## 2020-10-31 ENCOUNTER — Encounter: Payer: Self-pay | Admitting: Oncology

## 2020-11-04 ENCOUNTER — Telehealth: Payer: Self-pay | Admitting: *Deleted

## 2020-11-04 NOTE — Telephone Encounter (Signed)
Called and left the patient a message to call the office back. Need to reschedule the patient's appts on 9/28 to another day

## 2020-11-06 NOTE — Telephone Encounter (Signed)
Called the patient and left a message to call the office back. If the patient's stays with the practice after Dr Denman George leaves she will need to follow up with Dr Berline Lopes

## 2020-11-07 ENCOUNTER — Encounter: Payer: Self-pay | Admitting: Oncology

## 2020-11-07 NOTE — Telephone Encounter (Signed)
Patient to follow Dr Denman George to [facility], patient given the phone number

## 2020-11-10 ENCOUNTER — Other Ambulatory Visit: Payer: Self-pay | Admitting: Oncology

## 2020-11-11 ENCOUNTER — Telehealth: Payer: Self-pay | Admitting: Oncology

## 2020-11-11 NOTE — Telephone Encounter (Signed)
Scheduled per sch msg. Called and left msg  

## 2020-11-19 ENCOUNTER — Other Ambulatory Visit: Payer: Commercial Managed Care - PPO

## 2020-11-19 ENCOUNTER — Ambulatory Visit: Payer: Commercial Managed Care - PPO | Admitting: Gynecologic Oncology

## 2021-01-11 NOTE — Progress Notes (Signed)
Joy Reyes  [phone] [phone]     ID: Joy Reyes DOB: Nov 13, 1985  MR#: [id number]  QIW#:[id number]  Patient Care Team: Pa, [place] as PCP - General (Family Medicine) Reshonda Koerber, [name], MD as Consulting Physician (Oncology) [name], RN as Oncology Nurse Navigator [name], RN as Oncology Nurse Navigator [name], MD as Consulting Physician (Plastic Surgery) [name], MD as Consulting Physician (General Surgery) [name], MD as Consulting Physician (Gynecologic Oncology) [name], MD as Attending Physician (Radiation Oncology) [name], MD OTHER MD: PCP= [name] MD   CHIEF COMPLAINT: BRCA1 positive breast cancer  CURRENT TREATMENT: Observation   INTERVAL HISTORY: Joy Reyes" returns today for follow up of her BRCA positive breast cancer.   She took olaparib for 1 day at full dose, 300 mg twice daily, and really could not tolerate it.  We discussed starting at a lower dose but she really preferred not to.  Accordingly we are continuing with observation alone.  Her most recent pelvic ultrasound from 10/11/2020 showed: stable 2 cm simple cyst or dominant follicle in right ovary.  Her gynecologic oncologist Dr. Denman George has gone to [facility] and Joy Reyes currently does not have another ultrasound scheduled   REVIEW OF SYSTEMS: Joy Reyes exercises most days.  She now has Fridays off.  She does not work is fine.  She and her husband enjoy hiking among other activities.  A detailed review of systems today was otherwise noncontributory   COVID 19 VACCINATION STATUS: Never had COVID.  Refuses vaccinations    HISTORY OF CURRENT ILLNESS: From the original intake note:  Joy Reyes has a history of breast cancer, diagnosed in 2012 while she lived in [place], [place], and treated in [place] under Dr [name].  We are obtaining data from that experience but according to the patient her  tumor was the size of an apricot.  She did not have axillary lymph node sampling or radiation.  She remembers 8 neoadjuvant chemotherapy treatments given every 2 weeks with the first including the "red drug", which suggest Cytoxan and Adriamycin in dose dense fashion x4 most likely followed by either Taxol or Taxotere in dose dense fashion x4.  After that experience and given her breast density and BRCA mutation our practice here would have been a yearly follow-up with MRI in addition to yearly mammography.   More recently Joy Reyes herself palpated a change in her right breast.  She brought it to Dr. [name] attention and underwent bilateral diagnostic mammography with tomography and left breast ultrasonography at [facility] on 06/14/2019 showing: breast density category D; there was an area of asymmetry in the upper outer quadrant of the left breast, and ultrasound of that area showed a 2.8 cm mass.  Ultrasound of the axilla showed 1.8 cm lymph node with cortical thickening.    In the right breast they saw only postsurgical changes, with 2 biopsy clips in the retroareolar area.  Accordingly on 06/19/2019 she proceeded to biopsy of the left breast area in question. 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  The left axillary lymph node biopsy was nondiagnostic, with very spare sampling of fibroadipose and lymphoid tissue.  Estrogen receptor was negative, with less than 1% uptake, progesterone receptor was negative, with less than 1% uptake, HER-2 was negative by immunohistochemistry with a score of 1+.  The patient's subsequent history is as detailed below.  PAST MEDICAL HISTORY: Past Medical History:  Diagnosis Date   Cancer [facility]    breast cancer 2012   PONV (postoperative nausea and vomiting)     PAST SURGICAL HISTORY: Past Surgical History:  Procedure Laterality Date   BREAST RECONSTRUCTION WITH  PLACEMENT OF TISSUE EXPANDER AND ALLODERM Bilateral 10/05/20221   BILATERAL BREAST RECONSTRUCTION WITH PLACEMENT OF TISSUE EXPANDER AND ALLODERM (Bilateral Breast)   BREAST RECONSTRUCTION WITH PLACEMENT OF TISSUE EXPANDER AND ALLODERM Bilateral 11/27/2019   Procedure: BILATERAL BREAST RECONSTRUCTION WITH PLACEMENT OF TISSUE EXPANDER AND ALLODERM;  Surgeon: [name], MD;  Location: [facility];  Service: Plastics;  Laterality: Bilateral;   BREAST SURGERY     lumpectomy on right breast   CESAREAN SECTION MULTI-GESTATIONAL N/A 06/01/2015   Procedure: CESAREAN SECTION MULTI-GESTATIONAL;  Surgeon: Waymon Amato, MD;  Location: [facility];  Service: Obstetrics;  Laterality: N/A;   IR IMAGING GUIDED PORT INSERTION  07/05/2019   LOBECTOMY Right 2012   MASTECTOMY W/ SENTINEL NODE BIOPSY Bilateral 11/27/2019    BILATERAL MASTECTOMIES WITH LEFT AXILLARY SENTINEL LYMPH NODE BIOPSY (Bilateral Breast)   MASTECTOMY W/ SENTINEL NODE BIOPSY Bilateral 11/27/2019   Procedure: BILATERAL MASTECTOMIES WITH LEFT AXILLARY SENTINEL LYMPH NODE BIOPSY;  Surgeon: [name], MD;  Location: [facility];  Service: General;  Laterality: Bilateral;  PEC BLOCK, RNFA REQUESTED   PORT-A-CATH REMOVAL Right 11/27/2019   Procedure: REMOVAL PORT-A-CATH;  Surgeon: [name], MD;  Location: [facility];  Service: General;  Laterality: Right;   REMOVAL OF TISSUE EXPANDER AND PLACEMENT OF IMPLANT Bilateral 03/11/2020   Procedure: REMOVAL OF TISSUE EXPANDER AND PLACEMENT OF SILICONE IMPLANT;  Surgeon: [name], MD;  Location: [facility];  Service: Plastics;  Laterality: Bilateral;   SALPINGECTOMY      FAMILY HISTORY: Family History  Problem Relation Age of Onset   Breast cancer Maternal Grandmother        very late in life, lived to 55   Breast cancer Paternal Grandmother        died at age 47   Alzheimer's disease Paternal Grandfather    Ovarian cancer Neg Hx    Colon cancer Neg Hx    Endometrial cancer Neg Hx     Pancreatic cancer Neg Hx    Prostate cancer Neg Hx    The patient's parents are both turning 4 in 2021.  The paternal grandfather died from Alzheimer's disease.  The paternal grandmother died from breast cancer at the age of 45.  The patient's father had 1 sister who had died as a child from meningitis.  He had no brothers.  On the mother side, a maternal grandmother did have breast cancer very late in life, living to be 71.  The paternal grandfather and the only maternal aunt had no history of cancer.  The patient has 1 brother, in good health, no sisters.   GYNECOLOGIC HISTORY:  No LMP recorded. (Menstrual status: Chemotherapy). Menarche: 35 years old Age at first live birth: 35 years old [place] P twins LMP regular as of May 2021 Contraceptive HRT no  Hysterectomy?  No BSO?  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  Her husband Joy Reyes") works for an NGO (the [employer]) out of [place] which helps small agencies doing research set up and succeed.  He wrote a book, [employer], which sounds very interesting and he writes regularly for the [employer] as well.  Their twins are     years old. Joy Reyes has (2?) children from a prior marriage. The patient belongs to an orthodox denomination    ADVANCED DIRECTIVES: In the absence of any documentation to the contrary, the patient's spouse is their HCPOA.    HEALTH MAINTENANCE: Social History   Tobacco Use   Smoking status: Never   Smokeless tobacco: Never  Vaping Use   Vaping Use: Never used  Substance Use Topics   Alcohol use: Not Currently    Alcohol/week: 4.0 standard drinks    Types: 4 Glasses of wine per week   Drug use: No     Colonoscopy: n/a (age)  PAP: Up-to-date  Bone density: n/a (age)   Allergies  Allergen Reactions   Paclitaxel     Anaphylactic per pt history in MD note    Current Outpatient Medications   Medication Sig Dispense Refill   olaparib (LYNPARZA) 150 MG tablet Take 2 tablets (300 mg total) by mouth 2 (two) times daily. Swallow whole. May take with food to decrease nausea and vomiting. Start 01/02/2020 (Patient not taking: No sig reported) 120 tablet 6   No current facility-administered medications for this visit.    OBJECTIVE: White woman who appears well  Vitals:   01/12/21 1514  BP: 111/63  Pulse: 64  Resp: 16  Temp: 97.7 F (36.5 C)  SpO2: 100%     Body mass index is 22.41 kg/m.   Wt Readings from Last 3 Encounters:  01/12/21 147 lb 6.4 oz (66.9 kg)  03/11/20 152 lb 1.9 oz (69 kg)  03/05/20 151 lb (68.5 kg)      ECOG FS:1 - Symptomatic but completely ambulatory  Sclerae unicteric, EOMs intact Wearing a mask No cervical or supraclavicular adenopathy Lungs no rales or rhonchi Heart regular rate and rhythm Abd soft, nontender, positive bowel sounds MSK no focal spinal tenderness, no upper extremity lymphedema Neuro: nonfocal, well oriented, appropriate affect Breasts: Status post bilateral mastectomies with bilateral implant reconstruction.  There is no evidence of local recurrence.  Both axillae are benign.   LAB RESULTS:      Component Value Date/Time   NA 139 01/12/2021 1451   K 4.2 01/12/2021 1451   CL 108 01/12/2021 1451   CO2 24 01/12/2021 1451   GLUCOSE 87 01/12/2021 1451   BUN 7 01/12/2021 1451   CREATININE 0.75 01/12/2021 1451   CALCIUM 9.6 01/12/2021 1451   PROT 7.2 01/12/2021 1451   ALBUMIN 4.5 01/12/2021 1451   AST 20 01/12/2021 1451   ALT 20 01/12/2021 1451   ALKPHOS 38 01/12/2021 1451   BILITOT 0.8 01/12/2021 1451   GFRNONAA >60 01/12/2021 1451   GFRAA >60 11/19/2019 1340    No results found for: TOTALPROTELP, ALBUMINELP, A1GS, A2GS, BETS, BETA2SER, GAMS, MSPIKE, SPEI  Lab Results  Component Value Date   WBC 5.0 01/12/2021   NEUTROABS 3.0 01/12/2021   HGB 12.6 01/12/2021   HCT 37.1 01/12/2021   MCV 93.2 01/12/2021   PLT  189 01/12/2021    No results found for: LABCA2  No components found for: FMBWGY659  No results for input(s): INR in the last 168 hours.  No results found for: LABCA2  No results found for: CAN199  Lab Results  Component Value Date   CAN125 10.2 03/05/2020    No results found for: DJT701  No results found for: CA2729  No components found for: HGQUANT  No results found for: CEA1 / No results found for: CEA1   No  results found for: AFPTUMOR  No results found for: CHROMOGRNA  No results found for: KPAFRELGTCHN, LAMBDASER, KAPLAMBRATIO (kappa/lambda light chains)  No results found for: HGBA, HGBA2QUANT, HGBFQUANT, HGBSQUAN (Hemoglobinopathy evaluation)   No results found for: LDH  No results found for: IRON, TIBC, IRONPCTSAT (Iron and TIBC)  No results found for: FERRITIN  Urinalysis No results found for: COLORURINE, APPEARANCEUR, LABSPEC, PHURINE, GLUCOSEU, HGBUR, BILIRUBINUR, KETONESUR, PROTEINUR, UROBILINOGEN, NITRITE, LEUKOCYTESUR   STUDIES: No results found.   ELIGIBLE FOR AVAILABLE RESEARCH PROTOCOL: no  ASSESSMENT: 35 y.o. BRCA1 positive [facility] woman  (x) BRCA1 positivity:  (a) s/p bilateral salpingectomy 11/28/2013, age 41 (no oophorectomy) with benign pathology [phone], [place], [place])  (b) pelvic ultrasound including transvaginal study 06/01/2019 unremarkable, as was CA 125  (c)  bilateral mastectomies 11/27/2019  (d) pt has frozen eggs at [facility]  (e) considering bilateral oophorectomy    RIGHT BREAST CANCER (1) Status post right breast and axillary node biopsy 2012 for a clinical T2 N0 invasive ductal carcinoma, grade 3, triple negative ([id number])  (a) received neoadjuvant chemotherapy consisting of 4 cycles of doxorubicin and cyclophosphamide, followed by paclitaxel x1 (developed anaphylaxis with the first dose), then 4 doses of docetaxel completed August 2012  (b) right lumpectomy 01/11/2011 with no sentinel  lymph node sampling (refused by patient) 01/11/2011 showed no residual malignancy in the breast (ypT0 ypNX)  (c) no adjuvant radiation  LEFT BREAST CANCER (2) Status post left breast upper outer quadrant biopsy 06/19/2019 for a clinical T2 N1, stage IIIB invasive ductal carcinoma, grade 3, triple negative  (a) left axillary lymph node biopsy 07/12/2019 nondiagnostic [discordant]  (b) chest abdomen and pelvis CT scan 07/05/2019 shows small left axillary lymph nodes, 1.5 cm enhancement in the left breast, no internal mammary adenopathy, no evidence of metastatic disease  (c) bone scan 07/13/2019 shows no evidence of bony metastatic disease  (d) biopsy of RIGHT breast mass 07/18/2019 canceled as bilateral mastectomies plan  (3) neoadjuvant chemotherapy consisting of carboplatin day 1, Abraxane days 1,8,15, repeated Q21 days x 4 cycles, starting 07/10/2019, last dose 09/25/2019  (a) final 2 doses of Abraxane omitted secondary to cytopenias and mild neuropathy  (4) status post bilateral skin sparing mastectomies and left axillary sentinel lymph node sampling 11/27/2019 with pathology showing  (a) on the right, no evidence of cancer  (b) on the left, no residual carcinoma, ypT0 ypN0  (c) a single left axillary lymph node was removed  (5) bilateral tissue expander placement 11/27/2019 with definitive silicone implant placement bilaterally 03/11/2020  (a) Complete incorporation ADM noted bilateral. Natrelle Inspira Full Projection 335 ml implants placed bilateral. REF SRF-335 RIGHT SN [id number] LEFT SN [id number]  (6) patient opted against adjuvant radiation  (7) unable to tolerate olaparib  (8) bilateral oophorectomy at age 65 planned   PLAN: Joy Reyes is now 10 years out from a right-sided breast cancer and 1 year out from her left-sided breast cancer with no evidence of disease recurrence.  This is very favorable.  We discussed olaparib today and if she ever decides to try it in the future  certainly we can make another attempt.  I am not aware of a time limit on when to start that medication.  She would like to postpone bilateral salpingo-oophorectomy until about age 14 if possible.  She had started a screening program with Dr. Denman George who has since moved to [facility].  Joy Reyes is a understands well that we do not have validated screening programs for ovarian  cancer and we do not know if they offer more than reassurance, nevertheless she is very interested in continuing with every 63-monthultrasounds and lab work and I think that is entirely reasonable.  I am going to refer her to Dr. TBerline Lopesfor further discussion   Otherwise she will return to see uKoreain 6 months and from that point likely we will start seeing her on a once a year basis  Total encounter time 25 minutes.*Sarajane JewsC. Mack Alvidrez, MD 01/12/21 3:41 PM Medical Oncology and Hematology [facility] [place] [address]Tel. [phone]   Fax. [phone]  I, KWilburn Mylar am acting as scribe for Dr. GVirgie Dad Leeba Barbe.  I, GLurline DelMD, have reviewed the above documentation for accuracy and completeness, and I agree with the above.   *Total Encounter Time as defined by the Centers for Medicare and Medicaid Services includes, in addition to the face-to-face time of a patient visit (documented in the note above) non-face-to-face time: obtaining and reviewing outside history, ordering and reviewing medications, tests or procedures, care coordination (communications with other health care professionals or caregivers) and documentation in the medical record.

## 2021-01-12 ENCOUNTER — Inpatient Hospital Stay: Payer: Commercial Managed Care - PPO

## 2021-01-12 ENCOUNTER — Other Ambulatory Visit: Payer: Self-pay

## 2021-01-12 ENCOUNTER — Inpatient Hospital Stay: Payer: Commercial Managed Care - PPO | Attending: Oncology | Admitting: Oncology

## 2021-01-12 VITALS — BP 111/63 | HR 64 | Temp 97.7°F | Resp 16 | Ht 68.0 in | Wt 147.4 lb

## 2021-01-12 DIAGNOSIS — C50211 Malignant neoplasm of upper-inner quadrant of right female breast: Secondary | ICD-10-CM

## 2021-01-12 DIAGNOSIS — C50911 Malignant neoplasm of unspecified site of right female breast: Secondary | ICD-10-CM

## 2021-01-12 DIAGNOSIS — Z818 Family history of other mental and behavioral disorders: Secondary | ICD-10-CM | POA: Diagnosis not present

## 2021-01-12 DIAGNOSIS — N6489 Other specified disorders of breast: Secondary | ICD-10-CM | POA: Diagnosis not present

## 2021-01-12 DIAGNOSIS — Z803 Family history of malignant neoplasm of breast: Secondary | ICD-10-CM | POA: Insufficient documentation

## 2021-01-12 DIAGNOSIS — C50412 Malignant neoplasm of upper-outer quadrant of left female breast: Secondary | ICD-10-CM | POA: Diagnosis present

## 2021-01-12 DIAGNOSIS — Z171 Estrogen receptor negative status [ER-]: Secondary | ICD-10-CM

## 2021-01-12 DIAGNOSIS — Z79899 Other long term (current) drug therapy: Secondary | ICD-10-CM | POA: Insufficient documentation

## 2021-01-12 DIAGNOSIS — Z90721 Acquired absence of ovaries, unilateral: Secondary | ICD-10-CM | POA: Insufficient documentation

## 2021-01-12 DIAGNOSIS — Z1501 Genetic susceptibility to malignant neoplasm of breast: Secondary | ICD-10-CM

## 2021-01-12 DIAGNOSIS — Z9079 Acquired absence of other genital organ(s): Secondary | ICD-10-CM | POA: Diagnosis not present

## 2021-01-12 DIAGNOSIS — G629 Polyneuropathy, unspecified: Secondary | ICD-10-CM | POA: Insufficient documentation

## 2021-01-12 DIAGNOSIS — Z9013 Acquired absence of bilateral breasts and nipples: Secondary | ICD-10-CM | POA: Diagnosis not present

## 2021-01-12 LAB — CBC WITH DIFFERENTIAL/PLATELET
Abs Immature Granulocytes: 0.01 10*3/uL (ref 0.00–0.07)
Basophils Absolute: 0 10*3/uL (ref 0.0–0.1)
Basophils Relative: 1 %
Eosinophils Absolute: 0.1 10*3/uL (ref 0.0–0.5)
Eosinophils Relative: 3 %
HCT: 37.1 % (ref 36.0–46.0)
Hemoglobin: 12.6 g/dL (ref 12.0–15.0)
Immature Granulocytes: 0 %
Lymphocytes Relative: 28 %
Lymphs Abs: 1.4 10*3/uL (ref 0.7–4.0)
MCH: 31.7 pg (ref 26.0–34.0)
MCHC: 34 g/dL (ref 30.0–36.0)
MCV: 93.2 fL (ref 80.0–100.0)
Monocytes Absolute: 0.4 10*3/uL (ref 0.1–1.0)
Monocytes Relative: 8 %
Neutro Abs: 3 10*3/uL (ref 1.7–7.7)
Neutrophils Relative %: 60 %
Platelets: 189 10*3/uL (ref 150–400)
RBC: 3.98 MIL/uL (ref 3.87–5.11)
RDW: 12.2 % (ref 11.5–15.5)
WBC: 5 10*3/uL (ref 4.0–10.5)
nRBC: 0 % (ref 0.0–0.2)

## 2021-01-12 LAB — COMPREHENSIVE METABOLIC PANEL
ALT: 20 U/L (ref 0–44)
AST: 20 U/L (ref 15–41)
Albumin: 4.5 g/dL (ref 3.5–5.0)
Alkaline Phosphatase: 38 U/L (ref 38–126)
Anion gap: 7 (ref 5–15)
BUN: 7 mg/dL (ref 6–20)
CO2: 24 mmol/L (ref 22–32)
Calcium: 9.6 mg/dL (ref 8.9–10.3)
Chloride: 108 mmol/L (ref 98–111)
Creatinine, Ser: 0.75 mg/dL (ref 0.44–1.00)
GFR, Estimated: >60 mL/min (ref >60)
Glucose, Bld: 87 mg/dL (ref 70–99)
Potassium: 4.2 mmol/L (ref 3.5–5.1)
Sodium: 139 mmol/L (ref 135–145)
Total Bilirubin: 0.8 mg/dL (ref 0.3–1.2)
Total Protein: 7.2 g/dL (ref 6.5–8.1)

## 2021-01-13 ENCOUNTER — Telehealth: Payer: Self-pay | Admitting: *Deleted

## 2021-01-13 LAB — CA 125: Cancer Antigen (CA) 125: 9.8 U/mL (ref 0.0–38.1)

## 2021-01-13 NOTE — Telephone Encounter (Signed)
Called and scheduled the patient for a follow up appt with Dr Berline Lopes on 12/14 at 2 pm

## 2021-02-03 ENCOUNTER — Other Ambulatory Visit: Payer: Self-pay | Admitting: Oncology

## 2021-02-04 ENCOUNTER — Encounter: Payer: Self-pay | Admitting: Gynecologic Oncology

## 2021-02-04 ENCOUNTER — Inpatient Hospital Stay: Payer: Commercial Managed Care - PPO | Attending: Oncology | Admitting: Gynecologic Oncology

## 2021-02-04 ENCOUNTER — Other Ambulatory Visit: Payer: Self-pay

## 2021-02-04 VITALS — BP 128/70 | HR 78 | Temp 98.5°F | Resp 16 | Ht 68.0 in | Wt 149.8 lb

## 2021-02-04 DIAGNOSIS — Z9013 Acquired absence of bilateral breasts and nipples: Secondary | ICD-10-CM | POA: Insufficient documentation

## 2021-02-04 DIAGNOSIS — Z853 Personal history of malignant neoplasm of breast: Secondary | ICD-10-CM | POA: Diagnosis not present

## 2021-02-04 DIAGNOSIS — Z9079 Acquired absence of other genital organ(s): Secondary | ICD-10-CM | POA: Insufficient documentation

## 2021-02-04 DIAGNOSIS — Z79899 Other long term (current) drug therapy: Secondary | ICD-10-CM | POA: Insufficient documentation

## 2021-02-04 DIAGNOSIS — Z803 Family history of malignant neoplasm of breast: Secondary | ICD-10-CM | POA: Insufficient documentation

## 2021-02-04 DIAGNOSIS — Z148 Genetic carrier of other disease: Secondary | ICD-10-CM | POA: Diagnosis not present

## 2021-02-04 DIAGNOSIS — Z1502 Genetic susceptibility to malignant neoplasm of ovary: Secondary | ICD-10-CM | POA: Insufficient documentation

## 2021-02-04 DIAGNOSIS — C50911 Malignant neoplasm of unspecified site of right female breast: Secondary | ICD-10-CM

## 2021-02-04 DIAGNOSIS — Z1501 Genetic susceptibility to malignant neoplasm of breast: Secondary | ICD-10-CM

## 2021-02-04 NOTE — Progress Notes (Signed)
Gynecologic Oncology Return Clinic Visit  02/04/2021  Reason for Visit: Follow-up in the setting of BRCA1 mutation  Treatment History: Oncology History  Malignant neoplasm of upper-outer quadrant of left breast in female, estrogen receptor negative ([place])  06/26/2019 Initial Diagnosis   Malignant neoplasm of upper-outer quadrant of left breast in female, estrogen receptor negative ([place])   07/10/2019 -  Chemotherapy   The patient had dexamethasone (DECADRON) 4 MG tablet, 8 mg, Oral, Daily, 1 of 1 cycle, Start date: 07/06/2019, End date: -- palonosetron (ALOXI) injection 0.25 mg, 0.25 mg, Intravenous,  Once, 4 of 4 cycles Administration: 0.25 mg (07/10/2019), 0.25 mg (08/07/2019), 0.25 mg (08/28/2019), 0.25 mg (09/25/2019) PACLitaxel-protein bound (ABRAXANE) chemo infusion 175 mg, 100 mg/m2 = 175 mg, Intravenous,  Once, 4 of 4 cycles Administration: 175 mg (07/10/2019), 175 mg (07/17/2019), 175 mg (07/24/2019), 175 mg (08/07/2019), 175 mg (08/14/2019), 175 mg (08/21/2019), 175 mg (08/28/2019), 175 mg (09/04/2019), 175 mg (09/11/2019), 175 mg (09/25/2019) CARBOplatin (PARAPLATIN) 590 mg in sodium chloride 0.9 % 250 mL chemo infusion, 590 mg (100 % of original dose 594 mg), Intravenous,  Once, 4 of 4 cycles Dose modification:   (original dose 594 mg, Cycle 1) Administration: 590 mg (07/10/2019), 630 mg (08/07/2019), 650 mg (08/28/2019), 650 mg (09/25/2019) fosaprepitant (EMEND) 150 mg in sodium chloride 0.9 % 145 mL IVPB, 150 mg, Intravenous,  Once, 4 of 4 cycles Administration: 150 mg (07/10/2019), 150 mg (08/07/2019), 150 mg (08/28/2019), 150 mg (09/25/2019)   for chemotherapy treatment.     Malignant neoplasm of upper-inner quadrant of right breast in female, estrogen receptor negative ([place])  06/26/2019 Initial Diagnosis   Malignant neoplasm of upper-inner quadrant of right breast in female, estrogen receptor negative ([place])   07/10/2019 -  Chemotherapy   The patient had dexamethasone (DECADRON) 4 MG tablet, 8 mg, Oral,  Daily, 1 of 1 cycle, Start date: 07/06/2019, End date: -- palonosetron (ALOXI) injection 0.25 mg, 0.25 mg, Intravenous,  Once, 4 of 4 cycles Administration: 0.25 mg (07/10/2019), 0.25 mg (08/07/2019), 0.25 mg (08/28/2019), 0.25 mg (09/25/2019) PACLitaxel-protein bound (ABRAXANE) chemo infusion 175 mg, 100 mg/m2 = 175 mg, Intravenous,  Once, 4 of 4 cycles Administration: 175 mg (07/10/2019), 175 mg (07/17/2019), 175 mg (07/24/2019), 175 mg (08/07/2019), 175 mg (08/14/2019), 175 mg (08/21/2019), 175 mg (08/28/2019), 175 mg (09/04/2019), 175 mg (09/11/2019), 175 mg (09/25/2019) CARBOplatin (PARAPLATIN) 590 mg in sodium chloride 0.9 % 250 mL chemo infusion, 590 mg (100 % of original dose 594 mg), Intravenous,  Once, 4 of 4 cycles Dose modification:   (original dose 594 mg, Cycle 1) Administration: 590 mg (07/10/2019), 630 mg (08/07/2019), 650 mg (08/28/2019), 650 mg (09/25/2019) fosaprepitant (EMEND) 150 mg in sodium chloride 0.9 % 145 mL IVPB, 150 mg, Intravenous,  Once, 4 of 4 cycles Administration: 150 mg (07/10/2019), 150 mg (08/07/2019), 150 mg (08/28/2019), 150 mg (09/25/2019)   for chemotherapy treatment.     Invasive ductal carcinoma of breast, female, left ([place])  11/27/2019 Initial Diagnosis   Invasive ductal carcinoma of breast, female, left ([place])   01/22/2020 Cancer Staging   Staging form: Breast, AJCC 8th Edition - Pathologic stage from 01/22/2020: No Stage Recommended (ypT0, pN0, cM0, G3, ER-, PR-, HER2-) - Signed by [name], MD on 01/23/2020     The patient's first diagnosis of triple negative breast cancer was in 2012. At that time it was treated with chemotherapy and surgery. Genetic testing determined that she was a carrier of BRCA 1 deleterious mutation. She then underwent laparoscopic salpingectomy in  2015 for risk reducing purposes. Her treatment at that time took place in [place].   She underwent IVF and conceived a twin pregnancy which she delivered by cesarean section.   In April of 2021 she was  diagnosed with a new primary triple negative breast cancer which was treated with chemotherapy and surgery. She had risk reducing bilateral mastectomies with reconstruction. She completed chemotherapy in September, 2021.    She had a screening TVUS in April, 2021 which was normal.    Her medical history is most significant for breast cancer x 2.   Her surgical history is most significant for laparoscopic tubal ligation, cesarean section, breast surgery.   Her gynecologic history is remarkable for no history of prior abnormal pap smears, twin pregnancy delivered at 33 weeks by cesarean section.   Her family cancer history is significant for multiple family members with breast cancer. There is no known ovarian cancer or endometrial cancer among relatives.   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   Interval History: Patient presents today for surveillance visit.  She notes overall doing well.  Continues to have light regular menses, denies significant cramping.  Reports good appetite without nausea or emesis.  Endorses normal bowel and bladder function.  Has 2 embryos still banked, not planning on having future children.  Past Medical/Surgical History: Past Medical History:  Diagnosis Date   Cancer ([place])    breast cancer 2012   PONV (postoperative nausea and vomiting)     Past Surgical History:  Procedure Laterality Date   BREAST RECONSTRUCTION WITH PLACEMENT OF TISSUE EXPANDER AND ALLODERM Bilateral 10/05/20221   BILATERAL BREAST RECONSTRUCTION WITH PLACEMENT OF TISSUE EXPANDER AND ALLODERM (Bilateral Breast)   BREAST RECONSTRUCTION WITH PLACEMENT OF TISSUE EXPANDER AND ALLODERM Bilateral 11/27/2019   Procedure: BILATERAL BREAST RECONSTRUCTION WITH PLACEMENT OF TISSUE EXPANDER AND ALLODERM;  Surgeon: [name], MD;  Location: [facility];  Service: Plastics;  Laterality: Bilateral;   BREAST SURGERY     lumpectomy on right breast   CESAREAN SECTION  MULTI-GESTATIONAL N/A 06/01/2015   Procedure: CESAREAN SECTION MULTI-GESTATIONAL;  Surgeon: Waymon Amato, MD;  Location: [facility];  Service: Obstetrics;  Laterality: N/A;   IR IMAGING GUIDED PORT INSERTION  07/05/2019   LOBECTOMY Right 2012   MASTECTOMY W/ SENTINEL NODE BIOPSY Bilateral 11/27/2019    BILATERAL MASTECTOMIES WITH LEFT AXILLARY SENTINEL LYMPH NODE BIOPSY (Bilateral Breast)   MASTECTOMY W/ SENTINEL NODE BIOPSY Bilateral 11/27/2019   Procedure: BILATERAL MASTECTOMIES WITH LEFT AXILLARY SENTINEL LYMPH NODE BIOPSY;  Surgeon: [name], MD;  Location: [facility];  Service: General;  Laterality: Bilateral;  PEC BLOCK, RNFA REQUESTED   PORT-A-CATH REMOVAL Right 11/27/2019   Procedure: REMOVAL PORT-A-CATH;  Surgeon: [name], MD;  Location: [facility];  Service: General;  Laterality: Right;   REMOVAL OF TISSUE EXPANDER AND PLACEMENT OF IMPLANT Bilateral 03/11/2020   Procedure: REMOVAL OF TISSUE EXPANDER AND PLACEMENT OF SILICONE IMPLANT;  Surgeon: [name], MD;  Location: [facility];  Service: Plastics;  Laterality: Bilateral;   SALPINGECTOMY      Family History  Problem Relation Age of Onset   Breast cancer Maternal Grandmother        very late in life, lived to 58   Breast cancer Paternal Grandmother        died at age 46   Alzheimer's disease Paternal Grandfather    Ovarian cancer Neg Hx    Colon cancer Neg Hx    Endometrial cancer Neg  Hx    Pancreatic cancer Neg Hx    Prostate cancer Neg Hx     Social History   Socioeconomic History   Marital status: Married    Spouse name: Not on file   Number of children: Not on file   Years of education: Not on file   Highest education level: Not on file  Occupational History   Not on file  Tobacco Use   Smoking status: Never   Smokeless tobacco: Never  Vaping Use   Vaping Use: Never used  Substance and Sexual Activity   Alcohol use: Not Currently    Alcohol/week: 4.0 standard drinks    Types: 4 Glasses of  wine per week   Drug use: No   Sexual activity: Yes    Birth control/protection: Surgical  Other Topics Concern   Not on file  Social History Narrative   Not on file   Social Determinants of Health   Financial Resource Strain: Not on file  Food Insecurity: Not on file  Transportation Needs: Not on file  Physical Activity: Not on file  Stress: Not on file  Social Connections: Not on file    Current Medications:  Current Outpatient Medications:    olaparib (LYNPARZA) 150 MG tablet, Take 2 tablets (300 mg total) by mouth 2 (two) times daily. Swallow whole. May take with food to decrease nausea and vomiting. Start 01/02/2020 (Patient not taking: No sig reported), Disp: 120 tablet, Rfl: 6  Review of Systems: Denies appetite changes, fevers, chills, fatigue, unexplained weight changes. Denies hearing loss, neck lumps or masses, mouth sores, ringing in ears or voice changes. Denies cough or wheezing.  Denies shortness of breath. Denies chest pain or palpitations. Denies leg swelling. Denies abdominal distention, pain, blood in stools, constipation, diarrhea, nausea, vomiting, or early satiety. Denies pain with intercourse, dysuria, frequency, hematuria or incontinence. Denies hot flashes, pelvic pain, vaginal bleeding or vaginal discharge.   Denies joint pain, back pain or muscle pain/cramps. Denies itching, rash, or wounds. Denies dizziness, headaches, numbness or seizures. Denies swollen lymph nodes or glands, denies easy bruising or bleeding. Denies anxiety, depression, confusion, or decreased concentration.  Physical Exam: BP 128/70 (BP Location: Right Arm, Patient Position: Sitting)    Pulse 78    Temp 98.5 F (36.9 C) (Tympanic)    Resp 16    Ht 5' 8"  (1.727 m)    Wt 149 lb 12.8 oz (67.9 kg)    SpO2 100%    BMI 22.78 kg/m  General: Alert, oriented, no acute distress. HEENT: Normocephalic, atraumatic, sclera anicteric. Chest: Clear to auscultation bilaterally.  No wheezes or  rhonchi. Cardiovascular: Regular rate and rhythm, no murmurs. Abdomen: soft, nontender.  Normoactive bowel sounds.  No masses or hepatosplenomegaly appreciated.   Extremities: Grossly normal range of motion.  Warm, well perfused.  No edema bilaterally. Skin: No rashes or lesions noted. Lymphatics: No cervical, supraclavicular, or inguinal adenopathy. GU: Normal appearing external genitalia without erythema, excoriation, or lesions.  Speculum exam reveals well rugated vaginal mucosa, cervix normal appearing.  No lesions or masses.  Bimanual exam reveals small mobile uterus, no adnexal masses.    Laboratory & Radiologic Studies: CA-125 01/12/21: 9.8 CA-125 03/05/20: 10.2  Pelvic ultrasound 10/21/20: IMPRESSION: Stable 2 cm simple cyst or dominant follicle in the right ovary. No followup imaging recommended. Note: This recommendation does not apply to premenarchal patients or to those with increased risk (genetic, family history, elevated tumor markers or other high-risk factors) of ovarian cancer. Reference: Radiology  2019 Nov; 293(2):359-371.  Assessment & Plan: Kristianne Albin is a 35 y.o. with a history of a deleterious mutation in BRCA 1, history of ER/PR/HER2 negative breast cancer. S/p prophylactic mastectomies. S/p prophylactic salpingectomy 2012.    Patient saw my prior partner at her last visit.  They had had discussion about risk reduction surgery in the setting of her BRCA1 mutation.  We discussed this again today including increased risk of ovarian cancer with a BRCA1 mutation as well as a small but increased risk of a rare type of aggressive uterine cancer.  Given the earlier age of onset of ovarian cancer and BRCA 1 mutation carriers, recommendation from our national cancer Society is for consideration of risk reducing BSO between the age of 70 and 107.  I would not recommend surgery prior to 35 especially given the negative effects of surgical menopause at such a young age.   While I do not think it is unreasonable to continue with surveillance until closer to the age of 36, we discussed that there is no screening test for ovarian cancer.  While we perform CA-125, pelvic ultrasounds, and pelvic exams every 6 months in patients with BRCA mutations, the screening test have not been validated as tools to diagnose cancer at an earlier stage.  The patient has had some benefit already by removal of bilateral fallopian tubes.  At this time, she would like to wait for risk reducing surgery.  Given that, I have recommended that we continue with surveillance visits every 6 months to also include an exam, pelvic ultrasound, and CA-125.  Patient's last pelvic ultrasound was at the end of August and she had a CEA-125 in November.  We will plan to get both of these in late February or early March, and she will return to see me for a visit in June.  We reviewed signs and symptoms that would be concerning for ovarian cancer, and I have encouraged her to call me if she were develop any of these.  38 minutes of total time was spent for this patient encounter, including preparation, face-to-face counseling with the patient and coordination of care, and documentation of the encounter.  Jeral Pinch, MD  Division of Gynecologic Oncology  Department of Obstetrics and Gynecology  [facility]

## 2021-02-04 NOTE — Patient Instructions (Addendum)
It was a pleasure meeting you today.  I would recommend that we continue with CA-125 and pelvic ultrasounds every 6 months until you are ready to proceed with definitive surgery with removal of your ovaries, possible hysterectomy.  I will release your ultrasound and CA-125 results in late February or March when I get them. I will see you back for a visit and exam in 6 months.  If anything changes between now and your next visit, please call to see me sooner.

## 2021-04-20 ENCOUNTER — Inpatient Hospital Stay: Payer: Commercial Managed Care - PPO | Attending: Oncology

## 2021-04-20 ENCOUNTER — Other Ambulatory Visit: Payer: Self-pay

## 2021-04-20 ENCOUNTER — Ambulatory Visit (HOSPITAL_COMMUNITY)
Admission: RE | Admit: 2021-04-20 | Discharge: 2021-04-20 | Disposition: A | Discharge status: Home / Self Care | Payer: Commercial Managed Care - PPO | Source: Ambulatory Visit | Attending: Gynecologic Oncology | Admitting: Gynecologic Oncology

## 2021-04-20 DIAGNOSIS — C50911 Malignant neoplasm of unspecified site of right female breast: Secondary | ICD-10-CM

## 2021-04-20 DIAGNOSIS — Z148 Genetic carrier of other disease: Secondary | ICD-10-CM | POA: Diagnosis not present

## 2021-04-20 DIAGNOSIS — Z1501 Genetic susceptibility to malignant neoplasm of breast: Secondary | ICD-10-CM | POA: Insufficient documentation

## 2021-04-20 DIAGNOSIS — C50412 Malignant neoplasm of upper-outer quadrant of left female breast: Secondary | ICD-10-CM | POA: Diagnosis present

## 2021-04-20 DIAGNOSIS — Z1502 Genetic susceptibility to malignant neoplasm of ovary: Secondary | ICD-10-CM | POA: Insufficient documentation

## 2021-04-20 DIAGNOSIS — Z171 Estrogen receptor negative status [ER-]: Secondary | ICD-10-CM | POA: Insufficient documentation

## 2021-04-20 DIAGNOSIS — C50211 Malignant neoplasm of upper-inner quadrant of right female breast: Secondary | ICD-10-CM | POA: Diagnosis present

## 2021-04-20 LAB — CBC WITH DIFFERENTIAL/PLATELET
Abs Immature Granulocytes: 0.02 10*3/uL (ref 0.00–0.07)
Basophils Absolute: 0 10*3/uL (ref 0.0–0.1)
Basophils Relative: 1 %
Eosinophils Absolute: 0.2 10*3/uL (ref 0.0–0.5)
Eosinophils Relative: 4 %
HCT: 35.1 % — ABNORMAL LOW (ref 36.0–46.0)
Hemoglobin: 11.9 g/dL — ABNORMAL LOW (ref 12.0–15.0)
Immature Granulocytes: 0 %
Lymphocytes Relative: 30 %
Lymphs Abs: 1.4 10*3/uL (ref 0.7–4.0)
MCH: 31.6 pg (ref 26.0–34.0)
MCHC: 33.9 g/dL (ref 30.0–36.0)
MCV: 93.4 fL (ref 80.0–100.0)
Monocytes Absolute: 0.4 10*3/uL (ref 0.1–1.0)
Monocytes Relative: 8 %
Neutro Abs: 2.6 10*3/uL (ref 1.7–7.7)
Neutrophils Relative %: 57 %
Platelets: 216 10*3/uL (ref 150–400)
RBC: 3.76 MIL/uL — ABNORMAL LOW (ref 3.87–5.11)
RDW: 12.7 % (ref 11.5–15.5)
WBC: 4.6 10*3/uL (ref 4.0–10.5)
nRBC: 0 % (ref 0.0–0.2)

## 2021-04-20 LAB — COMPREHENSIVE METABOLIC PANEL
ALT: 20 U/L (ref 0–44)
AST: 16 U/L (ref 15–41)
Albumin: 4.3 g/dL (ref 3.5–5.0)
Alkaline Phosphatase: 28 U/L — ABNORMAL LOW (ref 38–126)
Anion gap: 6 (ref 5–15)
BUN: 11 mg/dL (ref 6–20)
CO2: 28 mmol/L (ref 22–32)
Calcium: 9.5 mg/dL (ref 8.9–10.3)
Chloride: 110 mmol/L (ref 98–111)
Creatinine, Ser: 0.82 mg/dL (ref 0.44–1.00)
GFR, Estimated: >60 mL/min (ref >60)
Glucose, Bld: 106 mg/dL — ABNORMAL HIGH (ref 70–99)
Potassium: 4.1 mmol/L (ref 3.5–5.1)
Sodium: 144 mmol/L (ref 135–145)
Total Bilirubin: 0.4 mg/dL (ref 0.3–1.2)
Total Protein: 6.7 g/dL (ref 6.5–8.1)

## 2021-04-21 ENCOUNTER — Telehealth: Payer: Self-pay | Admitting: Hematology and Oncology

## 2021-04-21 LAB — CA 125: Cancer Antigen (CA) 125: 16.9 U/mL (ref 0.0–38.1)

## 2021-04-21 NOTE — Telephone Encounter (Signed)
Sch per 2/27 inbasket, left msg for pt

## 2021-06-25 ENCOUNTER — Telehealth: Payer: Self-pay | Admitting: Hematology and Oncology

## 2021-06-25 NOTE — Telephone Encounter (Signed)
Rescheduled appointment per providers template. Left message.  ? ?

## 2021-07-06 ENCOUNTER — Other Ambulatory Visit: Payer: Commercial Managed Care - PPO

## 2021-07-06 ENCOUNTER — Ambulatory Visit: Payer: Commercial Managed Care - PPO | Admitting: Hematology and Oncology

## 2021-07-17 ENCOUNTER — Other Ambulatory Visit: Payer: Self-pay

## 2021-07-17 ENCOUNTER — Inpatient Hospital Stay (HOSPITAL_BASED_OUTPATIENT_CLINIC_OR_DEPARTMENT_OTHER): Payer: Commercial Managed Care - PPO | Admitting: Hematology and Oncology

## 2021-07-17 ENCOUNTER — Encounter: Payer: Self-pay | Admitting: Hematology and Oncology

## 2021-07-17 ENCOUNTER — Inpatient Hospital Stay: Payer: Commercial Managed Care - PPO | Attending: Hematology and Oncology

## 2021-07-17 VITALS — BP 120/79 | HR 78 | Temp 97.7°F | Resp 15 | Wt 145.6 lb

## 2021-07-17 DIAGNOSIS — C50412 Malignant neoplasm of upper-outer quadrant of left female breast: Secondary | ICD-10-CM | POA: Insufficient documentation

## 2021-07-17 DIAGNOSIS — Z803 Family history of malignant neoplasm of breast: Secondary | ICD-10-CM | POA: Diagnosis not present

## 2021-07-17 DIAGNOSIS — Z818 Family history of other mental and behavioral disorders: Secondary | ICD-10-CM | POA: Insufficient documentation

## 2021-07-17 DIAGNOSIS — Z9079 Acquired absence of other genital organ(s): Secondary | ICD-10-CM | POA: Insufficient documentation

## 2021-07-17 DIAGNOSIS — G629 Polyneuropathy, unspecified: Secondary | ICD-10-CM | POA: Insufficient documentation

## 2021-07-17 DIAGNOSIS — Z171 Estrogen receptor negative status [ER-]: Secondary | ICD-10-CM | POA: Diagnosis not present

## 2021-07-17 DIAGNOSIS — Z79899 Other long term (current) drug therapy: Secondary | ICD-10-CM | POA: Diagnosis not present

## 2021-07-17 DIAGNOSIS — Z9013 Acquired absence of bilateral breasts and nipples: Secondary | ICD-10-CM | POA: Insufficient documentation

## 2021-07-17 DIAGNOSIS — N6489 Other specified disorders of breast: Secondary | ICD-10-CM | POA: Insufficient documentation

## 2021-07-17 LAB — CBC WITH DIFFERENTIAL/PLATELET
Abs Immature Granulocytes: 0 10*3/uL (ref 0.00–0.07)
Basophils Absolute: 0 10*3/uL (ref 0.0–0.1)
Basophils Relative: 1 %
Eosinophils Absolute: 0.1 10*3/uL (ref 0.0–0.5)
Eosinophils Relative: 2 %
HCT: 36 % (ref 36.0–46.0)
Hemoglobin: 12.5 g/dL (ref 12.0–15.0)
Immature Granulocytes: 0 %
Lymphocytes Relative: 27 %
Lymphs Abs: 1.3 10*3/uL (ref 0.7–4.0)
MCH: 32.1 pg (ref 26.0–34.0)
MCHC: 34.7 g/dL (ref 30.0–36.0)
MCV: 92.3 fL (ref 80.0–100.0)
Monocytes Absolute: 0.3 10*3/uL (ref 0.1–1.0)
Monocytes Relative: 7 %
Neutro Abs: 2.9 10*3/uL (ref 1.7–7.7)
Neutrophils Relative %: 63 %
Platelets: 211 10*3/uL (ref 150–400)
RBC: 3.9 MIL/uL (ref 3.87–5.11)
RDW: 13 % (ref 11.5–15.5)
WBC: 4.6 10*3/uL (ref 4.0–10.5)
nRBC: 0 % (ref 0.0–0.2)

## 2021-07-17 LAB — COMPREHENSIVE METABOLIC PANEL
ALT: 15 U/L (ref 0–44)
AST: 15 U/L (ref 15–41)
Albumin: 4.6 g/dL (ref 3.5–5.0)
Alkaline Phosphatase: 30 U/L — ABNORMAL LOW (ref 38–126)
Anion gap: 7 (ref 5–15)
BUN: 10 mg/dL (ref 6–20)
CO2: 23 mmol/L (ref 22–32)
Calcium: 9.8 mg/dL (ref 8.9–10.3)
Chloride: 107 mmol/L (ref 98–111)
Creatinine, Ser: 0.79 mg/dL (ref 0.44–1.00)
GFR, Estimated: >60 mL/min (ref >60)
Glucose, Bld: 111 mg/dL — ABNORMAL HIGH (ref 70–99)
Potassium: 3.7 mmol/L (ref 3.5–5.1)
Sodium: 137 mmol/L (ref 135–145)
Total Bilirubin: 0.6 mg/dL (ref 0.3–1.2)
Total Protein: 7.1 g/dL (ref 6.5–8.1)

## 2021-07-17 NOTE — Progress Notes (Signed)
[place]  [phone] Fax:([phone]) [phone]     ID: [name] DOB: [birth year]  MR#: [id number]  MMH#:[id number]  Patient Care Team: Pa, Omao as PCP - General (Family Medicine) [name], RN as Oncology Nurse Navigator [name], RN as Oncology Nurse Navigator [name], MD as Consulting Physician (Plastic Surgery) [name], MD as Consulting Physician (General Surgery) [name], MD as Attending Physician (Radiation Oncology) Benay Pike, MD OTHER MD: PCP= [name] MD  CHIEF COMPLAINT: BRCA1 positive breast cancer  CURRENT TREATMENT: Observation  INTERVAL HISTORY:  Kloee Ballew" returns today for follow up of her BRCA positive breast cancer.  She took olaparib for 1 day at full dose, 300 mg twice daily, and really could not tolerate it.  We discussed starting at a lower dose but she really preferred not to.  Accordingly we are continuing with observation alone. She continues surveillance with Dr Berline Lopes, last ultrasound February 2023 with normal sonographic appearance of the uterus and left ovary.  Nonvisualization of the right ovary due to shadowing bowel gas Ca1 25 on April 20, 2021 was normal at 16.9 She denies any complaints for me today.  She feels quite well, exercises regularly.  She eats healthy.  She denies any changes in her implants.  Rest of the pertinent 10 point ROS reviewed and negative   COVID 19 VACCINATION STATUS: Never had COVID.  Refuses vaccinations    HISTORY OF CURRENT ILLNESS: From the original intake note:  Kitrina Maurin has a history of breast cancer, diagnosed in 2012 while she lived in [place], [place], and treated in [place] under Dr [name].  We are obtaining data from that experience but according to the patient her tumor was the size of an apricot.  She did not have axillary lymph node sampling or radiation.  She remembers 8 neoadjuvant chemotherapy  treatments given every 2 weeks with the first including the "red drug", which suggest Cytoxan and Adriamycin in dose dense fashion x4 most likely followed by either Taxol or Taxotere in dose dense fashion x4.  After that experience and given her breast density and BRCA mutation our practice here would have been a yearly follow-up with MRI in addition to yearly mammography.   More recently [name] herself palpated a change in her right breast.  She brought it to Dr. [name] attention and underwent bilateral diagnostic mammography with tomography and left breast ultrasonography at [facility] on 06/14/2019 showing: breast density category D; there was an area of asymmetry in the upper outer quadrant of the left breast, and ultrasound of that area showed a 2.8 cm mass.  Ultrasound of the axilla showed 1.8 cm lymph node with cortical thickening.    In the right breast they saw only postsurgical changes, with 2 biopsy clips in the retroareolar area.  Accordingly on 06/19/2019 she proceeded to biopsy of the left breast area in question. 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  The left axillary lymph node biopsy was nondiagnostic, with very spare sampling of fibroadipose and lymphoid tissue.  Estrogen receptor was negative, with less than 1% uptake, progesterone receptor was negative, with less than 1% uptake, HER-2 was negative by immunohistochemistry with a score of 1+.  The patient's subsequent history is as detailed below.   PAST MEDICAL HISTORY: Past Medical History:  Diagnosis Date   Cancer [facility])    breast cancer 2012   PONV (  postoperative nausea and vomiting)     PAST SURGICAL HISTORY: Past Surgical History:  Procedure Laterality Date   BREAST RECONSTRUCTION WITH PLACEMENT OF TISSUE EXPANDER AND ALLODERM Bilateral 10/05/20221   BILATERAL BREAST RECONSTRUCTION WITH PLACEMENT OF TISSUE EXPANDER AND  ALLODERM (Bilateral Breast)   BREAST RECONSTRUCTION WITH PLACEMENT OF TISSUE EXPANDER AND ALLODERM Bilateral 11/27/2019   Procedure: BILATERAL BREAST RECONSTRUCTION WITH PLACEMENT OF TISSUE EXPANDER AND ALLODERM;  Surgeon: [name], MD;  Location: [place];  Service: Plastics;  Laterality: Bilateral;   BREAST SURGERY     lumpectomy on right breast   CESAREAN SECTION MULTI-GESTATIONAL N/A 06/01/2015   Procedure: CESAREAN SECTION MULTI-GESTATIONAL;  Surgeon: Waymon Amato, MD;  Location: [facility];  Service: Obstetrics;  Laterality: N/A;   IR IMAGING GUIDED PORT INSERTION  07/05/2019   LOBECTOMY Right 2012   MASTECTOMY W/ SENTINEL NODE BIOPSY Bilateral 11/27/2019    BILATERAL MASTECTOMIES WITH LEFT AXILLARY SENTINEL LYMPH NODE BIOPSY (Bilateral Breast)   MASTECTOMY W/ SENTINEL NODE BIOPSY Bilateral 11/27/2019   Procedure: BILATERAL MASTECTOMIES WITH LEFT AXILLARY SENTINEL LYMPH NODE BIOPSY;  Surgeon: [name], MD;  Location: [place];  Service: General;  Laterality: Bilateral;  PEC BLOCK, RNFA REQUESTED   PORT-A-CATH REMOVAL Right 11/27/2019   Procedure: REMOVAL PORT-A-CATH;  Surgeon: [name], MD;  Location: [place];  Service: General;  Laterality: Right;   REMOVAL OF TISSUE EXPANDER AND PLACEMENT OF IMPLANT Bilateral 03/11/2020   Procedure: REMOVAL OF TISSUE EXPANDER AND PLACEMENT OF SILICONE IMPLANT;  Surgeon: [name], MD;  Location: [place];  Service: Plastics;  Laterality: Bilateral;   SALPINGECTOMY      FAMILY HISTORY: Family History  Problem Relation Age of Onset   Breast cancer Maternal Grandmother        very late in life, lived to 30   Breast cancer Paternal Grandmother        died at age 68   Alzheimer's disease Paternal Grandfather    Ovarian cancer Neg Hx    Colon cancer Neg Hx    Endometrial cancer Neg Hx    Pancreatic cancer Neg Hx    Prostate cancer Neg Hx    The patient's parents are both turning 56 in 2021.  The paternal grandfather died  from Alzheimer's disease.  The paternal grandmother died from breast cancer at the age of 50.  The patient's father had 1 sister who had died as a child from meningitis.  He had no brothers.  On the mother side, a maternal grandmother did have breast cancer very late in life, living to be 66.  The paternal grandfather and the only maternal aunt had no history of cancer.  The patient has 1 brother, in good health, no sisters.   GYNECOLOGIC HISTORY:  No LMP recorded. (Menstrual status: Chemotherapy). Menarche: 36 years old Age at first live birth: 36 years old [place] P twins LMP regular as of May 2021 Contraceptive HRT no  Hysterectomy?  No BSO?  Status post bilateral salpingectomy, no oophorectomy   SOCIAL HISTORY: (updated May 2021)  [name] "[name]" works as a Music therapist.  Her husband [name]") works for an NGO (the [employer]) out of [place] which helps small agencies doing research set up and succeed.  He wrote a book, [employer], which sounds very interesting and he writes regularly for the [employer] as well.  Their twins are     years old. [name] has (2?) children from a prior marriage. The patient belongs to an  orthodox denomination    ADVANCED DIRECTIVES: In the absence of any documentation to the contrary, the patient's spouse is their HCPOA.    HEALTH MAINTENANCE: Social History   Tobacco Use   Smoking status: Never   Smokeless tobacco: Never  Vaping Use   Vaping Use: Never used  Substance Use Topics   Alcohol use: Not Currently    Alcohol/week: 4.0 standard drinks    Types: 4 Glasses of wine per week   Drug use: No     Colonoscopy: n/a (age)  PAP: Up-to-date  Bone density: n/a (age)   Allergies  Allergen Reactions   Paclitaxel     Anaphylactic per pt history in MD note    No current outpatient medications on file.   No current facility-administered medications for this visit.    OBJECTIVE: White woman who appears well  Vitals:    07/17/21 1308  BP: 120/79  Pulse: 78  Resp: 15  Temp: 97.7 F (36.5 C)  SpO2: 100%     Body mass index is 22.14 kg/m.   Wt Readings from Last 3 Encounters:  07/17/21 145 lb 9.6 oz (66 kg)  02/04/21 149 lb 12.8 oz (67.9 kg)  01/12/21 147 lb 6.4 oz (66.9 kg)      ECOG FS:1 - Symptomatic but completely ambulatory  Sclerae unicteric, EOMs intact Wearing a mask No cervical or supraclavicular adenopathy Lungs no rales or rhonchi Heart regular rate and rhythm Abd soft, nontender, positive bowel sounds MSK no focal spinal tenderness, no upper extremity lymphedema Neuro: nonfocal, well oriented, appropriate affect Breasts: Status post bilateral mastectomies with implant reconstruction.  There is no evidence of recurrence or regional adenopathy.  There is a small area of palpable abnormality about the right breast which appears cystic on exam.  Patient mentions that she has a cyst from time to time which resolved spontaneously.   LAB RESULTS:      Component Value Date/Time   NA 137 07/17/2021 1253   K 3.7 07/17/2021 1253   CL 107 07/17/2021 1253   CO2 23 07/17/2021 1253   GLUCOSE 111 (H) 07/17/2021 1253   BUN 10 07/17/2021 1253   CREATININE 0.79 07/17/2021 1253   CALCIUM 9.8 07/17/2021 1253   PROT 7.1 07/17/2021 1253   ALBUMIN 4.6 07/17/2021 1253   AST 15 07/17/2021 1253   ALT 15 07/17/2021 1253   ALKPHOS 30 (L) 07/17/2021 1253   BILITOT 0.6 07/17/2021 1253   GFRNONAA >60 07/17/2021 1253   GFRAA >60 11/19/2019 1340    No results found for: TOTALPROTELP, ALBUMINELP, A1GS, A2GS, BETS, BETA2SER, GAMS, MSPIKE, SPEI  Lab Results  Component Value Date   WBC 4.6 07/17/2021   NEUTROABS 2.9 07/17/2021   HGB 12.5 07/17/2021   HCT 36.0 07/17/2021   MCV 92.3 07/17/2021   PLT 211 07/17/2021    No results found for: LABCA2  No components found for: QZRAQT622  No results for input(s): INR in the last 168 hours.  No results found for: LABCA2  No results found for:  CAN199  Lab Results  Component Value Date   CAN125 16.9 04/20/2021    No results found for: QJF354  No results found for: CA2729  No components found for: HGQUANT  No results found for: CEA1 / No results found for: CEA1   No results found for: AFPTUMOR  No results found for: CHROMOGRNA  No results found for: KPAFRELGTCHN, LAMBDASER, KAPLAMBRATIO (kappa/lambda light chains)  No results found for: HGBA, HGBA2QUANT, HGBFQUANT, HGBSQUAN (Hemoglobinopathy evaluation)  No results found for: LDH  No results found for: IRON, TIBC, IRONPCTSAT (Iron and TIBC)  No results found for: FERRITIN  Urinalysis No results found for: COLORURINE, APPEARANCEUR, LABSPEC, PHURINE, GLUCOSEU, HGBUR, BILIRUBINUR, KETONESUR, PROTEINUR, UROBILINOGEN, NITRITE, LEUKOCYTESUR   STUDIES: No results found.   ELIGIBLE FOR AVAILABLE RESEARCH PROTOCOL: no  ASSESSMENT: 36 y.o. BRCA1 positive [facility] woman  (x) BRCA1 positivity:  (a) s/p bilateral salpingectomy 11/28/2013, age 8 (no oophorectomy) with benign pathology [phone], [place])  (b) pelvic ultrasound including transvaginal study 06/01/2019 unremarkable, as was CA 125  (c)  bilateral mastectomies 11/27/2019  (d) pt has frozen eggs at [facility]  (e) considering bilateral oophorectomy    RIGHT BREAST CANCER (1) Status post right breast and axillary node biopsy 2012 for a clinical T2 N0 invasive ductal carcinoma, grade 3, triple negative ([id number])  (a) received neoadjuvant chemotherapy consisting of 4 cycles of doxorubicin and cyclophosphamide, followed by paclitaxel x1 (developed anaphylaxis with the first dose), then 4 doses of docetaxel completed August 2012  (b) right lumpectomy 01/11/2011 with no sentinel lymph node sampling (refused by patient) 01/11/2011 showed no residual malignancy in the breast (ypT0 ypNX)  (c) no adjuvant radiation  LEFT BREAST CANCER (2) Status post left breast upper outer  quadrant biopsy 06/19/2019 for a clinical T2 N1, stage IIIB invasive ductal carcinoma, grade 3, triple negative  (a) left axillary lymph node biopsy 07/12/2019 nondiagnostic [discordant]  (b) chest abdomen and pelvis CT scan 07/05/2019 shows small left axillary lymph nodes, 1.5 cm enhancement in the left breast, no internal mammary adenopathy, no evidence of metastatic disease  (c) bone scan 07/13/2019 shows no evidence of bony metastatic disease  (d) biopsy of RIGHT breast mass 07/18/2019 canceled as bilateral mastectomies plan  (3) neoadjuvant chemotherapy consisting of carboplatin day 1, Abraxane days 1,8,15, repeated Q21 days x 4 cycles, starting 07/10/2019, last dose 09/25/2019  (a) final 2 doses of Abraxane omitted secondary to cytopenias and mild neuropathy  (4) status post bilateral skin sparing mastectomies and left axillary sentinel lymph node sampling 11/27/2019 with pathology showing  (a) on the right, no evidence of cancer  (b) on the left, no residual carcinoma, ypT0 ypN0  (c) a single left axillary lymph node was removed  (5) bilateral tissue expander placement 11/27/2019 with definitive silicone implant placement bilaterally 03/11/2020  (a) Complete incorporation ADM noted bilateral. Natrelle Inspira Full Projection 335 ml implants placed bilateral. REF SRF-335 RIGHT SN [id number] LEFT SN [id number]  (6) patient opted against adjuvant radiation  (7) unable to tolerate olaparib  (8) bilateral oophorectomy at age 59 planned   PLAN: [name] is now 11 years out from a right-sided breast cancer and 2 year out from her left-sided breast cancer with no evidence of disease recurrence.  She was recommended adjuvant olaparib, she tried it at full dose for 1 day, felt nauseous and had other GI side effects and decided not to take it. She denies any changes in her health concerning for breast cancer recurrence.  On physical examination today there is a small fluid-filled nodule about the  right breast likely benign.  She was however asked to contact us if this nodule does not resolve in the next 2 to 3 weeks and she and her husband both expressed understanding.  I have also offered an ultrasound of this region to confirm the benign nature.  She would like to wait and see if this resolves on its own since she has had fluid-filled nodules  in the past which have resolved. She would like to postpone bilateral salpingo-oophorectomy until about age 46 if possible.   We have discussed once again that there is no good screening for ovarian cancer and patients with early ovarian cancer do not tend to manifest any symptoms.  She however is interested in continuing ultrasounds as well as CA125 as recommended by gynecological oncology.  She wants to defer oophorectomy until 74 or 36 years of age Since she is 2 years out from her left-sided breast cancer and since she follows up with surgery team as well as plastic surgery team, she was recommended to schedule follow-up in 1 year.     Total encounter time 30 minutes.*  *Total Encounter Time as defined by the Centers for Medicare and Medicaid Services includes, in addition to the face-to-face time of a patient visit (documented in the note above) non-face-to-face time: obtaining and reviewing outside history, ordering and reviewing medications, tests or procedures, care coordination (communications with other health care professionals or caregivers) and documentation in the medical record.

## 2021-08-07 ENCOUNTER — Inpatient Hospital Stay: Payer: Commercial Managed Care - PPO | Admitting: Gynecologic Oncology

## 2021-08-27 ENCOUNTER — Encounter: Payer: Self-pay | Admitting: Gynecologic Oncology

## 2021-08-28 ENCOUNTER — Encounter: Payer: Self-pay | Admitting: Gynecologic Oncology

## 2021-08-28 ENCOUNTER — Inpatient Hospital Stay: Payer: Commercial Managed Care - PPO | Attending: Hematology and Oncology | Admitting: Gynecologic Oncology

## 2021-08-28 ENCOUNTER — Other Ambulatory Visit: Payer: Self-pay

## 2021-08-28 VITALS — BP 112/69 | HR 63 | Temp 97.7°F | Resp 17 | Wt 144.1 lb

## 2021-08-28 DIAGNOSIS — C50911 Malignant neoplasm of unspecified site of right female breast: Secondary | ICD-10-CM

## 2021-08-28 DIAGNOSIS — Z9221 Personal history of antineoplastic chemotherapy: Secondary | ICD-10-CM | POA: Diagnosis not present

## 2021-08-28 DIAGNOSIS — Z853 Personal history of malignant neoplasm of breast: Secondary | ICD-10-CM | POA: Diagnosis not present

## 2021-08-28 DIAGNOSIS — Z9013 Acquired absence of bilateral breasts and nipples: Secondary | ICD-10-CM | POA: Diagnosis not present

## 2021-08-28 DIAGNOSIS — Z1501 Genetic susceptibility to malignant neoplasm of breast: Secondary | ICD-10-CM | POA: Diagnosis present

## 2021-08-28 DIAGNOSIS — Z1502 Genetic susceptibility to malignant neoplasm of ovary: Secondary | ICD-10-CM | POA: Diagnosis present

## 2021-08-28 DIAGNOSIS — Z1509 Genetic susceptibility to other malignant neoplasm: Secondary | ICD-10-CM | POA: Diagnosis not present

## 2021-08-28 NOTE — Patient Instructions (Signed)
It was good to see you today.  The office will get you scheduled for a follow-up ultrasound and CA-125 in August.  I will plan to see you sometime in January.  Please call at the end of December or early January to schedule a visit to see me.  You develop any new symptoms before then, please call to see me sooner.

## 2021-08-28 NOTE — Progress Notes (Signed)
Gynecologic Oncology Return Clinic Visit  08/28/21  Reason for Visit: Follow-up in the setting of BRCA1 mutation  Treatment History: Oncology History  Malignant neoplasm of upper-outer quadrant of left breast in female, estrogen receptor negative ([place])  06/26/2019 Initial Diagnosis   Malignant neoplasm of upper-outer quadrant of left breast in female, estrogen receptor negative ([place])   07/10/2019 -  Chemotherapy   The patient had dexamethasone (DECADRON) 4 MG tablet, 8 mg, Oral, Daily, 1 of 1 cycle, Start date: 07/06/2019, End date: -- palonosetron (ALOXI) injection 0.25 mg, 0.25 mg, Intravenous,  Once, 4 of 4 cycles Administration: 0.25 mg (07/10/2019), 0.25 mg (08/07/2019), 0.25 mg (08/28/2019), 0.25 mg (09/25/2019) PACLitaxel-protein bound (ABRAXANE) chemo infusion 175 mg, 100 mg/m2 = 175 mg, Intravenous,  Once, 4 of 4 cycles Administration: 175 mg (07/10/2019), 175 mg (07/17/2019), 175 mg (07/24/2019), 175 mg (08/07/2019), 175 mg (08/14/2019), 175 mg (08/21/2019), 175 mg (08/28/2019), 175 mg (09/04/2019), 175 mg (09/11/2019), 175 mg (09/25/2019) CARBOplatin (PARAPLATIN) 590 mg in sodium chloride 0.9 % 250 mL chemo infusion, 590 mg (100 % of original dose 594 mg), Intravenous,  Once, 4 of 4 cycles Dose modification:   (original dose 594 mg, Cycle 1) Administration: 590 mg (07/10/2019), 630 mg (08/07/2019), 650 mg (08/28/2019), 650 mg (09/25/2019) fosaprepitant (EMEND) 150 mg in sodium chloride 0.9 % 145 mL IVPB, 150 mg, Intravenous,  Once, 4 of 4 cycles Administration: 150 mg (07/10/2019), 150 mg (08/07/2019), 150 mg (08/28/2019), 150 mg (09/25/2019)  for chemotherapy treatment.    Malignant neoplasm of upper-inner quadrant of right breast in female, estrogen receptor negative ([place])  06/26/2019 Initial Diagnosis   Malignant neoplasm of upper-inner quadrant of right breast in female, estrogen receptor negative ([place])   07/10/2019 -  Chemotherapy   The patient had dexamethasone (DECADRON) 4 MG tablet, 8 mg, Oral, Daily, 1 of  1 cycle, Start date: 07/06/2019, End date: -- palonosetron (ALOXI) injection 0.25 mg, 0.25 mg, Intravenous,  Once, 4 of 4 cycles Administration: 0.25 mg (07/10/2019), 0.25 mg (08/07/2019), 0.25 mg (08/28/2019), 0.25 mg (09/25/2019) PACLitaxel-protein bound (ABRAXANE) chemo infusion 175 mg, 100 mg/m2 = 175 mg, Intravenous,  Once, 4 of 4 cycles Administration: 175 mg (07/10/2019), 175 mg (07/17/2019), 175 mg (07/24/2019), 175 mg (08/07/2019), 175 mg (08/14/2019), 175 mg (08/21/2019), 175 mg (08/28/2019), 175 mg (09/04/2019), 175 mg (09/11/2019), 175 mg (09/25/2019) CARBOplatin (PARAPLATIN) 590 mg in sodium chloride 0.9 % 250 mL chemo infusion, 590 mg (100 % of original dose 594 mg), Intravenous,  Once, 4 of 4 cycles Dose modification:   (original dose 594 mg, Cycle 1) Administration: 590 mg (07/10/2019), 630 mg (08/07/2019), 650 mg (08/28/2019), 650 mg (09/25/2019) fosaprepitant (EMEND) 150 mg in sodium chloride 0.9 % 145 mL IVPB, 150 mg, Intravenous,  Once, 4 of 4 cycles Administration: 150 mg (07/10/2019), 150 mg (08/07/2019), 150 mg (08/28/2019), 150 mg (09/25/2019)  for chemotherapy treatment.    Invasive ductal carcinoma of breast, female, left ([place])  11/27/2019 Initial Diagnosis   Invasive ductal carcinoma of breast, female, left ([place])   01/22/2020 Cancer Staging   Staging form: Breast, AJCC 8th Edition - Pathologic stage from 01/22/2020: No Stage Recommended (ypT0, pN0, cM0, G3, ER-, PR-, HER2-) - Signed by [name], MD on 01/23/2020    The patient's first diagnosis of triple negative breast cancer was in 2012. At that time it was treated with chemotherapy and surgery. Genetic testing determined that she was a carrier of BRCA 1 deleterious mutation. She then underwent laparoscopic salpingectomy in 2015 for risk reducing purposes.  Her treatment at that time took place in [place].   She underwent IVF and conceived a twin pregnancy which she delivered by cesarean section.   In April of 2021 she was diagnosed with a  new primary triple negative breast cancer which was treated with chemotherapy and surgery. She had risk reducing bilateral mastectomies with reconstruction. She completed chemotherapy in September, 2021.    She had a screening TVUS in April, 2021 which was normal.    Her medical history is most significant for breast cancer x 2.   Her surgical history is most significant for laparoscopic tubal ligation, cesarean section, breast surgery.   Her gynecologic history is remarkable for no history of prior abnormal pap smears, twin pregnancy delivered at 33 weeks by cesarean section.   Her family cancer history is significant for multiple family members with breast cancer. There is no known ovarian cancer or endometrial cancer among relatives.   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    Interval History: Patient reports doing well.  Denies any abdominal or pelvic pain.  Reports normal bowel and bladder function.  Continues to have normal menses.  Past Medical/Surgical History: Past Medical History:  Diagnosis Date   PONV (postoperative nausea and vomiting)     Past Surgical History:  Procedure Laterality Date   BREAST RECONSTRUCTION WITH PLACEMENT OF TISSUE EXPANDER AND ALLODERM Bilateral 10/05/20221   BILATERAL BREAST RECONSTRUCTION WITH PLACEMENT OF TISSUE EXPANDER AND ALLODERM (Bilateral Breast)   BREAST RECONSTRUCTION WITH PLACEMENT OF TISSUE EXPANDER AND ALLODERM Bilateral 11/27/2019   Procedure: BILATERAL BREAST RECONSTRUCTION WITH PLACEMENT OF TISSUE EXPANDER AND ALLODERM;  Surgeon: [name], MD;  Location: [facility];  Service: Plastics;  Laterality: Bilateral;   BREAST SURGERY     lumpectomy on right breast   CESAREAN SECTION MULTI-GESTATIONAL N/A 06/01/2015   Procedure: CESAREAN SECTION MULTI-GESTATIONAL;  Surgeon: Waymon Amato, MD;  Location: [facility];  Service: Obstetrics;  Laterality: N/A;   IR IMAGING GUIDED PORT INSERTION  07/05/2019   LOBECTOMY Right 2012    MASTECTOMY W/ SENTINEL NODE BIOPSY Bilateral 11/27/2019    BILATERAL MASTECTOMIES WITH LEFT AXILLARY SENTINEL LYMPH NODE BIOPSY (Bilateral Breast)   MASTECTOMY W/ SENTINEL NODE BIOPSY Bilateral 11/27/2019   Procedure: BILATERAL MASTECTOMIES WITH LEFT AXILLARY SENTINEL LYMPH NODE BIOPSY;  Surgeon: [name], MD;  Location: [facility];  Service: General;  Laterality: Bilateral;  PEC BLOCK, RNFA REQUESTED   PORT-A-CATH REMOVAL Right 11/27/2019   Procedure: REMOVAL PORT-A-CATH;  Surgeon: [name], MD;  Location: [facility];  Service: General;  Laterality: Right;   REMOVAL OF TISSUE EXPANDER AND PLACEMENT OF IMPLANT Bilateral 03/11/2020   Procedure: REMOVAL OF TISSUE EXPANDER AND PLACEMENT OF SILICONE IMPLANT;  Surgeon: [name], MD;  Location: [facility];  Service: Plastics;  Laterality: Bilateral;   SALPINGECTOMY      Family History  Problem Relation Age of Onset   Breast cancer Maternal Grandmother        very late in life, lived to 34   Breast cancer Paternal Grandmother        died at age 74   Alzheimer's disease Paternal Grandfather    Ovarian cancer Neg Hx    Colon cancer Neg Hx    Endometrial cancer Neg Hx    Pancreatic cancer Neg Hx    Prostate cancer Neg Hx     Social History   Socioeconomic History   Marital status: Married    Spouse name: Not on file  Number of children: Not on file   Years of education: Not on file   Highest education level: Not on file  Occupational History   Not on file  Tobacco Use   Smoking status: Never   Smokeless tobacco: Never  Vaping Use   Vaping Use: Never used  Substance and Sexual Activity   Alcohol use: Not Currently    Alcohol/week: 4.0 standard drinks of alcohol    Types: 4 Glasses of wine per week   Drug use: No   Sexual activity: Yes    Birth control/protection: Surgical  Other Topics Concern   Not on file  Social History Narrative   Not on file   Social Determinants of Health   Financial  Resource Strain: Not on file  Food Insecurity: Not on file  Transportation Needs: Not on file  Physical Activity: Not on file  Stress: Not on file  Social Connections: Not on file    Current Medications:  Current Outpatient Medications:    tretinoin (RETIN-A) 0.025 % cream, Apply topically at bedtime., Disp: , Rfl:   Review of Systems: Denies appetite changes, fevers, chills, fatigue, unexplained weight changes. Denies hearing loss, neck lumps or masses, mouth sores, ringing in ears or voice changes. Denies cough or wheezing.  Denies shortness of breath. Denies chest pain or palpitations. Denies leg swelling. Denies abdominal distention, pain, blood in stools, constipation, diarrhea, nausea, vomiting, or early satiety. Denies pain with intercourse, dysuria, frequency, hematuria or incontinence. Denies hot flashes, pelvic pain, vaginal bleeding or vaginal discharge.   Denies joint pain, back pain or muscle pain/cramps. Denies itching, rash, or wounds. Denies dizziness, headaches, numbness or seizures. Denies swollen lymph nodes or glands, denies easy bruising or bleeding. Denies anxiety, depression, confusion, or decreased concentration.  Physical Exam: BP 112/69   Pulse 63   Temp 97.7 F (36.5 C) (Tympanic)   Resp 17   Wt 144 lb 2 oz (65.4 kg)   SpO2 100%   BMI 21.91 kg/m  General: Alert, oriented, no acute distress. HEENT: Normocephalic, atraumatic, sclera anicteric. Chest: Clear to auscultation bilaterally.  No wheezes or rhonchi. Cardiovascular: Regular rate and rhythm, no murmurs. Abdomen: soft, nontender.  Normoactive bowel sounds.  No masses or hepatosplenomegaly appreciated.   Extremities: Grossly normal range of motion.  Warm, well perfused.  No edema bilaterally. Skin: No rashes or lesions noted. Lymphatics: No cervical, supraclavicular, or inguinal adenopathy. GU: Normal appearing external genitalia without erythema, excoriation, or lesions.  Speculum exam  reveals well rugated vaginal mucosa, cervix normal appearing.  No lesions or masses.  Bimanual exam reveals small mobile uterus, no adnexal masses.    Laboratory & Radiologic Studies: CA-125 04/20/21: 16.9 CA-125 01/12/21: 9.8 CA-125 03/05/20: 10.2   Pelvic ultrasound 04/20/20: FINDINGS: Uterus   Measurements: 5.2 x 3.7 x 4.6 cm = volume: 75 mL. No fibroids or other mass visualized.   Endometrium   Thickness: 12 mm.  No focal abnormality visualized.   Right ovary   Obscured by shadowing bowel gas.   Left ovary   Measurements: 3.3 x 2.2 x 2.1 cm = volume: 8 mL.  Normal appearance.   Other findings   No abnormal free fluid.   IMPRESSION: 1. Normal sonographic appearance of the uterus and left ovary. 2. Nonvisualization of the right ovary due to shadowing bowel gas.  Assessment & Plan: Joy Reyes is a 36 y.o. woman with a history of a deleterious mutation in BRCA 1, history of ER/PR/HER2 negative breast cancer. S/p prophylactic mastectomies. S/p  prophylactic salpingectomy 2012.    Patient is doing well.  No masses appreciated on exam.  We will plan for ultrasound and CA-125 in August, 6 months after her last testing.  We have previously discussed risk-reducing surgery in the setting of her BRCA1 mutation between the age of 29 and 31.  She has already undergone bilateral salpingectomy. Patient saw my prior partner at her last visit.  They had had discussion about risk reduction surgery in the setting of her BRCA1 mutation.  Discussed whether patient would be a candidate for hormone replacement after surgery.  Although she has a BRCA1 mutation, she has undergone bilateral mastectomy.  Her tumor was ER negative.  Given bilateral mastectomy, her risk of new breast cancer is quite low.  We will reach out to her medical oncologist when ready to move forward with surgery, but I suspect that estrogen replacement therapy would be possible.   We have previously discussed the timing  of surgery.  While I do not think it is unreasonable to continue with surveillance until closer to the age of 66, we reviewed again today that there is no screening test for ovarian cancer.  While we perform CA-125, pelvic ultrasounds, and pelvic exams every 6 months in patients with BRCA mutations, the screening test have not been validated as tools to diagnose cancer at an earlier stage.  We reviewed signs and symptoms that would be concerning for ovarian cancer, and I have encouraged her to call me if she were develop any of these.  I will plan to see the patient back in 6 months for follow-up.  22 minutes of total time was spent for this patient encounter, including preparation, face-to-face counseling with the patient and coordination of care, and documentation of the encounter.  Jeral Pinch, MD  Division of Gynecologic Oncology  Department of Obstetrics and Gynecology  [facility]

## 2021-09-14 ENCOUNTER — Other Ambulatory Visit: Payer: Self-pay

## 2021-09-22 ENCOUNTER — Other Ambulatory Visit: Payer: Self-pay

## 2021-10-19 ENCOUNTER — Other Ambulatory Visit: Payer: Self-pay | Admitting: *Deleted

## 2021-10-19 DIAGNOSIS — C50412 Malignant neoplasm of upper-outer quadrant of left female breast: Secondary | ICD-10-CM

## 2021-10-20 ENCOUNTER — Inpatient Hospital Stay: Payer: Commercial Managed Care - PPO | Attending: Hematology and Oncology

## 2021-10-20 ENCOUNTER — Other Ambulatory Visit: Payer: Self-pay

## 2021-10-20 ENCOUNTER — Ambulatory Visit (HOSPITAL_COMMUNITY)
Admission: RE | Admit: 2021-10-20 | Discharge: 2021-10-20 | Disposition: A | Discharge status: Home / Self Care | Payer: Commercial Managed Care - PPO | Source: Ambulatory Visit | Attending: Gynecologic Oncology | Admitting: Gynecologic Oncology

## 2021-10-20 DIAGNOSIS — Z1502 Genetic susceptibility to malignant neoplasm of ovary: Secondary | ICD-10-CM | POA: Insufficient documentation

## 2021-10-20 DIAGNOSIS — Z171 Estrogen receptor negative status [ER-]: Secondary | ICD-10-CM | POA: Insufficient documentation

## 2021-10-20 DIAGNOSIS — Z79899 Other long term (current) drug therapy: Secondary | ICD-10-CM | POA: Diagnosis not present

## 2021-10-20 DIAGNOSIS — Z1501 Genetic susceptibility to malignant neoplasm of breast: Secondary | ICD-10-CM | POA: Insufficient documentation

## 2021-10-20 DIAGNOSIS — C50911 Malignant neoplasm of unspecified site of right female breast: Secondary | ICD-10-CM | POA: Insufficient documentation

## 2021-10-20 DIAGNOSIS — C50412 Malignant neoplasm of upper-outer quadrant of left female breast: Secondary | ICD-10-CM | POA: Insufficient documentation

## 2021-10-20 LAB — CBC WITH DIFFERENTIAL (CANCER CENTER ONLY)
Abs Immature Granulocytes: 0.01 10*3/uL (ref 0.00–0.07)
Basophils Absolute: 0 10*3/uL (ref 0.0–0.1)
Basophils Relative: 1 %
Eosinophils Absolute: 0.1 10*3/uL (ref 0.0–0.5)
Eosinophils Relative: 3 %
HCT: 37.1 % (ref 36.0–46.0)
Hemoglobin: 13.1 g/dL (ref 12.0–15.0)
Immature Granulocytes: 0 %
Lymphocytes Relative: 36 %
Lymphs Abs: 1.6 10*3/uL (ref 0.7–4.0)
MCH: 32.5 pg (ref 26.0–34.0)
MCHC: 35.3 g/dL (ref 30.0–36.0)
MCV: 92.1 fL (ref 80.0–100.0)
Monocytes Absolute: 0.3 10*3/uL (ref 0.1–1.0)
Monocytes Relative: 7 %
Neutro Abs: 2.3 10*3/uL (ref 1.7–7.7)
Neutrophils Relative %: 53 %
Platelet Count: 232 10*3/uL (ref 150–400)
RBC: 4.03 MIL/uL (ref 3.87–5.11)
RDW: 12 % (ref 11.5–15.5)
WBC Count: 4.3 10*3/uL (ref 4.0–10.5)
nRBC: 0 % (ref 0.0–0.2)

## 2021-10-20 LAB — CMP (CANCER CENTER ONLY)
ALT: 16 U/L (ref 0–44)
AST: 17 U/L (ref 15–41)
Albumin: 5 g/dL (ref 3.5–5.0)
Alkaline Phosphatase: 35 U/L — ABNORMAL LOW (ref 38–126)
Anion gap: 6 (ref 5–15)
BUN: 9 mg/dL (ref 6–20)
CO2: 27 mmol/L (ref 22–32)
Calcium: 10.1 mg/dL (ref 8.9–10.3)
Chloride: 105 mmol/L (ref 98–111)
Creatinine: 0.67 mg/dL (ref 0.44–1.00)
GFR, Estimated: >60 mL/min (ref >60)
Glucose, Bld: 97 mg/dL (ref 70–99)
Potassium: 4.1 mmol/L (ref 3.5–5.1)
Sodium: 138 mmol/L (ref 135–145)
Total Bilirubin: 0.6 mg/dL (ref 0.3–1.2)
Total Protein: 7.6 g/dL (ref 6.5–8.1)

## 2021-10-21 LAB — CA 125: Cancer Antigen (CA) 125: 25.7 U/mL (ref 0.0–38.1)

## 2021-10-22 ENCOUNTER — Other Ambulatory Visit: Payer: Self-pay | Admitting: Gynecologic Oncology

## 2021-10-22 DIAGNOSIS — C50911 Malignant neoplasm of unspecified site of right female breast: Secondary | ICD-10-CM

## 2021-10-22 DIAGNOSIS — N83209 Unspecified ovarian cyst, unspecified side: Secondary | ICD-10-CM

## 2021-10-27 ENCOUNTER — Telehealth: Payer: Self-pay | Admitting: *Deleted

## 2021-10-27 NOTE — Telephone Encounter (Signed)
Per Dr Berline Lopes scheduled the patient for an [place] scan on 10/18, patient aware of date/time/instructions

## 2021-12-09 ENCOUNTER — Ambulatory Visit (HOSPITAL_COMMUNITY)
Admission: RE | Admit: 2021-12-09 | Discharge: 2021-12-09 | Disposition: A | Discharge status: Home / Self Care | Payer: Commercial Managed Care - PPO | Source: Ambulatory Visit | Attending: Gynecologic Oncology | Admitting: Gynecologic Oncology

## 2021-12-09 DIAGNOSIS — N83209 Unspecified ovarian cyst, unspecified side: Secondary | ICD-10-CM | POA: Diagnosis present

## 2021-12-09 DIAGNOSIS — Z1501 Genetic susceptibility to malignant neoplasm of breast: Secondary | ICD-10-CM | POA: Insufficient documentation

## 2021-12-09 DIAGNOSIS — C50911 Malignant neoplasm of unspecified site of right female breast: Secondary | ICD-10-CM | POA: Diagnosis present

## 2021-12-11 ENCOUNTER — Telehealth: Payer: Self-pay | Admitting: Gynecologic Oncology

## 2021-12-11 DIAGNOSIS — C50911 Malignant neoplasm of unspecified site of right female breast: Secondary | ICD-10-CM

## 2021-12-11 DIAGNOSIS — N83209 Unspecified ovarian cyst, unspecified side: Secondary | ICD-10-CM

## 2021-12-11 NOTE — Telephone Encounter (Signed)
Called the patient to discuss ultrasound findings. Given increasing size of indeterminate lesion in the setting of a BRCA mutation, I recommend MRI for better characterization.  Jeral Pinch MD Gynecologic Oncology

## 2021-12-21 ENCOUNTER — Ambulatory Visit (HOSPITAL_COMMUNITY)
Admission: RE | Admit: 2021-12-21 | Discharge: 2021-12-21 | Disposition: A | Discharge status: Home / Self Care | Payer: Commercial Managed Care - PPO | Source: Ambulatory Visit | Attending: Gynecologic Oncology | Admitting: Gynecologic Oncology

## 2021-12-21 DIAGNOSIS — N83209 Unspecified ovarian cyst, unspecified side: Secondary | ICD-10-CM | POA: Diagnosis not present

## 2021-12-21 DIAGNOSIS — C50911 Malignant neoplasm of unspecified site of right female breast: Secondary | ICD-10-CM | POA: Insufficient documentation

## 2021-12-21 DIAGNOSIS — Z1501 Genetic susceptibility to malignant neoplasm of breast: Secondary | ICD-10-CM | POA: Insufficient documentation

## 2021-12-21 MED ORDER — GADOBUTROL 1 MMOL/ML IV SOLN
6.5000 mL | Freq: Once | INTRAVENOUS | Status: AC | PRN
  Administered 2021-12-21: 6.5 mL via INTRAVENOUS

## 2021-12-23 ENCOUNTER — Telehealth: Payer: Self-pay | Admitting: *Deleted

## 2021-12-23 NOTE — Telephone Encounter (Signed)
Attempted to reach the patient to schedule a follow up appt in January. No answer, No voicemail

## 2021-12-25 NOTE — Telephone Encounter (Signed)
Attempted to reach the patient to schedule a follow up appt in January. No answer, No voicemail

## 2022-01-05 NOTE — Telephone Encounter (Signed)
Called and left the patient a message to call the office back, patient needs to be scheduled for an appt in January

## 2022-01-07 ENCOUNTER — Encounter: Payer: Self-pay | Admitting: Oncology

## 2022-04-01 IMAGING — MR MR BREAST*R* WO/W CM
5 of 6 series · 34 of 48 positions shown · IV contrast (gadavist)
Comparison: Recent MRI of the breast July 06, 2019

CLINICAL DATA: 33-year-old premenopausal patient with recent
diagnosis left breast cancer, planning bilateral mastectomies. Known
BRCA 1 mutation. On recent bilateral breast MRI there was a mass
described in the upper inner right breast recommended for MRI guided
biopsy. The patient had marked background parenchymal enhancement
pattern on the recent breast MRI. Today, she is having her menstrual
cycle and recently completed her second cycle of chemotherapy.

LABS:  None obtained
EXAM:
MR OF THE RIGHT BREAST WITH AND WITHOUT CONTRAST
TECHNIQUE: Multiplanar, multisequence MR images of the right breast were
obtained prior to and following the intravenous administration of 6
ml of Gadavist.

[Series 2: fiducial unilateral · sagittal · 2.0mm · 1.33mm/px · 3 of 52 slices shown]
[im 1/52]
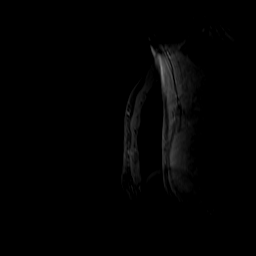
[im 26/52]
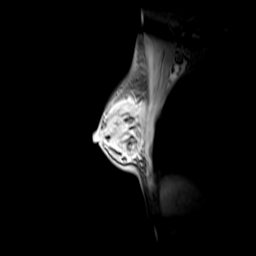
[im 52/52]
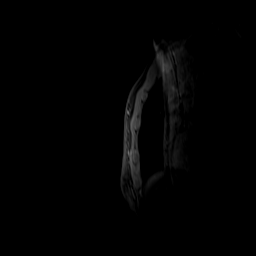

[Series 3: dynamic pre · axial · non-contrast · 1.3mm · 0.73mm/px · z∈[-92,+93]mm · 9 of 144 slices shown]
[im 1/144]
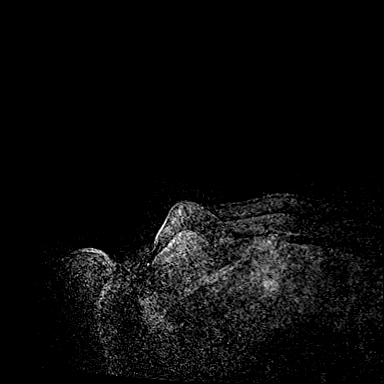
[im 18/144]
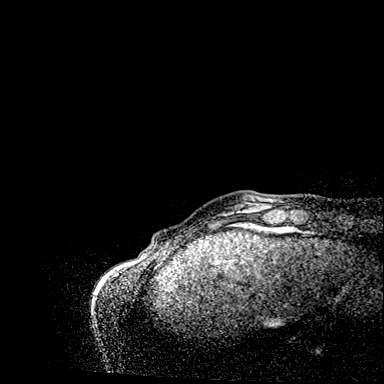
[im 36/144]
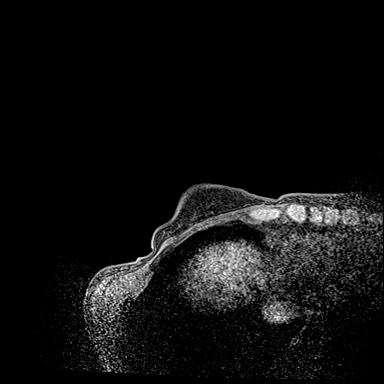
[im 54/144]
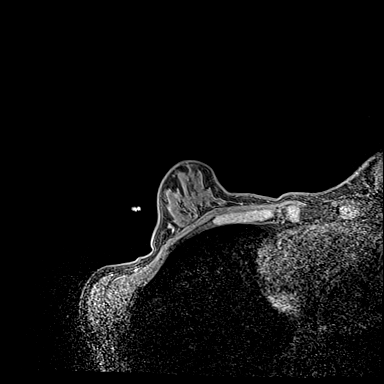
[im 72/144]
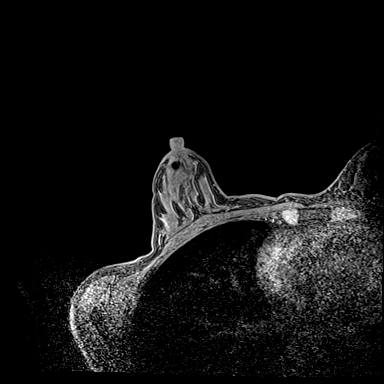
[im 90/144]
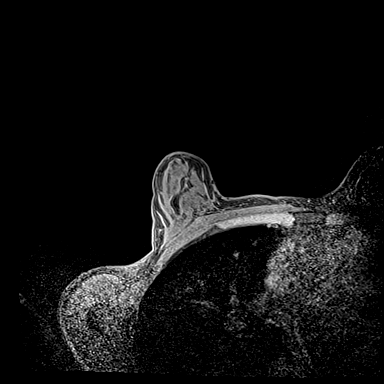
[im 108/144]
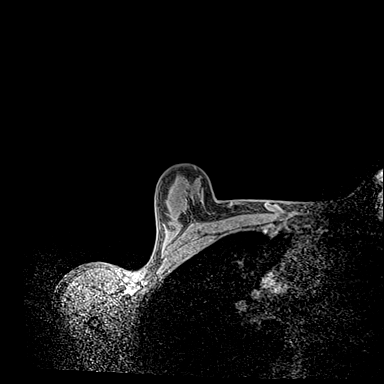
[im 126/144]
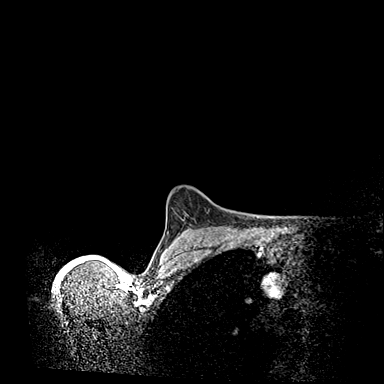
[im 144/144]
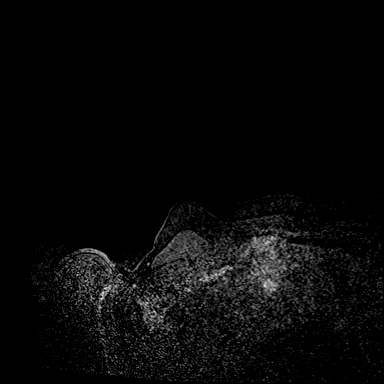

[Series 4: dynamic post 20 · axial · 1.3mm · 0.73mm/px · z∈[-92,+93]mm · 9 of 144 slices shown (1 of 2)]
[im 1/144]
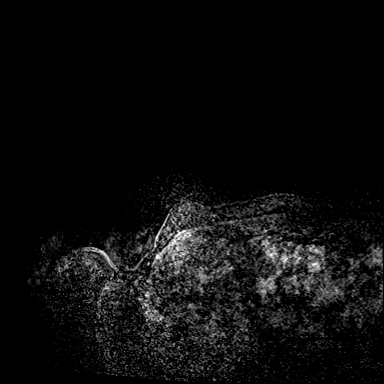
[im 18/144]
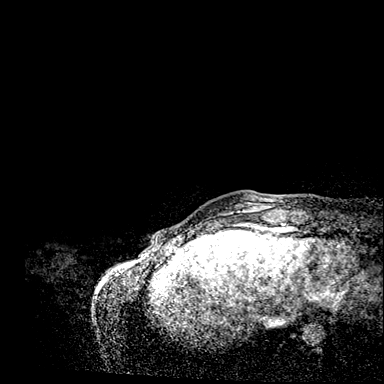
[im 36/144]
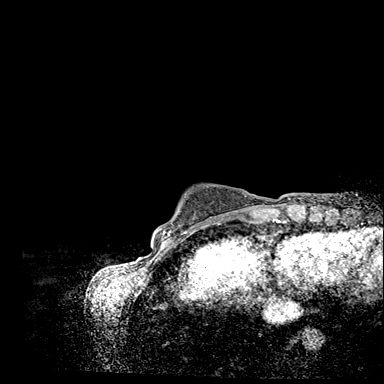
[im 54/144]
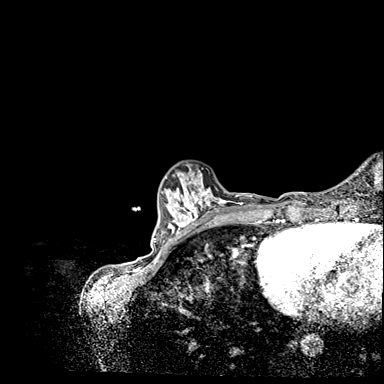
[im 72/144]
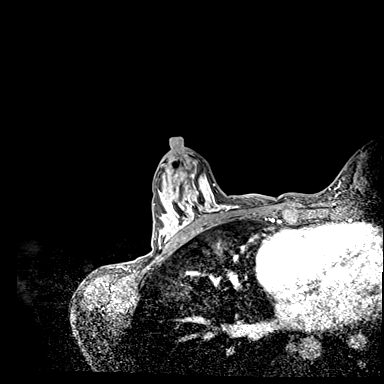
[im 90/144]
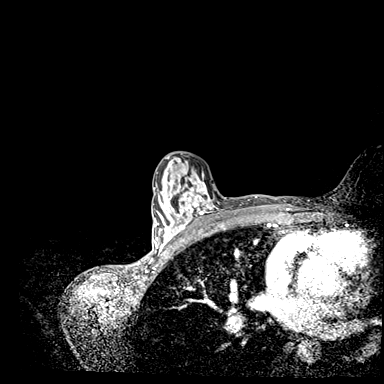
[im 108/144]
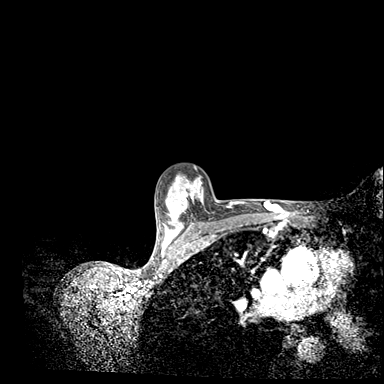
[im 126/144]
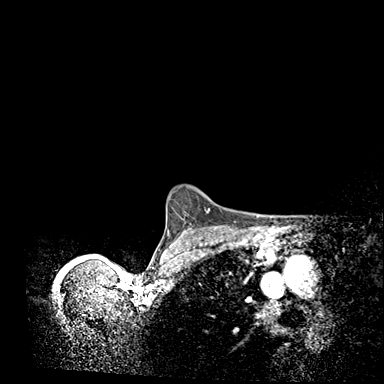
[im 144/144]
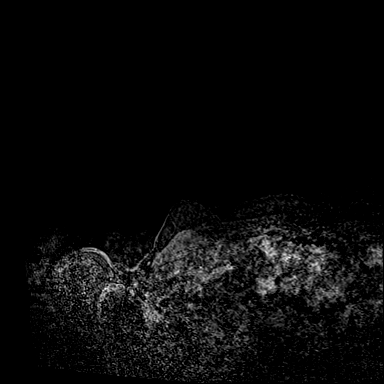

[Series 5: dynamic post 20 · axial · 1.3mm · 0.73mm/px · z∈[-92,+93]mm · 9 of 144 slices shown (2 of 2)]
[im 1/144]
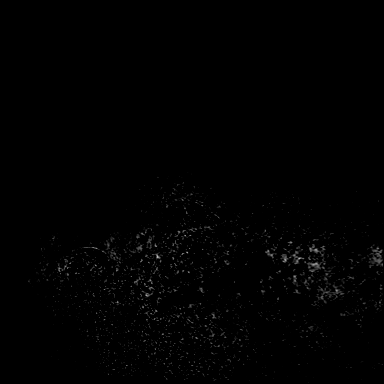
[im 18/144]
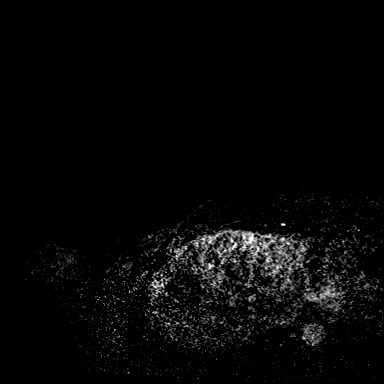
[im 36/144]
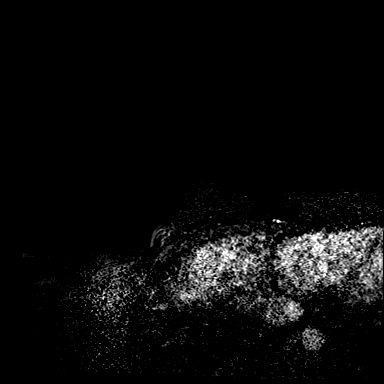
[im 54/144]
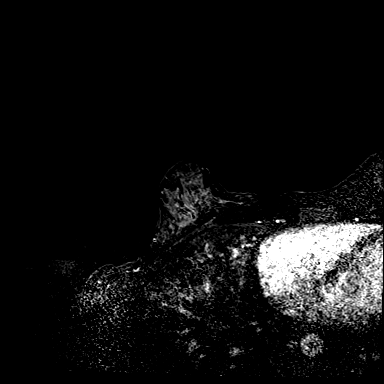
[im 72/144]
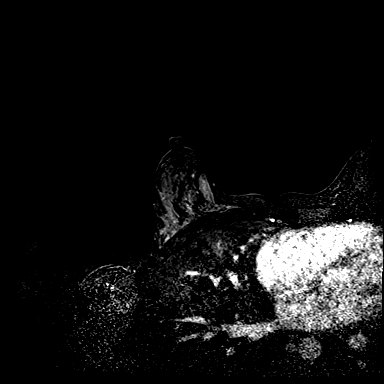
[im 90/144]
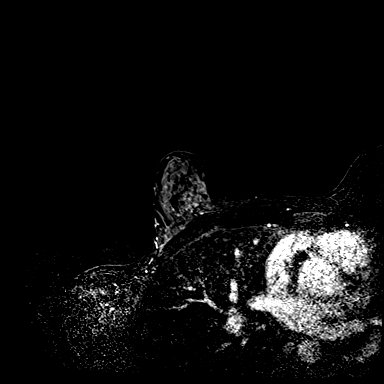
[im 108/144]
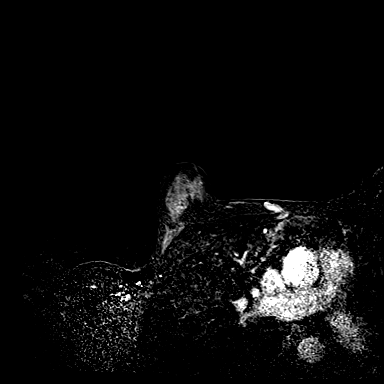
[im 126/144]
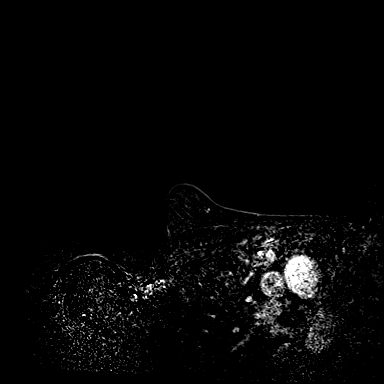
[im 144/144]
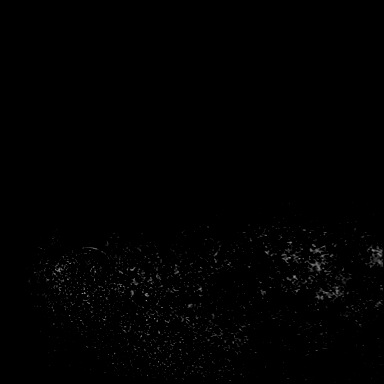

[Series 6: dynamic post 3 · axial · 1.3mm · 0.73mm/px · z∈[-92,-24]mm · 4 of 144 slices shown]
[im 1/144]
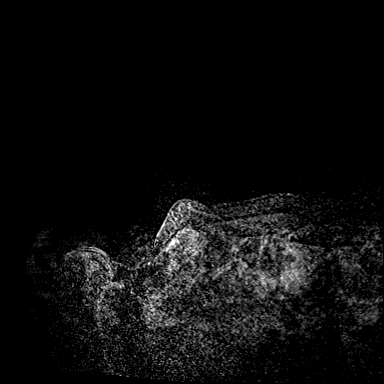
[im 18/144]
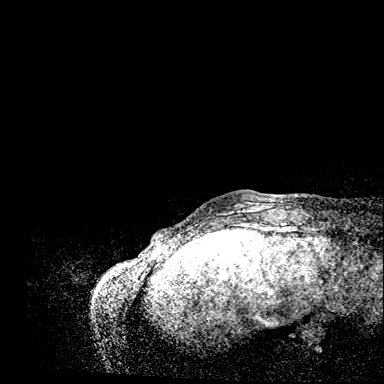
[im 36/144]
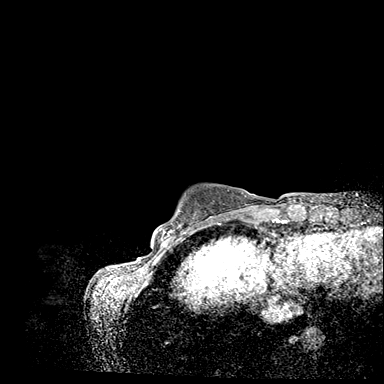
[im 54/144]
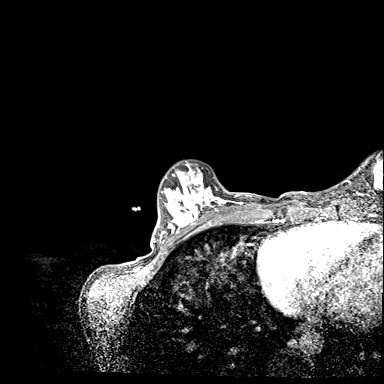

[34 of 48 positions shown; findings below may reference images not displayed]

Three-dimensional MR images were rendered by post-processing of the
original MR data on an independent workstation. The
three-dimensional MR images were interpreted, and findings are
reported in the following complete MRI report for this study. Three
dimensional images were evaluated at the independent [employer]
workstation
FINDINGS: Breast composition: c. Heterogeneous fibroglandular tissue.

Background parenchymal enhancement: Moderate.

Right breast: The background parenchymal enhancement pattern is
significantly decreased on today's MRI, and much less nodular today
compared to July 06, 2019. No focal mass is identified in the right
breast to suggest malignancy and there is no target for biopsy.

Lymph nodes: No abnormal appearing lymph nodes.

Ancillary findings:  None.
IMPRESSION: Decreased background parenchymal enhancement of the right breast
compared to recent prior on July 06, 2019 in this premenopausal
patient. There is no enhancing mass to target for biopsy. No
suspicious findings.

RECOMMENDATION:
Continued treatment planning for known left breast cancer. Patient
plans for bilateral mastectomies, and therefore no further imaging
follow-up is suggested unless new areas of concern arise.

BI-RADS CATEGORY  1: Negative.

## 2022-07-20 ENCOUNTER — Inpatient Hospital Stay: Payer: Commercial Managed Care - PPO | Attending: Hematology and Oncology | Admitting: Hematology and Oncology

## 2022-07-20 ENCOUNTER — Other Ambulatory Visit: Payer: Self-pay

## 2022-07-20 ENCOUNTER — Telehealth: Payer: Self-pay | Admitting: *Deleted

## 2022-07-20 VITALS — BP 128/69 | HR 105 | Temp 98.5°F | Resp 18 | Wt 147.0 lb

## 2022-07-20 DIAGNOSIS — Z9013 Acquired absence of bilateral breasts and nipples: Secondary | ICD-10-CM | POA: Diagnosis not present

## 2022-07-20 DIAGNOSIS — N6489 Other specified disorders of breast: Secondary | ICD-10-CM | POA: Insufficient documentation

## 2022-07-20 DIAGNOSIS — Z79899 Other long term (current) drug therapy: Secondary | ICD-10-CM | POA: Insufficient documentation

## 2022-07-20 DIAGNOSIS — Z171 Estrogen receptor negative status [ER-]: Secondary | ICD-10-CM

## 2022-07-20 DIAGNOSIS — Z820 Family history of epilepsy and other diseases of the nervous system: Secondary | ICD-10-CM | POA: Insufficient documentation

## 2022-07-20 DIAGNOSIS — Z803 Family history of malignant neoplasm of breast: Secondary | ICD-10-CM | POA: Insufficient documentation

## 2022-07-20 DIAGNOSIS — Z818 Family history of other mental and behavioral disorders: Secondary | ICD-10-CM | POA: Diagnosis not present

## 2022-07-20 DIAGNOSIS — G629 Polyneuropathy, unspecified: Secondary | ICD-10-CM | POA: Diagnosis not present

## 2022-07-20 DIAGNOSIS — Z9079 Acquired absence of other genital organ(s): Secondary | ICD-10-CM | POA: Insufficient documentation

## 2022-07-20 DIAGNOSIS — C50412 Malignant neoplasm of upper-outer quadrant of left female breast: Secondary | ICD-10-CM | POA: Insufficient documentation

## 2022-07-20 NOTE — Telephone Encounter (Signed)
Patient scheduled for a follow up appt on 7/11 with Dr Pricilla Holm

## 2022-07-20 NOTE — Progress Notes (Signed)
[facility]  Telephone:(336) [phone] Fax:([phone]) [phone]     ID: Joy Reyes DOB: 08-20-1985  MR#: [id number]  JYN#:[id number]  Patient Care Team: [facility] as PCP - General (Family Medicine) Pershing Proud, RN as Oncology Nurse Navigator Donnelly Angelica, RN as Oncology Nurse Navigator Glenna Fellows, MD as Consulting Physician (Plastic Surgery) Manus Rudd, MD as Consulting Physician (General Surgery) Lonie Peak, MD as Attending Physician (Radiation Oncology) Rachel Moulds, MD OTHER MD: PCP= [name] MD  CHIEF COMPLAINT: BRCA1 positive breast cancer  CURRENT TREATMENT: Observation  INTERVAL HISTORY:  Joy Reyes" returns today for follow up of her BRCA positive breast cancer.  She took olaparib for 1 day at full dose, 300 mg twice daily, and really could not tolerate it.  We discussed starting at a lower dose but she really preferred not to.  Accordingly we are continuing with observation alone. She continues surveillance with Dr Pricilla Holm, last [place] Aug 2023. CA 125 normal. She denies any complaints for me today except sore throat. She feels well on a daily basis. Energy is good.  She has been exercising regularly.  No change in breathing, bowel habits or urinary habits.  No new neurological complaints.   COVID 19 VACCINATION STATUS: Never had COVID.  Refuses vaccinations    HISTORY OF CURRENT ILLNESS: From the original intake note:  Joy Reyes has a history of breast cancer, diagnosed in 2012 while she lived in [place], [place], and treated in [place] under Dr Doyce Para.  We are obtaining data from that experience but according to the patient her tumor was the size of an apricot.  She did not have axillary lymph node sampling or radiation.  She remembers 8 neoadjuvant chemotherapy treatments given every 2 weeks with the first including the "red drug", which suggest Cytoxan and Adriamycin in dose dense fashion x4 most likely  followed by either Taxol or Taxotere in dose dense fashion x4.  After that experience and given her breast density and BRCA mutation our practice here would have been a yearly follow-up with MRI in addition to yearly mammography.   More recently Joy Reyes herself palpated a change in her right breast.  She brought it to Dr. Geronimo Boot attention and underwent bilateral diagnostic mammography with tomography and left breast ultrasonography at [facility] on 06/14/2019 showing: breast density category D; there was an area of asymmetry in the upper outer quadrant of the left breast, and ultrasound of that area showed a 2.8 cm mass.  Ultrasound of the axilla showed 1.8 cm lymph node with cortical thickening.    In the right breast they saw only postsurgical changes, with 2 biopsy clips in the retroareolar area.  Accordingly on 06/19/2019 she proceeded to biopsy of the left breast area in question. 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  The left axillary lymph node biopsy was nondiagnostic, with very spare sampling of fibroadipose and lymphoid tissue.  Estrogen receptor was negative, with less than 1% uptake, progesterone receptor was negative, with less than 1% uptake, HER-2 was negative by immunohistochemistry with a score of 1+.  The patient's subsequent history is as detailed below.   PAST MEDICAL HISTORY: Past Medical History:  Diagnosis Date   PONV (postoperative nausea and vomiting)     PAST SURGICAL HISTORY: Past Surgical History:  Procedure Laterality Date   BREAST RECONSTRUCTION WITH PLACEMENT OF TISSUE EXPANDER AND ALLODERM Bilateral 10/05/20221   BILATERAL  BREAST RECONSTRUCTION WITH PLACEMENT OF TISSUE EXPANDER AND ALLODERM (Bilateral Breast)   BREAST RECONSTRUCTION WITH PLACEMENT OF TISSUE EXPANDER AND ALLODERM Bilateral 11/27/2019   Procedure: BILATERAL BREAST RECONSTRUCTION WITH PLACEMENT OF TISSUE  EXPANDER AND ALLODERM;  Surgeon: Glenna Fellows, MD;  Location: [facility] OR;  Service: Plastics;  Laterality: Bilateral;   BREAST SURGERY     lumpectomy on right breast   CESAREAN SECTION MULTI-GESTATIONAL N/A 06/01/2015   Procedure: CESAREAN SECTION MULTI-GESTATIONAL;  Surgeon: Hoover Browns, MD;  Location: [facility];  Service: Obstetrics;  Laterality: N/A;   IR IMAGING GUIDED PORT INSERTION  07/05/2019   LOBECTOMY Right 2012   MASTECTOMY W/ SENTINEL NODE BIOPSY Bilateral 11/27/2019    BILATERAL MASTECTOMIES WITH LEFT AXILLARY SENTINEL LYMPH NODE BIOPSY (Bilateral Breast)   MASTECTOMY W/ SENTINEL NODE BIOPSY Bilateral 11/27/2019   Procedure: BILATERAL MASTECTOMIES WITH LEFT AXILLARY SENTINEL LYMPH NODE BIOPSY;  Surgeon: Manus Rudd, MD;  Location: [facility] OR;  Service: General;  Laterality: Bilateral;  PEC BLOCK, RNFA REQUESTED   PORT-A-CATH REMOVAL Right 11/27/2019   Procedure: REMOVAL PORT-A-CATH;  Surgeon: Manus Rudd, MD;  Location: [facility];  Service: General;  Laterality: Right;   REMOVAL OF TISSUE EXPANDER AND PLACEMENT OF IMPLANT Bilateral 03/11/2020   Procedure: REMOVAL OF TISSUE EXPANDER AND PLACEMENT OF SILICONE IMPLANT;  Surgeon: Glenna Fellows, MD;  Location: [facility];  Service: Plastics;  Laterality: Bilateral;   SALPINGECTOMY      FAMILY HISTORY: Family History  Problem Relation Age of Onset   Breast cancer Maternal Grandmother        very late in life, lived to 35   Breast cancer Paternal Grandmother        died at age 22   Alzheimer's disease Paternal Grandfather    Ovarian cancer Neg Hx    Colon cancer Neg Hx    Endometrial cancer Neg Hx    Pancreatic cancer Neg Hx    Prostate cancer Neg Hx    The patient's parents are both turning 66 in 2021.  The paternal grandfather died from Alzheimer's disease.  The paternal grandmother died from breast cancer at the age of 27.  The patient's father had 1 sister who had died as a child from meningitis.  He had no brothers.   On the mother side, a maternal grandmother did have breast cancer very late in life, living to be 100.  The paternal grandfather and the only maternal aunt had no history of cancer.  The patient has 1 brother, in good health, no sisters.   GYNECOLOGIC HISTORY:  No LMP recorded. (Menstrual status: Chemotherapy). Menarche: 37 years old Age at first live birth: 37 years old GX P twins LMP regular as of May 2021 Contraceptive HRT no  Hysterectomy?  No BSO?  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  Her husband Lynder Parents") works for an NGO (the [employer]) out of [place] which helps small agencies doing research set up and succeed.  He wrote a book, [employer], which sounds very interesting and he writes regularly for the [employer] as well.  Their twins are     years old. Alinda Money has (2?) children from a prior marriage. The patient belongs to an orthodox denomination    ADVANCED DIRECTIVES: In the absence of any documentation to the contrary, the patient's spouse is their HCPOA.    HEALTH MAINTENANCE: Social History   Tobacco Use  Smoking status: Never   Smokeless tobacco: Never  Vaping Use   Vaping Use: Never used  Substance Use Topics   Alcohol use: Not Currently    Alcohol/week: 4.0 standard drinks of alcohol    Types: 4 Glasses of wine per week   Drug use: No     Colonoscopy: n/a (age)  PAP: Up-to-date  Bone density: n/a (age)   Allergies  Allergen Reactions   Paclitaxel     Anaphylactic per pt history in MD note    Current Outpatient Medications  Medication Sig Dispense Refill   tretinoin (RETIN-A) 0.025 % cream Apply topically at bedtime.     No current facility-administered medications for this visit.    OBJECTIVE: White woman who appears well  Vitals:   07/20/22 1340  BP: 128/69  Pulse: (!) 105  Resp: 18  Temp: 98.5 F (36.9 C)     Body  mass index is 22.35 kg/m.   Wt Readings from Last 3 Encounters:  07/20/22 147 lb (66.7 kg)  08/28/21 144 lb 2 oz (65.4 kg)  07/17/21 145 lb 9.6 oz (66 kg)      ECOG FS:1 - Symptomatic but completely ambulatory  Sclerae unicteric, EOMs intact Wearing a mask No cervical or supraclavicular adenopathy Lungs no rales or rhonchi Heart regular rate and rhythm Abd soft, nontender, positive bowel sounds MSK no focal spinal tenderness, no upper extremity lymphedema Neuro: nonfocal, well oriented, appropriate affect Breasts: Status post bilateral mastectomies with implant reconstruction.  There is no evidence of recurrence or regional adenopathy.   LAB RESULTS:      Component Value Date/Time   NA 138 10/20/2021 1431   K 4.1 10/20/2021 1431   CL 105 10/20/2021 1431   CO2 27 10/20/2021 1431   GLUCOSE 97 10/20/2021 1431   BUN 9 10/20/2021 1431   CREATININE 0.67 10/20/2021 1431   CALCIUM 10.1 10/20/2021 1431   PROT 7.6 10/20/2021 1431   ALBUMIN 5.0 10/20/2021 1431   AST 17 10/20/2021 1431   ALT 16 10/20/2021 1431   ALKPHOS 35 (L) 10/20/2021 1431   BILITOT 0.6 10/20/2021 1431   GFRNONAA >60 10/20/2021 1431   GFRAA >60 11/19/2019 1340    No results found for: "TOTALPROTELP", "ALBUMINELP", "A1GS", "A2GS", "BETS", "BETA2SER", "GAMS", "MSPIKE", "SPEI"  Lab Results  Component Value Date   WBC 4.3 10/20/2021   NEUTROABS 2.3 10/20/2021   HGB 13.1 10/20/2021   HCT 37.1 10/20/2021   MCV 92.1 10/20/2021   PLT 232 10/20/2021    No results found for: "LABCA2"  No components found for: "ZOXWRU045"  No results for input(s): "INR" in the last 168 hours.  No results found for: "LABCA2"  No results found for: "CAN199"  Lab Results  Component Value Date   CAN125 25.7 10/20/2021    No results found for: "WUJ811"  No results found for: "CA2729"  No components found for: "HGQUANT"  No results found for: "CEA1", "CEA" / No results found for: "CEA1", "CEA"   No results found  for: "AFPTUMOR"  No results found for: "CHROMOGRNA"  No results found for: "KPAFRELGTCHN", "LAMBDASER", "KAPLAMBRATIO" (kappa/lambda light chains)  No results found for: "HGBA", "HGBA2QUANT", "HGBFQUANT", "HGBSQUAN" (Hemoglobinopathy evaluation)   No results found for: "LDH"  No results found for: "IRON", "TIBC", "IRONPCTSAT" (Iron and TIBC)  No results found for: "FERRITIN"  Urinalysis No results found for: "COLORURINE", "APPEARANCEUR", "LABSPEC", "PHURINE", "GLUCOSEU", "HGBUR", "BILIRUBINUR", "KETONESUR", "PROTEINUR", "UROBILINOGEN", "NITRITE", "LEUKOCYTESUR"   STUDIES: No results found.   ELIGIBLE FOR AVAILABLE RESEARCH  PROTOCOL: no  ASSESSMENT: 37 y.o. BRCA1 positive [facility] woman  (x) BRCA1 positivity:  (a) s/p bilateral salpingectomy 11/28/2013, age 63 (no oophorectomy) with benign pathology [phone], [address] [place], [place])  (b) pelvic ultrasound including transvaginal study 06/01/2019 unremarkable, as was CA 125  (c)  bilateral mastectomies 11/27/2019  (d) pt has frozen eggs at [facility]  (e) considering bilateral oophorectomy    RIGHT BREAST CANCER (1) Status post right breast and axillary node biopsy 2012 for a clinical T2 N0 invasive ductal carcinoma, grade 3, triple negative ([id number])  (a) received neoadjuvant chemotherapy consisting of 4 cycles of doxorubicin and cyclophosphamide, followed by paclitaxel x1 (developed anaphylaxis with the first dose), then 4 doses of docetaxel completed August 2012  (b) right lumpectomy 01/11/2011 with no sentinel lymph node sampling (refused by patient) 01/11/2011 showed no residual malignancy in the breast (ypT0 ypNX)  (c) no adjuvant radiation  LEFT BREAST CANCER (2) Status post left breast upper outer quadrant biopsy 06/19/2019 for a clinical T2 N1, stage IIIB invasive ductal carcinoma, grade 3, triple negative  (a) left axillary lymph node biopsy 07/12/2019 nondiagnostic [discordant]  (b) chest  abdomen and pelvis CT scan 07/05/2019 shows small left axillary lymph nodes, 1.5 cm enhancement in the left breast, no internal mammary adenopathy, no evidence of metastatic disease  (c) bone scan 07/13/2019 shows no evidence of bony metastatic disease  (d) biopsy of RIGHT breast mass 07/18/2019 canceled as bilateral mastectomies plan  (3) neoadjuvant chemotherapy consisting of carboplatin day 1, Abraxane days 1,8,15, repeated Q21 days x 4 cycles, starting 07/10/2019, last dose 09/25/2019  (a) final 2 doses of Abraxane omitted secondary to cytopenias and mild neuropathy  (4) status post bilateral skin sparing mastectomies and left axillary sentinel lymph node sampling 11/27/2019 with pathology showing  (a) on the right, no evidence of cancer  (b) on the left, no residual carcinoma, ypT0 ypN0  (c) a single left axillary lymph node was removed  (5) bilateral tissue expander placement 11/27/2019 with definitive silicone implant placement bilaterally 03/11/2020  (a) Complete incorporation ADM noted bilateral. Natrelle Inspira Full Projection 335 ml implants placed bilateral. REF SRF-335 RIGHT SN [id number] LEFT SN [id number]  (6) patient opted against adjuvant radiation  (7) unable to tolerate olaparib  (8) bilateral oophorectomy at age 2 planned   PLAN: Joy Reyes is now 12 years out from a right-sided breast cancer and 3 year out from her left-sided breast cancer with no evidence of disease recurrence.  She was recommended adjuvant olaparib, she tried it at full dose for 1 day, felt nauseous and had other GI side effects and decided not to take it. She denies any changes in her health concerning for breast cancer recurrence.  No concerns on physical examination today.  She would like to postpone bilateral salpingo-oophorectomy until about age 60 if possible.   We have discussed once again that there is no good screening for ovarian cancer and patients with early ovarian cancer do not tend to  manifest any symptoms.  She is overdue for for her gynecologic visit, encouraged her to call Dr. Pricilla Holm and schedule a follow-up.  She was previously getting transferred to having ultrasounds every 6 months and she is due for it.  She expressed understanding. She can continue to return to clinic once a year or sooner as needed.  Total encounter time 20 minutes.*  *Total Encounter Time as defined by the Centers for Medicare and Medicaid Services includes, in addition to the face-to-face time of  a patient visit (documented in the note above) non-face-to-face time: obtaining and reviewing outside history, ordering and reviewing medications, tests or procedures, care coordination (communications with other health care professionals or caregivers) and documentation in the medical record.

## 2022-07-21 ENCOUNTER — Encounter: Payer: Self-pay | Admitting: Oncology

## 2022-07-22 ENCOUNTER — Other Ambulatory Visit: Payer: Self-pay

## 2022-08-30 ENCOUNTER — Encounter: Payer: Self-pay | Admitting: Gynecologic Oncology

## 2022-09-02 ENCOUNTER — Inpatient Hospital Stay: Payer: Commercial Managed Care - PPO | Attending: Hematology and Oncology | Admitting: Gynecologic Oncology

## 2022-09-02 ENCOUNTER — Other Ambulatory Visit: Payer: Self-pay

## 2022-09-02 ENCOUNTER — Encounter: Payer: Self-pay | Admitting: Gynecologic Oncology

## 2022-09-02 ENCOUNTER — Inpatient Hospital Stay: Payer: Commercial Managed Care - PPO | Admitting: Gynecologic Oncology

## 2022-09-02 ENCOUNTER — Encounter: Payer: Self-pay | Admitting: Oncology

## 2022-09-02 VITALS — BP 130/69 | HR 68 | Temp 98.3°F | Resp 16 | Ht 68.0 in | Wt 150.0 lb

## 2022-09-02 DIAGNOSIS — Z9011 Acquired absence of right breast and nipple: Secondary | ICD-10-CM | POA: Diagnosis not present

## 2022-09-02 DIAGNOSIS — Z1501 Genetic susceptibility to malignant neoplasm of breast: Secondary | ICD-10-CM | POA: Diagnosis not present

## 2022-09-02 DIAGNOSIS — C50911 Malignant neoplasm of unspecified site of right female breast: Secondary | ICD-10-CM

## 2022-09-02 DIAGNOSIS — Z7189 Other specified counseling: Secondary | ICD-10-CM

## 2022-09-02 DIAGNOSIS — Z1509 Genetic susceptibility to other malignant neoplasm: Secondary | ICD-10-CM | POA: Diagnosis not present

## 2022-09-02 DIAGNOSIS — Z853 Personal history of malignant neoplasm of breast: Secondary | ICD-10-CM | POA: Insufficient documentation

## 2022-09-02 DIAGNOSIS — N83209 Unspecified ovarian cyst, unspecified side: Secondary | ICD-10-CM

## 2022-09-02 DIAGNOSIS — Z1502 Genetic susceptibility to malignant neoplasm of ovary: Secondary | ICD-10-CM | POA: Diagnosis not present

## 2022-09-02 DIAGNOSIS — Z9221 Personal history of antineoplastic chemotherapy: Secondary | ICD-10-CM | POA: Diagnosis not present

## 2022-09-02 DIAGNOSIS — Z148 Genetic carrier of other disease: Secondary | ICD-10-CM | POA: Insufficient documentation

## 2022-09-02 DIAGNOSIS — Z90722 Acquired absence of ovaries, bilateral: Secondary | ICD-10-CM | POA: Diagnosis not present

## 2022-09-02 NOTE — Progress Notes (Signed)
Gynecologic Oncology Return Clinic Visit  09/02/22  Reason for Visit: Follow-up in the setting of BRCA1 mutation   Treatment History: Oncology History  Malignant neoplasm of upper-outer quadrant of left breast in female, estrogen receptor negative (HCC)  06/26/2019 Initial Diagnosis   Malignant neoplasm of upper-outer quadrant of left breast in female, estrogen receptor negative (HCC)   07/10/2019 -  Chemotherapy   The patient had dexamethasone (DECADRON) 4 MG tablet, 8 mg, Oral, Daily, 1 of 1 cycle, Start date: 07/06/2019, End date: -- palonosetron (ALOXI) injection 0.25 mg, 0.25 mg, Intravenous,  Once, 4 of 4 cycles Administration: 0.25 mg (07/10/2019), 0.25 mg (08/07/2019), 0.25 mg (08/28/2019), 0.25 mg (09/25/2019) PACLitaxel-protein bound (ABRAXANE) chemo infusion 175 mg, 100 mg/m2 = 175 mg, Intravenous,  Once, 4 of 4 cycles Administration: 175 mg (07/10/2019), 175 mg (07/17/2019), 175 mg (07/24/2019), 175 mg (08/07/2019), 175 mg (08/14/2019), 175 mg (08/21/2019), 175 mg (08/28/2019), 175 mg (09/04/2019), 175 mg (09/11/2019), 175 mg (09/25/2019) CARBOplatin (PARAPLATIN) 590 mg in sodium chloride 0.9 % 250 mL chemo infusion, 590 mg (100 % of original dose 594 mg), Intravenous,  Once, 4 of 4 cycles Dose modification:   (original dose 594 mg, Cycle 1) Administration: 590 mg (07/10/2019), 630 mg (08/07/2019), 650 mg (08/28/2019), 650 mg (09/25/2019) fosaprepitant (EMEND) 150 mg in sodium chloride 0.9 % 145 mL IVPB, 150 mg, Intravenous,  Once, 4 of 4 cycles Administration: 150 mg (07/10/2019), 150 mg (08/07/2019), 150 mg (08/28/2019), 150 mg (09/25/2019)  for chemotherapy treatment.    Malignant neoplasm of upper-inner quadrant of right breast in female, estrogen receptor negative (HCC)  06/26/2019 Initial Diagnosis   Malignant neoplasm of upper-inner quadrant of right breast in female, estrogen receptor negative (HCC)   07/10/2019 -  Chemotherapy   The patient had dexamethasone (DECADRON) 4 MG tablet, 8 mg, Oral, Daily, 1  of 1 cycle, Start date: 07/06/2019, End date: -- palonosetron (ALOXI) injection 0.25 mg, 0.25 mg, Intravenous,  Once, 4 of 4 cycles Administration: 0.25 mg (07/10/2019), 0.25 mg (08/07/2019), 0.25 mg (08/28/2019), 0.25 mg (09/25/2019) PACLitaxel-protein bound (ABRAXANE) chemo infusion 175 mg, 100 mg/m2 = 175 mg, Intravenous,  Once, 4 of 4 cycles Administration: 175 mg (07/10/2019), 175 mg (07/17/2019), 175 mg (07/24/2019), 175 mg (08/07/2019), 175 mg (08/14/2019), 175 mg (08/21/2019), 175 mg (08/28/2019), 175 mg (09/04/2019), 175 mg (09/11/2019), 175 mg (09/25/2019) CARBOplatin (PARAPLATIN) 590 mg in sodium chloride 0.9 % 250 mL chemo infusion, 590 mg (100 % of original dose 594 mg), Intravenous,  Once, 4 of 4 cycles Dose modification:   (original dose 594 mg, Cycle 1) Administration: 590 mg (07/10/2019), 630 mg (08/07/2019), 650 mg (08/28/2019), 650 mg (09/25/2019) fosaprepitant (EMEND) 150 mg in sodium chloride 0.9 % 145 mL IVPB, 150 mg, Intravenous,  Once, 4 of 4 cycles Administration: 150 mg (07/10/2019), 150 mg (08/07/2019), 150 mg (08/28/2019), 150 mg (09/25/2019)  for chemotherapy treatment.    Invasive ductal carcinoma of breast, female, left (HCC)  11/27/2019 Initial Diagnosis   Invasive ductal carcinoma of breast, female, left (HCC)   01/22/2020 Cancer Staging   Staging form: Breast, AJCC 8th Edition - Pathologic stage from 01/22/2020: No Stage Recommended (ypT0, pN0, cM0, G3, ER-, PR-, HER2-) - Signed by Lonie Peak, MD on 01/23/2020    The patient's first diagnosis of triple negative breast cancer was in 2012. At that time it was treated with chemotherapy and surgery. Genetic testing determined that she was a carrier of BRCA 1 deleterious mutation. She then underwent laparoscopic salpingectomy in 2015 for risk reducing  purposes. Her treatment at that time took place in [place].   She underwent IVF and conceived a twin pregnancy which she delivered by cesarean section.   In April of 2021 she was diagnosed with a  new primary triple negative breast cancer which was treated with chemotherapy and surgery. She had risk reducing bilateral mastectomies with reconstruction. She completed chemotherapy in September, 2021.    She had a screening TVUS in April, 2021 which was normal.    Pelvic ultrasound 09/2020: Stable 2 cm simple cyst or dominant follicle in the right ovary. No follow up imaging recommended. Note: This recommendation does not apply to premenarchal patients or to those with increased risk (genetic, family history, elevated tumor markers or other high-risk factors) of ovarian cancer.   Pelvic ultrasound 03/2021: 1. Normal sonographic appearance of the uterus and left ovary. 2. Nonvisualization of the right ovary due to shadowing bowel gas.  Pelvic ultrasound 09/2021: Isoechoic structure within the left ovary which may represent a small corpus luteum cyst. Correlation with 6 week follow-up pelvic ultrasound is recommended to determine stability.  Pelvic ultrasound 12/09/21: Indeterminate mass in the left ovary appears increased in size since 10/20/2021. Recommend further evaluation with pelvic MRI with and without contrast.  MRI pelvis 11/2021: Normal appearance of left ovary. Resolution of lesion seen on recent ultrasound is consistent with a resolving corpus luteum. 2.6 cm benign appearing right corpus luteum cyst. No follow-up imaging recommended. Note: This recommendation does not apply to premenarchal patients and to those with increased risk (genetic, family history, elevated tumor markers or other high-risk factors) of ovarian cancer.   Interval History: Patient reports overall doing very well.  Since her last visit with me, she has had two 21-day cycles rather than her normal 28-day cycle.  Thinks this may have been related to stress.  She denies any intermenstrual bleeding.  She denies any pelvic or abdominal pain.  Endorses a good appetite with normal bowel bladder function.  Past  Medical/Surgical History: Past Medical History:  Diagnosis Date   PONV (postoperative nausea and vomiting)     Past Surgical History:  Procedure Laterality Date   BREAST RECONSTRUCTION WITH PLACEMENT OF TISSUE EXPANDER AND ALLODERM Bilateral 10/05/20221   BILATERAL BREAST RECONSTRUCTION WITH PLACEMENT OF TISSUE EXPANDER AND ALLODERM (Bilateral Breast)   BREAST RECONSTRUCTION WITH PLACEMENT OF TISSUE EXPANDER AND ALLODERM Bilateral 11/27/2019   Procedure: BILATERAL BREAST RECONSTRUCTION WITH PLACEMENT OF TISSUE EXPANDER AND ALLODERM;  Surgeon: Glenna Fellows, MD;  Location: [facility];  Service: Plastics;  Laterality: Bilateral;   BREAST SURGERY     lumpectomy on right breast   CESAREAN SECTION MULTI-GESTATIONAL N/A 06/01/2015   Procedure: CESAREAN SECTION MULTI-GESTATIONAL;  Surgeon: Hoover Browns, MD;  Location: [facility];  Service: Obstetrics;  Laterality: N/A;   IR IMAGING GUIDED PORT INSERTION  07/05/2019   LOBECTOMY Right 2012   MASTECTOMY W/ SENTINEL NODE BIOPSY Bilateral 11/27/2019    BILATERAL MASTECTOMIES WITH LEFT AXILLARY SENTINEL LYMPH NODE BIOPSY (Bilateral Breast)   MASTECTOMY W/ SENTINEL NODE BIOPSY Bilateral 11/27/2019   Procedure: BILATERAL MASTECTOMIES WITH LEFT AXILLARY SENTINEL LYMPH NODE BIOPSY;  Surgeon: Manus Rudd, MD;  Location: [facility];  Service: General;  Laterality: Bilateral;  PEC BLOCK, RNFA REQUESTED   PORT-A-CATH REMOVAL Right 11/27/2019   Procedure: REMOVAL PORT-A-CATH;  Surgeon: Manus Rudd, MD;  Location: [facility];  Service: General;  Laterality: Right;   REMOVAL OF TISSUE EXPANDER AND PLACEMENT OF IMPLANT Bilateral 03/11/2020   Procedure: REMOVAL OF TISSUE EXPANDER AND PLACEMENT OF  SILICONE IMPLANT;  Surgeon: Glenna Fellows, MD;  Location: [facility];  Service: Plastics;  Laterality: Bilateral;   SALPINGECTOMY      Family History  Problem Relation Age of Onset   Breast cancer Maternal Grandmother        very late in life, lived to 41   Breast  cancer Paternal Grandmother        died at age 71   Alzheimer's disease Paternal Grandfather    Ovarian cancer Neg Hx    Colon cancer Neg Hx    Endometrial cancer Neg Hx    Pancreatic cancer Neg Hx    Prostate cancer Neg Hx     Social History   Socioeconomic History   Marital status: Married    Spouse name: Not on file   Number of children: Not on file   Years of education: Not on file   Highest education level: Not on file  Occupational History   Not on file  Tobacco Use   Smoking status: Never   Smokeless tobacco: Never  Vaping Use   Vaping status: Never Used  Substance and Sexual Activity   Alcohol use: Not Currently    Alcohol/week: 4.0 standard drinks of alcohol    Types: 4 Glasses of wine per week   Drug use: No   Sexual activity: Yes    Birth control/protection: Surgical  Other Topics Concern   Not on file  Social History Narrative   Not on file   Social Determinants of Health   Financial Resource Strain: Not on file  Food Insecurity: Not on file  Transportation Needs: Not on file  Physical Activity: Not on file  Stress: Not on file  Social Connections: Unknown (06/22/2021)   [facility]   Social Network    Social Network: Not on file    Current Medications: No current outpatient medications on file.  Review of Systems: + menstrual change Denies appetite changes, fevers, chills, fatigue, unexplained weight changes. Denies hearing loss, neck lumps or masses, mouth sores, ringing in ears or voice changes. Denies cough or wheezing.  Denies shortness of breath. Denies chest pain or palpitations. Denies leg swelling. Denies abdominal distention, pain, blood in stools, constipation, diarrhea, nausea, vomiting, or early satiety. Denies pain with intercourse, dysuria, frequency, hematuria or incontinence. Denies hot flashes, pelvic pain, vaginal bleeding or vaginal discharge.   Denies joint pain, back pain or muscle pain/cramps. Denies  itching, rash, or wounds. Denies dizziness, headaches, numbness or seizures. Denies swollen lymph nodes or glands, denies easy bruising or bleeding. Denies anxiety, depression, confusion, or decreased concentration.  Physical Exam: BP 130/69 (BP Location: Left Arm, Patient Position: Sitting)   Pulse 68   Temp 98.3 F (36.8 C) (Oral)   Resp 16   Ht 5\' 8"  (1.727 m)   Wt 150 lb (68 kg)   SpO2 100%   BMI 22.81 kg/m  General: Alert, oriented, no acute distress. HEENT: Normocephalic, atraumatic, sclera anicteric. Chest: Clear to auscultation bilaterally.  No wheezes or rhonchi. Cardiovascular: Regular rate and rhythm, no murmurs. Abdomen: soft, nontender.  Normoactive bowel sounds.  No masses or hepatosplenomegaly appreciated.   Extremities: Grossly normal range of motion.  Warm, well perfused.  No edema bilaterally. Skin: No rashes or lesions noted. Lymphatics: No cervical, supraclavicular, or inguinal adenopathy. GU: Normal appearing external genitalia without erythema, excoriation, or lesions.  Speculum exam reveals well rugated vaginal mucosa, cervix normal appearing.  No lesions or masses.  Bimanual exam reveals small  mobile uterus, no adnexal masses, right ovary palpable.    Laboratory & Radiologic Studies:       Component Ref Range & Units 10 mo ago (10/20/21) 1 yr ago (04/20/21) 1 yr ago (01/12/21) 2 yr ago (03/05/20)  Cancer Antigen (CA) 125 0.0 - 38.1 U/mL 25.7 16.9 CM 9.8 CM 10.2 CM     Assessment & Plan: Joy Reyes is a 37 y.o. woman with a history of a deleterious mutation in BRCA 1, history of ER/PR/HER2 negative breast cancer. S/p prophylactic mastectomies. S/p prophylactic salpingectomy 2012.    Patient is doing well.  No masses appreciated on exam.  We will plan for CA125 today, ultrasound in August when she is back from her beach vacation.   We have previously discussed risk-reducing surgery in the setting of her BRCA1 mutation between the age of 56 and 9.   She has already undergone bilateral salpingectomy.  We discussed risk reducing surgery again today both in terms of her ovaries but also her uterus.  We have had multiple discussions about the age of timing for her surgery and lack of screening test for ovarian cancer.  We have also discussed that in the setting of bilateral mastectomy and her ER negative tumor, risk of new breast cancer is quite low and she would be a candidate for estrogen receptor therapy.  She would like to do some more research about this and is interested in cyclic estrogen therapy that would close her approximate her natural cycle.   We reviewed signs and symptoms that would be concerning for ovarian cancer, and I have encouraged her to call me if she were develop any of these.  Menstrual changes, likely secondary to life stressors.  Discussed reasons to call if she develops intermenstrual bleeding or multiple menses a month.  Her mother went through menopause at approximately 36 years of age.   I will plan to see the patient back in 6 months for follow-up.  22 minutes of total time was spent for this patient encounter, including preparation, face-to-face counseling with the patient and coordination of care, and documentation of the encounter.  Eugene Garnet, MD  Division of Gynecologic Oncology  Department of Obstetrics and Gynecology  [facility]

## 2022-09-02 NOTE — Patient Instructions (Signed)
It was good to see you today.  We will get you scheduled for a pelvic ultrasound when you are back in town in August.  I will let you know when your CA125 returns.  I will plan to see you in 6 months.  Please reach out if you want to have any discussion or have made decisions about surgery prior to that.

## 2022-09-04 LAB — CA 125: Cancer Antigen (CA) 125: 10.6 U/mL (ref 0.0–38.1)

## 2022-09-22 ENCOUNTER — Encounter: Payer: Self-pay | Admitting: Oncology

## 2022-09-29 ENCOUNTER — Ambulatory Visit (HOSPITAL_COMMUNITY)
Admission: RE | Admit: 2022-09-29 | Discharge: 2022-09-29 | Disposition: A | Discharge status: Home / Self Care | Payer: Commercial Managed Care - PPO | Source: Ambulatory Visit | Attending: Gynecologic Oncology | Admitting: Gynecologic Oncology

## 2022-09-29 DIAGNOSIS — C50911 Malignant neoplasm of unspecified site of right female breast: Secondary | ICD-10-CM | POA: Insufficient documentation

## 2022-09-29 DIAGNOSIS — Z1501 Genetic susceptibility to malignant neoplasm of breast: Secondary | ICD-10-CM | POA: Diagnosis present

## 2022-12-06 ENCOUNTER — Encounter: Payer: Self-pay | Admitting: Oncology

## 2022-12-06 NOTE — Telephone Encounter (Signed)
error 

## 2023-01-12 ENCOUNTER — Encounter: Payer: Self-pay | Admitting: Oncology

## 2023-01-12 NOTE — Telephone Encounter (Signed)
Telephone call  

## 2023-02-25 ENCOUNTER — Encounter: Payer: Self-pay | Admitting: Gynecologic Oncology

## 2023-02-25 ENCOUNTER — Inpatient Hospital Stay: Payer: Commercial Managed Care - PPO

## 2023-02-25 ENCOUNTER — Inpatient Hospital Stay: Payer: Commercial Managed Care - PPO | Attending: Gynecologic Oncology | Admitting: Gynecologic Oncology

## 2023-02-25 VITALS — BP 121/78 | HR 70 | Temp 98.3°F | Resp 20 | Wt 147.6 lb

## 2023-02-25 DIAGNOSIS — Z9079 Acquired absence of other genital organ(s): Secondary | ICD-10-CM | POA: Diagnosis not present

## 2023-02-25 DIAGNOSIS — C50911 Malignant neoplasm of unspecified site of right female breast: Secondary | ICD-10-CM

## 2023-02-25 DIAGNOSIS — Z1501 Genetic susceptibility to malignant neoplasm of breast: Secondary | ICD-10-CM | POA: Insufficient documentation

## 2023-02-25 DIAGNOSIS — Z1502 Genetic susceptibility to malignant neoplasm of ovary: Secondary | ICD-10-CM | POA: Diagnosis not present

## 2023-02-25 DIAGNOSIS — Z803 Family history of malignant neoplasm of breast: Secondary | ICD-10-CM | POA: Insufficient documentation

## 2023-02-25 DIAGNOSIS — C50211 Malignant neoplasm of upper-inner quadrant of right female breast: Secondary | ICD-10-CM | POA: Insufficient documentation

## 2023-02-25 DIAGNOSIS — Z148 Genetic carrier of other disease: Secondary | ICD-10-CM | POA: Insufficient documentation

## 2023-02-25 DIAGNOSIS — C50412 Malignant neoplasm of upper-outer quadrant of left female breast: Secondary | ICD-10-CM | POA: Diagnosis not present

## 2023-02-25 DIAGNOSIS — Z9013 Acquired absence of bilateral breasts and nipples: Secondary | ICD-10-CM | POA: Insufficient documentation

## 2023-02-25 DIAGNOSIS — Z1509 Genetic susceptibility to other malignant neoplasm: Secondary | ICD-10-CM | POA: Insufficient documentation

## 2023-02-25 NOTE — Patient Instructions (Addendum)
 It was good to see you today.  I do not see or feel anything abnormal in your exam.  Your CA125 will be back tomorrow.  I will let you know when I get your ultrasound results in February.  I will see you in 6 months.  Please call or message if you develop any new symptoms before then.

## 2023-02-25 NOTE — Progress Notes (Signed)
 Gynecologic Oncology Return Clinic Visit  02/25/23  Reason for Visit: Follow-up in the setting of BRCA1 mutation   Treatment History: Oncology History  Malignant neoplasm of upper-outer quadrant of left breast in female, estrogen receptor negative (HCC)  06/26/2019 Initial Diagnosis   Malignant neoplasm of upper-outer quadrant of left breast in female, estrogen receptor negative (HCC)   07/10/2019 -  Chemotherapy   The patient had dexamethasone  (DECADRON ) 4 MG tablet, 8 mg, Oral, Daily, 1 of 1 cycle, Start date: 07/06/2019, End date: -- palonosetron  (ALOXI ) injection 0.25 mg, 0.25 mg, Intravenous,  Once, 4 of 4 cycles Administration: 0.25 mg (07/10/2019), 0.25 mg (08/07/2019), 0.25 mg (08/28/2019), 0.25 mg (09/25/2019) PACLitaxel -protein bound (ABRAXANE ) chemo infusion 175 mg, 100 mg/m2 = 175 mg, Intravenous,  Once, 4 of 4 cycles Administration: 175 mg (07/10/2019), 175 mg (07/17/2019), 175 mg (07/24/2019), 175 mg (08/07/2019), 175 mg (08/14/2019), 175 mg (08/21/2019), 175 mg (08/28/2019), 175 mg (09/04/2019), 175 mg (09/11/2019), 175 mg (09/25/2019) CARBOplatin  (PARAPLATIN ) 590 mg in sodium chloride  0.9 % 250 mL chemo infusion, 590 mg (100 % of original dose 594 mg), Intravenous,  Once, 4 of 4 cycles Dose modification:   (original dose 594 mg, Cycle 1) Administration: 590 mg (07/10/2019), 630 mg (08/07/2019), 650 mg (08/28/2019), 650 mg (09/25/2019) fosaprepitant  (EMEND) 150 mg in sodium chloride  0.9 % 145 mL IVPB, 150 mg, Intravenous,  Once, 4 of 4 cycles Administration: 150 mg (07/10/2019), 150 mg (08/07/2019), 150 mg (08/28/2019), 150 mg (09/25/2019)  for chemotherapy treatment.    Malignant neoplasm of upper-inner quadrant of right breast in female, estrogen receptor negative (HCC)  06/26/2019 Initial Diagnosis   Malignant neoplasm of upper-inner quadrant of right breast in female, estrogen receptor negative (HCC)   07/10/2019 -  Chemotherapy   The patient had dexamethasone  (DECADRON ) 4 MG tablet, 8 mg, Oral, Daily, 1  of 1 cycle, Start date: 07/06/2019, End date: -- palonosetron  (ALOXI ) injection 0.25 mg, 0.25 mg, Intravenous,  Once, 4 of 4 cycles Administration: 0.25 mg (07/10/2019), 0.25 mg (08/07/2019), 0.25 mg (08/28/2019), 0.25 mg (09/25/2019) PACLitaxel -protein bound (ABRAXANE ) chemo infusion 175 mg, 100 mg/m2 = 175 mg, Intravenous,  Once, 4 of 4 cycles Administration: 175 mg (07/10/2019), 175 mg (07/17/2019), 175 mg (07/24/2019), 175 mg (08/07/2019), 175 mg (08/14/2019), 175 mg (08/21/2019), 175 mg (08/28/2019), 175 mg (09/04/2019), 175 mg (09/11/2019), 175 mg (09/25/2019) CARBOplatin  (PARAPLATIN ) 590 mg in sodium chloride  0.9 % 250 mL chemo infusion, 590 mg (100 % of original dose 594 mg), Intravenous,  Once, 4 of 4 cycles Dose modification:   (original dose 594 mg, Cycle 1) Administration: 590 mg (07/10/2019), 630 mg (08/07/2019), 650 mg (08/28/2019), 650 mg (09/25/2019) fosaprepitant  (EMEND) 150 mg in sodium chloride  0.9 % 145 mL IVPB, 150 mg, Intravenous,  Once, 4 of 4 cycles Administration: 150 mg (07/10/2019), 150 mg (08/07/2019), 150 mg (08/28/2019), 150 mg (09/25/2019)  for chemotherapy treatment.    Invasive ductal carcinoma of breast, female, left (HCC)  11/27/2019 Initial Diagnosis   Invasive ductal carcinoma of breast, female, left (HCC)   01/22/2020 Cancer Staging   Staging form: Breast, AJCC 8th Edition - Pathologic stage from 01/22/2020: No Stage Recommended (ypT0, pN0, cM0, G3, ER-, PR-, HER2-) - Signed by Izell Domino, MD on 01/23/2020    The patient's first diagnosis of triple negative breast cancer was in 2012. At that time it was treated with chemotherapy and surgery. Genetic testing determined that she was a carrier of BRCA 1 deleterious mutation. She then underwent laparoscopic salpingectomy in 2015 for risk reducing  purposes. Her treatment at that time took place in [place].   She underwent IVF and conceived a twin pregnancy which she delivered by cesarean section.   In April of 2021 she was diagnosed with a  new primary triple negative breast cancer which was treated with chemotherapy and surgery. She had risk reducing bilateral mastectomies with reconstruction. She completed chemotherapy in September, 2021.    She had a screening TVUS in April, 2021 which was normal.    Pelvic ultrasound 09/2020: Stable 2 cm simple cyst or dominant follicle in the right ovary. No follow up imaging recommended. Note: This recommendation does not apply to premenarchal patients or to those with increased risk (genetic, family history, elevated tumor markers or other high-risk factors) of ovarian cancer.    Pelvic ultrasound 03/2021: 1. Normal sonographic appearance of the uterus and left ovary. 2. Nonvisualization of the right ovary due to shadowing bowel gas.   Pelvic ultrasound 09/2021: Isoechoic structure within the left ovary which may represent a small corpus luteum cyst. Correlation with 6 week follow-up pelvic ultrasound is recommended to determine stability.   Pelvic ultrasound 12/09/21: Indeterminate mass in the left ovary appears increased in size since 10/20/2021. Recommend further evaluation with pelvic MRI with and without contrast.   MRI pelvis 11/2021: Normal appearance of left ovary. Resolution of lesion seen on recent ultrasound is consistent with a resolving corpus luteum. 2.6 cm benign appearing right corpus luteum cyst. No follow-up imaging recommended. Note: This recommendation does not apply to premenarchal patients and to those with increased risk (genetic, family history, elevated tumor markers or other high-risk factors) of ovarian cancer.   Pelvic ultrasound 09/29/2022: Waxing and waning bilateral ovarian cystic changes consistent with normal follicular activity. No acute pelvic pathology.  Interval History: Doing well.  Denies any abdominal or pelvic pain.  Reports regular monthly menses, denies intermenstrual bleeding.  Endorses normal bowel and bladder function.  Past Medical/Surgical  History: Past Medical History:  Diagnosis Date   PONV (postoperative nausea and vomiting)     Past Surgical History:  Procedure Laterality Date   BREAST RECONSTRUCTION WITH PLACEMENT OF TISSUE EXPANDER AND ALLODERM Bilateral 10/05/20221   BILATERAL BREAST RECONSTRUCTION WITH PLACEMENT OF TISSUE EXPANDER AND ALLODERM (Bilateral Breast)   BREAST RECONSTRUCTION WITH PLACEMENT OF TISSUE EXPANDER AND ALLODERM Bilateral 11/27/2019   Procedure: BILATERAL BREAST RECONSTRUCTION WITH PLACEMENT OF TISSUE EXPANDER AND ALLODERM;  Surgeon: Arelia Filippo, MD;  Location: [facility];  Service: Plastics;  Laterality: Bilateral;   BREAST SURGERY     lumpectomy on right breast   CESAREAN SECTION MULTI-GESTATIONAL N/A 06/01/2015   Procedure: CESAREAN SECTION MULTI-GESTATIONAL;  Surgeon: Jerolyn Foil, MD;  Location: [facility];  Service: Obstetrics;  Laterality: N/A;   IR IMAGING GUIDED PORT INSERTION  07/05/2019   LOBECTOMY Right 2012   MASTECTOMY W/ SENTINEL NODE BIOPSY Bilateral 11/27/2019    BILATERAL MASTECTOMIES WITH LEFT AXILLARY SENTINEL LYMPH NODE BIOPSY (Bilateral Breast)   MASTECTOMY W/ SENTINEL NODE BIOPSY Bilateral 11/27/2019   Procedure: BILATERAL MASTECTOMIES WITH LEFT AXILLARY SENTINEL LYMPH NODE BIOPSY;  Surgeon: Belinda Cough, MD;  Location: [facility];  Service: General;  Laterality: Bilateral;  PEC BLOCK, RNFA REQUESTED   PORT-A-CATH REMOVAL Right 11/27/2019   Procedure: REMOVAL PORT-A-CATH;  Surgeon: Belinda Cough, MD;  Location: [facility];  Service: General;  Laterality: Right;   REMOVAL OF TISSUE EXPANDER AND PLACEMENT OF IMPLANT Bilateral 03/11/2020   Procedure: REMOVAL OF TISSUE EXPANDER AND PLACEMENT OF SILICONE IMPLANT;  Surgeon: Arelia Filippo, MD;  Location: [facility];  Service: Plastics;  Laterality: Bilateral;   SALPINGECTOMY      Family History  Problem Relation Age of Onset   Breast cancer Maternal Grandmother        very late in life, lived to 72   Breast cancer Paternal  Grandmother        died at age 44   Alzheimer's disease Paternal Grandfather    Ovarian cancer Neg Hx    Colon cancer Neg Hx    Endometrial cancer Neg Hx    Pancreatic cancer Neg Hx    Prostate cancer Neg Hx     Social History   Socioeconomic History   Marital status: Married    Spouse name: Not on file   Number of children: Not on file   Years of education: Not on file   Highest education level: Not on file  Occupational History   Not on file  Tobacco Use   Smoking status: Never   Smokeless tobacco: Never  Vaping Use   Vaping status: Never Used  Substance and Sexual Activity   Alcohol use: Not Currently    Alcohol/week: 4.0 standard drinks of alcohol    Types: 4 Glasses of wine per week   Drug use: No   Sexual activity: Yes    Birth control/protection: Surgical  Other Topics Concern   Not on file  Social History Narrative   Not on file   Social Drivers of Health   Financial Resource Strain: Not on file  Food Insecurity: Not on file  Transportation Needs: Not on file  Physical Activity: Not on file  Stress: Not on file  Social Connections: Unknown (06/22/2021)   Received from [facility]   Social Network    Social Network: Not on file    Current Medications: No current outpatient medications on file.  Review of Systems: Denies appetite changes, fevers, chills, fatigue, unexplained weight changes. Denies hearing loss, neck lumps or masses, mouth sores, ringing in ears or voice changes. Denies cough or wheezing.  Denies shortness of breath. Denies chest pain or palpitations. Denies leg swelling. Denies abdominal distention, pain, blood in stools, constipation, diarrhea, nausea, vomiting, or early satiety. Denies pain with intercourse, dysuria, frequency, hematuria or incontinence. Denies hot flashes, pelvic pain, vaginal bleeding or vaginal discharge.   Denies joint pain, back pain or muscle pain/cramps. Denies itching, rash, or  wounds. Denies dizziness, headaches, numbness or seizures. Denies swollen lymph nodes or glands, denies easy bruising or bleeding. Denies anxiety, depression, confusion, or decreased concentration.  Physical Exam: BP 121/78 (BP Location: Right Arm, Patient Position: Sitting)   Pulse 70   Temp 98.3 F (36.8 C) (Oral)   Resp 20   Wt 147 lb 9.6 oz (67 kg)   SpO2 100%   BMI 22.44 kg/m  General: Alert, oriented, no acute distress. HEENT: Normocephalic, atraumatic, sclera anicteric. Chest: Clear to auscultation bilaterally.  No wheezes or rhonchi. Cardiovascular: Regular rate and rhythm, no murmurs. Abdomen: soft, nontender.  Normoactive bowel sounds.  No masses or hepatosplenomegaly appreciated.   Extremities: Grossly normal range of motion.  Warm, well perfused.  No edema bilaterally. Skin: No rashes or lesions noted. Lymphatics: No cervical, supraclavicular, or inguinal adenopathy. GU: Normal appearing external genitalia without erythema, excoriation, or lesions.  Bimanual exam reveals small mobile uterus, no adnexal masses, right ovary palpable.    Laboratory & Radiologic Studies:        Component Ref Range & Units (hover) 5 mo ago (09/02/22)  1 yr ago (10/20/21) 1 yr ago (04/20/21) 2 yr ago (01/12/21) 2 yr ago (03/05/20)  Cancer Antigen (CA) 125 10.6 25.7 CM 16.9 CM 9.8 CM 10.2       Assessment & Plan: Joy Reyes is a 38 y.o. woman with a history of a deleterious mutation in BRCA 1, history of ER/PR/HER2 negative breast cancer. S/p prophylactic mastectomies. S/p prophylactic salpingectomy 2012.    Patient is doing well.  No masses appreciated on exam.  We will plan for CA125 today, ultrasound in February.   We have previously discussed risk-reducing surgery in the setting of her BRCA1 mutation between the age of 63 and 5.  She has already undergone bilateral salpingectomy.  We discussed risk reducing surgery again today both in terms of her ovaries but also her uterus.   We have had multiple discussions about the age of timing for her surgery and lack of screening test for ovarian cancer.  We have also discussed that in the setting of bilateral mastectomy and her ER negative tumor, risk of new breast cancer is quite low and she would be a candidate for estrogen receptor therapy.  Today, we discussed again considerations about removal of the uterus given her BRCA1 mutation.  If plan for uterine preservation, discussed again option of Mirena IUD insertion to help with hormone replacement therapy.  Discussed options of estrogen replacement therapy, OCP as hormone replacement therapy, and several other options.   We reviewed signs and symptoms that would be concerning for ovarian cancer, and I have encouraged her to call me if she were develop any of these.   I will plan to see the patient back in 6 months for follow-up.  22 minutes of total time was spent for this patient encounter, including preparation, face-to-face counseling with the patient and coordination of care, and documentation of the encounter.  Comer Dollar, MD  Division of Gynecologic Oncology  Department of Obstetrics and Gynecology  [facility]

## 2023-02-26 LAB — CA 125: Cancer Antigen (CA) 125: 9.1 U/mL (ref 0.0–38.1)

## 2023-02-27 ENCOUNTER — Other Ambulatory Visit: Payer: Self-pay

## 2023-03-28 ENCOUNTER — Ambulatory Visit (HOSPITAL_COMMUNITY): Admission: RE | Admit: 2023-03-28 | Payer: Commercial Managed Care - PPO | Source: Ambulatory Visit

## 2023-04-03 ENCOUNTER — Other Ambulatory Visit: Payer: Self-pay

## 2023-04-15 ENCOUNTER — Ambulatory Visit (HOSPITAL_COMMUNITY)
Admission: RE | Admit: 2023-04-15 | Discharge: 2023-04-15 | Disposition: A | Discharge status: Home / Self Care | Payer: Commercial Managed Care - PPO | Source: Ambulatory Visit | Attending: Gynecologic Oncology | Admitting: Gynecologic Oncology

## 2023-04-15 DIAGNOSIS — C50911 Malignant neoplasm of unspecified site of right female breast: Secondary | ICD-10-CM | POA: Diagnosis present

## 2023-04-15 DIAGNOSIS — Z1501 Genetic susceptibility to malignant neoplasm of breast: Secondary | ICD-10-CM | POA: Diagnosis present

## 2023-05-13 ENCOUNTER — Encounter: Payer: Self-pay | Admitting: Gynecologic Oncology

## 2023-07-15 ENCOUNTER — Other Ambulatory Visit: Payer: Self-pay

## 2023-07-21 ENCOUNTER — Ambulatory Visit: Payer: Commercial Managed Care - PPO | Admitting: Hematology and Oncology

## 2023-07-28 ENCOUNTER — Other Ambulatory Visit: Payer: Self-pay | Admitting: Gynecologic Oncology

## 2023-07-28 ENCOUNTER — Encounter: Payer: Self-pay | Admitting: Gynecologic Oncology

## 2023-07-28 DIAGNOSIS — C50911 Malignant neoplasm of unspecified site of right female breast: Secondary | ICD-10-CM

## 2023-07-29 ENCOUNTER — Encounter: Payer: Self-pay | Admitting: Gynecologic Oncology

## 2023-07-29 ENCOUNTER — Inpatient Hospital Stay: Payer: Commercial Managed Care - PPO | Attending: Gynecologic Oncology | Admitting: Gynecologic Oncology

## 2023-07-29 ENCOUNTER — Inpatient Hospital Stay: Payer: Commercial Managed Care - PPO

## 2023-07-29 VITALS — BP 122/80 | HR 98 | Temp 77.0°F | Resp 20 | Wt 153.4 lb

## 2023-07-29 DIAGNOSIS — C50412 Malignant neoplasm of upper-outer quadrant of left female breast: Secondary | ICD-10-CM | POA: Insufficient documentation

## 2023-07-29 DIAGNOSIS — Z171 Estrogen receptor negative status [ER-]: Secondary | ICD-10-CM | POA: Diagnosis not present

## 2023-07-29 DIAGNOSIS — C50211 Malignant neoplasm of upper-inner quadrant of right female breast: Secondary | ICD-10-CM | POA: Insufficient documentation

## 2023-07-29 DIAGNOSIS — C50911 Malignant neoplasm of unspecified site of right female breast: Secondary | ICD-10-CM

## 2023-07-29 DIAGNOSIS — Z1501 Genetic susceptibility to malignant neoplasm of breast: Secondary | ICD-10-CM | POA: Insufficient documentation

## 2023-07-29 DIAGNOSIS — Z90722 Acquired absence of ovaries, bilateral: Secondary | ICD-10-CM | POA: Insufficient documentation

## 2023-07-29 DIAGNOSIS — Z9221 Personal history of antineoplastic chemotherapy: Secondary | ICD-10-CM | POA: Diagnosis not present

## 2023-07-29 DIAGNOSIS — Z1509 Genetic susceptibility to other malignant neoplasm: Secondary | ICD-10-CM | POA: Diagnosis not present

## 2023-07-29 DIAGNOSIS — Z9013 Acquired absence of bilateral breasts and nipples: Secondary | ICD-10-CM | POA: Insufficient documentation

## 2023-07-29 DIAGNOSIS — Z803 Family history of malignant neoplasm of breast: Secondary | ICD-10-CM | POA: Diagnosis not present

## 2023-07-29 DIAGNOSIS — Z1502 Genetic susceptibility to malignant neoplasm of ovary: Secondary | ICD-10-CM | POA: Insufficient documentation

## 2023-07-29 DIAGNOSIS — Z7189 Other specified counseling: Secondary | ICD-10-CM

## 2023-07-29 NOTE — Patient Instructions (Signed)
 It was good to see you today.  I will see you in 6 months for follow-up.  I will let you know when your CA125 is back and your pelvic ultrasound in August.  Please keep me posted if you have any changes to the way you are feeling including new abdominal or pelvic pain, bloating, unintentional weight loss, change to bowel or bladder function.

## 2023-07-29 NOTE — Progress Notes (Signed)
 Gynecologic Oncology Return Clinic Visit  07/29/23  Reason for Visit: Follow-up in the setting of BRCA1 mutation   Treatment History: Oncology History  Malignant neoplasm of upper-outer quadrant of left breast in female, estrogen receptor negative (HCC)  06/26/2019 Initial Diagnosis   Malignant neoplasm of upper-outer quadrant of left breast in female, estrogen receptor negative (HCC)   07/10/2019 -  Chemotherapy   The patient had dexamethasone  (DECADRON ) 4 MG tablet, 8 mg, Oral, Daily, 1 of 1 cycle, Start date: 07/06/2019, End date: -- palonosetron  (ALOXI ) injection 0.25 mg, 0.25 mg, Intravenous,  Once, 4 of 4 cycles Administration: 0.25 mg (07/10/2019), 0.25 mg (08/07/2019), 0.25 mg (08/28/2019), 0.25 mg (09/25/2019) PACLitaxel -protein bound (ABRAXANE ) chemo infusion 175 mg, 100 mg/m2 = 175 mg, Intravenous,  Once, 4 of 4 cycles Administration: 175 mg (07/10/2019), 175 mg (07/17/2019), 175 mg (07/24/2019), 175 mg (08/07/2019), 175 mg (08/14/2019), 175 mg (08/21/2019), 175 mg (08/28/2019), 175 mg (09/04/2019), 175 mg (09/11/2019), 175 mg (09/25/2019) CARBOplatin  (PARAPLATIN ) 590 mg in sodium chloride  0.9 % 250 mL chemo infusion, 590 mg (100 % of original dose 594 mg), Intravenous,  Once, 4 of 4 cycles Dose modification:   (original dose 594 mg, Cycle 1) Administration: 590 mg (07/10/2019), 630 mg (08/07/2019), 650 mg (08/28/2019), 650 mg (09/25/2019) fosaprepitant  (EMEND) 150 mg in sodium chloride  0.9 % 145 mL IVPB, 150 mg, Intravenous,  Once, 4 of 4 cycles Administration: 150 mg (07/10/2019), 150 mg (08/07/2019), 150 mg (08/28/2019), 150 mg (09/25/2019)  for chemotherapy treatment.    Malignant neoplasm of upper-inner quadrant of right breast in female, estrogen receptor negative (HCC)  06/26/2019 Initial Diagnosis   Malignant neoplasm of upper-inner quadrant of right breast in female, estrogen receptor negative (HCC)   07/10/2019 -  Chemotherapy   The patient had dexamethasone  (DECADRON ) 4 MG tablet, 8 mg, Oral, Daily, 1  of 1 cycle, Start date: 07/06/2019, End date: -- palonosetron  (ALOXI ) injection 0.25 mg, 0.25 mg, Intravenous,  Once, 4 of 4 cycles Administration: 0.25 mg (07/10/2019), 0.25 mg (08/07/2019), 0.25 mg (08/28/2019), 0.25 mg (09/25/2019) PACLitaxel -protein bound (ABRAXANE ) chemo infusion 175 mg, 100 mg/m2 = 175 mg, Intravenous,  Once, 4 of 4 cycles Administration: 175 mg (07/10/2019), 175 mg (07/17/2019), 175 mg (07/24/2019), 175 mg (08/07/2019), 175 mg (08/14/2019), 175 mg (08/21/2019), 175 mg (08/28/2019), 175 mg (09/04/2019), 175 mg (09/11/2019), 175 mg (09/25/2019) CARBOplatin  (PARAPLATIN ) 590 mg in sodium chloride  0.9 % 250 mL chemo infusion, 590 mg (100 % of original dose 594 mg), Intravenous,  Once, 4 of 4 cycles Dose modification:   (original dose 594 mg, Cycle 1) Administration: 590 mg (07/10/2019), 630 mg (08/07/2019), 650 mg (08/28/2019), 650 mg (09/25/2019) fosaprepitant  (EMEND) 150 mg in sodium chloride  0.9 % 145 mL IVPB, 150 mg, Intravenous,  Once, 4 of 4 cycles Administration: 150 mg (07/10/2019), 150 mg (08/07/2019), 150 mg (08/28/2019), 150 mg (09/25/2019)  for chemotherapy treatment.    Invasive ductal carcinoma of breast, female, left (HCC)  11/27/2019 Initial Diagnosis   Invasive ductal carcinoma of breast, female, left (HCC)   01/22/2020 Cancer Staging   Staging form: Breast, AJCC 8th Edition - Pathologic stage from 01/22/2020: No Stage Recommended (ypT0, pN0, cM0, G3, ER-, PR-, HER2-) - Signed by Colie Dawes, MD on 01/23/2020    The patient's first diagnosis of triple negative breast cancer was in 2012. At that time it was treated with chemotherapy and surgery. Genetic testing determined that she was a carrier of BRCA 1 deleterious mutation. She then underwent laparoscopic salpingectomy in 2015 for risk reducing  purposes. Her treatment at that time took place in [place].   She underwent IVF and conceived a twin pregnancy which she delivered by cesarean section.   In April of 2021 she was diagnosed with a  new primary triple negative breast cancer which was treated with chemotherapy and surgery. She had risk reducing bilateral mastectomies with reconstruction. She completed chemotherapy in September, 2021.    She had a screening TVUS in April, 2021 which was normal.    Pelvic ultrasound 09/2020: Stable 2 cm simple cyst or dominant follicle in the right ovary. No follow up imaging recommended. Note: This recommendation does not apply to premenarchal patients or to those with increased risk (genetic, family history, elevated tumor markers or other high-risk factors) of ovarian cancer.    Pelvic ultrasound 03/2021: 1. Normal sonographic appearance of the uterus and left ovary. 2. Nonvisualization of the right ovary due to shadowing bowel gas.   Pelvic ultrasound 09/2021: Isoechoic structure within the left ovary which may represent a small corpus luteum cyst. Correlation with 6 week follow-up pelvic ultrasound is recommended to determine stability.   Pelvic ultrasound 12/09/21: Indeterminate mass in the left ovary appears increased in size since 10/20/2021. Recommend further evaluation with pelvic MRI with and without contrast.   MRI pelvis 11/2021: Normal appearance of left ovary. Resolution of lesion seen on recent ultrasound is consistent with a resolving corpus luteum. 2.6 cm benign appearing right corpus luteum cyst. No follow-up imaging recommended. Note: This recommendation does not apply to premenarchal patients and to those with increased risk (genetic, family history, elevated tumor markers or other high-risk factors) of ovarian cancer.    Pelvic ultrasound 09/29/2022: Waxing and waning bilateral ovarian cystic changes consistent with normal follicular activity. No acute pelvic pathology.  Interval History: Patient is doing well.  Denies any significant changes since her last visit.  Denies abdominal or pelvic pain.  Past Medical/Surgical History: Past Medical History:  Diagnosis Date    PONV (postoperative nausea and vomiting)     Past Surgical History:  Procedure Laterality Date   BREAST RECONSTRUCTION WITH PLACEMENT OF TISSUE EXPANDER AND ALLODERM Bilateral 10/05/20221   BILATERAL BREAST RECONSTRUCTION WITH PLACEMENT OF TISSUE EXPANDER AND ALLODERM (Bilateral Breast)   BREAST RECONSTRUCTION WITH PLACEMENT OF TISSUE EXPANDER AND ALLODERM Bilateral 11/27/2019   Procedure: BILATERAL BREAST RECONSTRUCTION WITH PLACEMENT OF TISSUE EXPANDER AND ALLODERM;  Surgeon: Alger Infield, MD;  Location: [facility] OR;  Service: Plastics;  Laterality: Bilateral;   BREAST SURGERY     lumpectomy on right breast   CESAREAN SECTION MULTI-GESTATIONAL N/A 06/01/2015   Procedure: CESAREAN SECTION MULTI-GESTATIONAL;  Surgeon: Vernal Gold, MD;  Location: [facility];  Service: Obstetrics;  Laterality: N/A;   IR IMAGING GUIDED PORT INSERTION  07/05/2019   LOBECTOMY Right 2012   MASTECTOMY W/ SENTINEL NODE BIOPSY Bilateral 11/27/2019    BILATERAL MASTECTOMIES WITH LEFT AXILLARY SENTINEL LYMPH NODE BIOPSY (Bilateral Breast)   MASTECTOMY W/ SENTINEL NODE BIOPSY Bilateral 11/27/2019   Procedure: BILATERAL MASTECTOMIES WITH LEFT AXILLARY SENTINEL LYMPH NODE BIOPSY;  Surgeon: Dareen Ebbing, MD;  Location: [facility];  Service: General;  Laterality: Bilateral;  PEC BLOCK, RNFA REQUESTED   PORT-A-CATH REMOVAL Right 11/27/2019   Procedure: REMOVAL PORT-A-CATH;  Surgeon: Dareen Ebbing, MD;  Location: [facility];  Service: General;  Laterality: Right;   REMOVAL OF TISSUE EXPANDER AND PLACEMENT OF IMPLANT Bilateral 03/11/2020   Procedure: REMOVAL OF TISSUE EXPANDER AND PLACEMENT OF SILICONE IMPLANT;  Surgeon: Alger Infield, MD;  Location: [facility];  Service: Government social research officer;  Laterality: Bilateral;   SALPINGECTOMY      Family History  Problem Relation Age of Onset   Breast cancer Maternal Grandmother        very late in life, lived to 66   Breast cancer Paternal Grandmother        died at age 46   Alzheimer's  disease Paternal Grandfather    Ovarian cancer Neg Hx    Colon cancer Neg Hx    Endometrial cancer Neg Hx    Pancreatic cancer Neg Hx    Prostate cancer Neg Hx     Social History   Socioeconomic History   Marital status: Married    Spouse name: Not on file   Number of children: Not on file   Years of education: Not on file   Highest education level: Not on file  Occupational History   Not on file  Tobacco Use   Smoking status: Never   Smokeless tobacco: Never  Vaping Use   Vaping status: Never Used  Substance and Sexual Activity   Alcohol use: Not Currently    Alcohol/week: 4.0 standard drinks of alcohol    Types: 4 Glasses of wine per week   Drug use: No   Sexual activity: Yes    Birth control/protection: Surgical  Other Topics Concern   Not on file  Social History Narrative   Not on file   Social Drivers of Health   Financial Resource Strain: Not on file  Food Insecurity: Not on file  Transportation Needs: Not on file  Physical Activity: Not on file  Stress: Not on file  Social Connections: Unknown (06/22/2021)   Received from [facility], [facility]   Social Network    Social Network: Not on file    Current Medications: No current outpatient medications on file.  Review of Systems: Denies appetite changes, fevers, chills, fatigue, unexplained weight changes. Denies hearing loss, neck lumps or masses, mouth sores, ringing in ears or voice changes. Denies cough or wheezing.  Denies shortness of breath. Denies chest pain or palpitations. Denies leg swelling. Denies abdominal distention, pain, blood in stools, constipation, diarrhea, nausea, vomiting, or early satiety. Denies pain with intercourse, dysuria, frequency, hematuria or incontinence. Denies hot flashes, pelvic pain, vaginal bleeding or vaginal discharge.   Denies joint pain, back pain or muscle pain/cramps. Denies itching, rash, or wounds. Denies dizziness, headaches, numbness or  seizures. Denies swollen lymph nodes or glands, denies easy bruising or bleeding. Denies anxiety, depression, confusion, or decreased concentration.  Physical Exam: BP 122/80   Pulse 98   Temp (!) 77 F (25 C)   Resp 20   Wt 153 lb 6.4 oz (69.6 kg)   SpO2 100%   BMI 23.32 kg/m  General: Alert, oriented, no acute distress. HEENT: Normocephalic, atraumatic, sclera anicteric. Chest: Clear to auscultation bilaterally.  No wheezes or rhonchi. Cardiovascular: Regular rate and rhythm, no murmurs. Abdomen: soft, nontender.  Normoactive bowel sounds.  No masses or hepatosplenomegaly appreciated.   Extremities: Grossly normal range of motion.  Warm, well perfused.  No edema bilaterally. Skin: No rashes or lesions noted. Lymphatics: No cervical, supraclavicular, or inguinal adenopathy. GU: Normal appearing external genitalia without erythema, excoriation, or lesions.  Bimanual exam reveals small mobile uterus, no adnexal masses, right ovary palpable.  Laboratory & Radiologic Studies: Pelvic ultrasound 03/2023: 1. The endometrium appears heterogeneous. No definite lesion identified.   2. Hemorrhagic follicle within the right ovary measuring 2.1 cm and a small amount of fluid  within the cul de sac suggests possible ruptured ovarian cyst.   Assessment & Plan: Joy Reyes is a 38 y.o. woman with a history of a deleterious mutation in BRCA 1, history of ER/PR/HER2 negative breast cancer. S/p prophylactic mastectomies. S/p prophylactic salpingectomy 2012.    Patient is doing well.  No masses appreciated on exam.  We will plan for CA125 today, ultrasound in August.   We have previously discussed risk-reducing surgery in the setting of her BRCA1 mutation between the age of 25 and 84.  She has already undergone bilateral salpingectomy.  We discussed risk reducing surgery again today both in terms of her ovaries but also her uterus.  We have had multiple discussions about the age of timing for  her surgery and lack of screening test for ovarian cancer.  We have also discussed that in the setting of bilateral mastectomy and her ER negative tumor, risk of new breast cancer is quite low and she would be a candidate for estrogen receptor therapy.  Today, we discussed again considerations about removal of the uterus given her BRCA1 mutation.  If plan for uterine preservation, discussed again option of Mirena IUD insertion to help with hormone replacement therapy.  Discussed options of estrogen replacement therapy, OCP as hormone replacement therapy, and several other options.   We reviewed signs and symptoms that would be concerning for ovarian cancer, and I have encouraged her to call me if she were develop any of these.   I will plan to see the patient back in 6 months for follow-up.  22 minutes of total time was spent for this patient encounter, including preparation, face-to-face counseling with the patient and coordination of care, and documentation of the encounter.  Wiley Hanger, MD  Division of Gynecologic Oncology  Department of Obstetrics and Gynecology  [facility]

## 2023-07-30 LAB — CA 125: Cancer Antigen (CA) 125: 10 U/mL (ref 0.0–38.1)

## 2023-07-31 ENCOUNTER — Other Ambulatory Visit: Payer: Self-pay

## 2023-08-01 ENCOUNTER — Ambulatory Visit: Payer: Self-pay | Admitting: Gynecologic Oncology

## 2023-08-02 ENCOUNTER — Telehealth: Payer: Self-pay

## 2023-08-02 NOTE — Telephone Encounter (Signed)
 Verbally confirmed appt for 6/11

## 2023-08-03 ENCOUNTER — Inpatient Hospital Stay (HOSPITAL_BASED_OUTPATIENT_CLINIC_OR_DEPARTMENT_OTHER): Admitting: Hematology and Oncology

## 2023-08-03 VITALS — BP 121/64 | HR 71 | Temp 98.6°F | Resp 17 | Wt 153.8 lb

## 2023-08-03 DIAGNOSIS — Z171 Estrogen receptor negative status [ER-]: Secondary | ICD-10-CM | POA: Diagnosis not present

## 2023-08-03 DIAGNOSIS — Z1501 Genetic susceptibility to malignant neoplasm of breast: Secondary | ICD-10-CM

## 2023-08-03 DIAGNOSIS — C50412 Malignant neoplasm of upper-outer quadrant of left female breast: Secondary | ICD-10-CM | POA: Diagnosis not present

## 2023-08-03 DIAGNOSIS — C50911 Malignant neoplasm of unspecified site of right female breast: Secondary | ICD-10-CM

## 2023-08-03 NOTE — Progress Notes (Signed)
 [facility]  [phone] [phone]     ID: Joy Reyes DOB: 1985-03-30  MR#: [id number]  JYN#:[id number]  Patient Care Team: [facility] as PCP - General (Family Medicine) Auther Bo, RN as Oncology Nurse Navigator Alane Hsu, RN as Oncology Nurse Navigator Alger Infield, MD as Consulting Physician (Plastic Surgery) Dareen Ebbing, MD as Consulting Physician (General Surgery) Colie Dawes, MD as Attending Physician (Radiation Oncology) Murleen Arms, MD OTHER MD: PCP= [name] MD  CHIEF COMPLAINT: BRCA1 positive breast cancer  CURRENT TREATMENT: Observation  INTERVAL HISTORY:  Joy Reyes returns today for follow up of her BRCA positive breast cancer.   Discussed the use of AI scribe software for clinical note transcription with the patient, who gave verbal consent to proceed.  History of Present Illness Joy Reyes is a 38 year old female with BRCA1 mutation who presents for follow-up. She is doing very well overall. She denies any new health issues.   She is planning to have her ovaries removed next year and expects to need three to four weeks of leave post-procedure. She has previously undergone removal of her fallopian tubes and intends to retain her uterus. She has experienced menopausal symptoms twice before due to Lupron treatment and chemotherapy.  Her medical history includes a BRCA1 mutation, which led to a bilateral mastectomy. Her family history is significant for her grandmother who died of breast cancer at age 79. Her mother experienced menopause at age 33, which she considers relevant to her own health.  She has six children, including eight-year-old twins and four older stepchildren. Her oldest stepson is graduating from high school. She recently received a promotion at work and is in a transition period before assuming full responsibilities.  She reports regular bowel  movements with no issues. She is up to date with her dermatology check-ups, with her last visit being last year.  Rest of the pertinent 10 point ROS reviewed and neg.   COVID 19 VACCINATION STATUS: Never had COVID.  Refuses vaccinations    HISTORY OF CURRENT ILLNESS: From the original intake note:  Joy Reyes has a history of breast cancer, diagnosed in 2012 while she lived in [place], [place] , and treated in [place] under Dr Theadora Fines.  We are obtaining data from that experience but according to the patient her tumor was the size of an apricot.  She did not have axillary lymph node sampling or radiation.  She remembers 8 neoadjuvant chemotherapy treatments given every 2 weeks with the first including the red drug, which suggest Cytoxan and Adriamycin in dose dense fashion x4 most likely followed by either Taxol  or Taxotere in dose dense fashion x4.  After that experience and given her breast density and BRCA mutation our practice here would have been a yearly follow-up with MRI in addition to yearly mammography.   More recently Joy Reyes herself palpated a change in her right breast.  She brought it to Dr. Antoinette Batman attention and underwent bilateral diagnostic mammography with tomography and left breast ultrasonography at [facility] on 06/14/2019 showing: breast density category D; there was an area of asymmetry in the upper outer quadrant of the left breast, and ultrasound of that area showed a 2.8 cm mass.  Ultrasound of the axilla showed 1.8 cm lymph node with cortical thickening.    In the right breast they saw only postsurgical changes, with 2 biopsy clips in the retroareolar area.  Accordingly on 06/19/2019 she proceeded to biopsy of the left breast  area in question. 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  The left axillary lymph node biopsy was nondiagnostic, with very spare sampling of fibroadipose  and lymphoid tissue.  Estrogen receptor was negative, with less than 1% uptake, progesterone receptor was negative, with less than 1% uptake, HER-2 was negative by immunohistochemistry with a score of 1+.  The patient's subsequent history is as detailed below.   PAST MEDICAL HISTORY: Past Medical History:  Diagnosis Date   PONV (postoperative nausea and vomiting)     PAST SURGICAL HISTORY: Past Surgical History:  Procedure Laterality Date   BREAST RECONSTRUCTION WITH PLACEMENT OF TISSUE EXPANDER AND ALLODERM Bilateral 10/05/20221   BILATERAL BREAST RECONSTRUCTION WITH PLACEMENT OF TISSUE EXPANDER AND ALLODERM (Bilateral Breast)   BREAST RECONSTRUCTION WITH PLACEMENT OF TISSUE EXPANDER AND ALLODERM Bilateral 11/27/2019   Procedure: BILATERAL BREAST RECONSTRUCTION WITH PLACEMENT OF TISSUE EXPANDER AND ALLODERM;  Surgeon: Alger Infield, MD;  Location: [facility] OR;  Service: Plastics;  Laterality: Bilateral;   BREAST SURGERY     lumpectomy on right breast   CESAREAN SECTION MULTI-GESTATIONAL N/A 06/01/2015   Procedure: CESAREAN SECTION MULTI-GESTATIONAL;  Surgeon: Vernal Gold, MD;  Location: [facility];  Service: Obstetrics;  Laterality: N/A;   IR IMAGING GUIDED PORT INSERTION  07/05/2019   LOBECTOMY Right 2012   MASTECTOMY W/ SENTINEL NODE BIOPSY Bilateral 11/27/2019    BILATERAL MASTECTOMIES WITH LEFT AXILLARY SENTINEL LYMPH NODE BIOPSY (Bilateral Breast)   MASTECTOMY W/ SENTINEL NODE BIOPSY Bilateral 11/27/2019   Procedure: BILATERAL MASTECTOMIES WITH LEFT AXILLARY SENTINEL LYMPH NODE BIOPSY;  Surgeon: Dareen Ebbing, MD;  Location: [facility] OR;  Service: General;  Laterality: Bilateral;  PEC BLOCK, RNFA REQUESTED   PORT-A-CATH REMOVAL Right 11/27/2019   Procedure: REMOVAL PORT-A-CATH;  Surgeon: Dareen Ebbing, MD;  Location: [facility] OR;  Service: General;  Laterality: Right;   REMOVAL OF TISSUE EXPANDER AND PLACEMENT OF IMPLANT Bilateral 03/11/2020   Procedure: REMOVAL OF TISSUE EXPANDER AND PLACEMENT OF  SILICONE IMPLANT;  Surgeon: Alger Infield, MD;  Location: [facility];  Service: Plastics;  Laterality: Bilateral;   SALPINGECTOMY      FAMILY HISTORY: Family History  Problem Relation Age of Onset   Breast cancer Maternal Grandmother        very late in life, lived to 85   Breast cancer Paternal Grandmother        died at age 45   Alzheimer's disease Paternal Grandfather    Ovarian cancer Neg Hx    Colon cancer Neg Hx    Endometrial cancer Neg Hx    Pancreatic cancer Neg Hx    Prostate cancer Neg Hx    The patient's parents are both turning 30 in 2021.  The paternal grandfather died from Alzheimer's disease.  The paternal grandmother died from breast cancer at the age of 17.  The patient's father had 1 sister who had died as a child from meningitis.  He had no brothers.  On the mother side, a maternal grandmother did have breast cancer very late in life, living to be 100.  The paternal grandfather and the only maternal aunt had no history of cancer.  The patient has 1 brother, in good health, no sisters.   GYNECOLOGIC HISTORY:  No LMP recorded. (Menstrual status: Chemotherapy). Menarche: 38 years old Age at first live birth: 38 years old GX P twins LMP regular as of May 2021 Contraceptive HRT no  Hysterectomy?  No  BSO?  Status post bilateral salpingectomy, no oophorectomy   SOCIAL HISTORY: (updated May 2021)  Avory Rahimi works as a Firefighter.  Her husband Arnetta Lank Baker Reyes) works for an NGO (the [employer]) out of [place] which helps small agencies doing research set up and succeed.  He wrote a book, [employer], which sounds very interesting and he writes regularly for the [employer] as well.  Their twins are     years old. Baker Reyes has (2?) children from a prior marriage. The patient belongs to an orthodox denomination    ADVANCED DIRECTIVES: In the absence of any documentation to the contrary, the patient's spouse is their HCPOA.     HEALTH MAINTENANCE: Social History   Tobacco Use   Smoking status: Never   Smokeless tobacco: Never  Vaping Use   Vaping status: Never Used  Substance Use Topics   Alcohol use: Not Currently    Alcohol/week: 4.0 standard drinks of alcohol    Types: 4 Glasses of wine per week   Drug use: No     Colonoscopy: n/a (age)  PAP: Up-to-date  Bone density: n/a (age)   Allergies  Allergen Reactions   Paclitaxel      Anaphylactic per pt history in MD note    No current outpatient medications on file.   No current facility-administered medications for this visit.    OBJECTIVE: White woman who appears well  There were no vitals filed for this visit.    There is no height or weight on file to calculate BMI.   Wt Readings from Last 3 Encounters:  07/29/23 153 lb 6.4 oz (69.6 kg)  02/25/23 147 lb 9.6 oz (67 kg)  09/02/22 150 lb (68 kg)      ECOG FS:1 - Symptomatic but completely ambulatory  Sclerae unicteric, EOMs intact Breasts: Status post bilateral mastectomies with implant reconstruction.  There is no evidence of recurrence or regional adenopathy.  No LE edema  LAB RESULTS:      Component Value Date/Time   NA 138 10/20/2021 1431   K 4.1 10/20/2021 1431   CL 105 10/20/2021 1431   CO2 27 10/20/2021 1431   GLUCOSE 97 10/20/2021 1431   BUN 9 10/20/2021 1431   CREATININE 0.67 10/20/2021 1431   CALCIUM  10.1 10/20/2021 1431   PROT 7.6 10/20/2021 1431   ALBUMIN 5.0 10/20/2021 1431   AST 17 10/20/2021 1431   ALT 16 10/20/2021 1431   ALKPHOS 35 (L) 10/20/2021 1431   BILITOT 0.6 10/20/2021 1431   GFRNONAA >60 10/20/2021 1431   GFRAA >60 11/19/2019 1340    No results found for: TOTALPROTELP, ALBUMINELP, A1GS, A2GS, BETS, BETA2SER, GAMS, MSPIKE, SPEI  Lab Results  Component Value Date   WBC 4.3 10/20/2021   NEUTROABS 2.3 10/20/2021   HGB 13.1 10/20/2021   HCT 37.1 10/20/2021   MCV 92.1 10/20/2021   PLT 232 10/20/2021    No results found  for: LABCA2  No components found for: [id number]  No results for input(s): INR in the last 168 hours.  No results found for: LABCA2  No results found for: CAN199  Lab Results  Component Value Date   CAN125 10.0 07/29/2023    No results found for: WUJ811  No results found for: CA2729  No components found for: HGQUANT  No results found for: CEA1, CEA / No results found for: CEA1, CEA   No results found for: AFPTUMOR  No results found for: CHROMOGRNA  No results found for: KPAFRELGTCHN, LAMBDASER,  KAPLAMBRATIO (kappa/lambda light chains)  No results found for: HGBA, HGBA2QUANT, HGBFQUANT, HGBSQUAN (Hemoglobinopathy evaluation)   No results found for: LDH  No results found for: IRON, TIBC, IRONPCTSAT (Iron and TIBC)  No results found for: FERRITIN  Urinalysis No results found for: COLORURINE, APPEARANCEUR, LABSPEC, PHURINE, GLUCOSEU, HGBUR, BILIRUBINUR, KETONESUR, PROTEINUR, UROBILINOGEN, NITRITE, LEUKOCYTESUR   STUDIES: No results found.   ELIGIBLE FOR AVAILABLE RESEARCH PROTOCOL: no  ASSESSMENT: 38 y.o. BRCA1 positive [facility] woman  (x) BRCA1 positivity:  (a) s/p bilateral salpingectomy 11/28/2013, age 11 (no oophorectomy) with benign pathology [phone], [address] [place], [place])  (b) pelvic ultrasound including transvaginal study 06/01/2019 unremarkable, as was CA 125  (c)  bilateral mastectomies 11/27/2019  (d) pt has frozen eggs at [facility]  (e) considering bilateral oophorectomy    RIGHT BREAST CANCER (1) Status post right breast and axillary node biopsy 2012 for a clinical T2 N0 invasive ductal carcinoma, grade 3, triple negative ([id number])  (a) received neoadjuvant chemotherapy consisting of 4 cycles of doxorubicin and cyclophosphamide, followed by paclitaxel  x1 (developed anaphylaxis with the first dose), then 4 doses of docetaxel completed August 2012  (b)  right lumpectomy 01/11/2011 with no sentinel lymph node sampling (refused by patient) 01/11/2011 showed no residual malignancy in the breast (ypT0 ypNX)  (c) no adjuvant radiation  LEFT BREAST CANCER (2) Status post left breast upper outer quadrant biopsy 06/19/2019 for a clinical T2 N1, stage IIIB invasive ductal carcinoma, grade 3, triple negative  (a) left axillary lymph node biopsy 07/12/2019 nondiagnostic [discordant]  (b) chest abdomen and pelvis CT scan 07/05/2019 shows small left axillary lymph nodes, 1.5 cm enhancement in the left breast, no internal mammary adenopathy, no evidence of metastatic disease  (c) bone scan 07/13/2019 shows no evidence of bony metastatic disease  (d) biopsy of RIGHT breast mass 07/18/2019 canceled as bilateral mastectomies plan  (3) neoadjuvant chemotherapy consisting of carboplatin  day 1, Abraxane  days 1,8,15, repeated Q21 days x 4 cycles, starting 07/10/2019, last dose 09/25/2019  (a) final 2 doses of Abraxane  omitted secondary to cytopenias and mild neuropathy  (4) status post bilateral skin sparing mastectomies and left axillary sentinel lymph node sampling 11/27/2019 with pathology showing  (a) on the right, no evidence of cancer  (b) on the left, no residual carcinoma, ypT0 ypN0  (c) a single left axillary lymph node was removed  (5) bilateral tissue expander placement 11/27/2019 with definitive silicone implant placement bilaterally 03/11/2020  (a) Complete incorporation ADM noted bilateral. Natrelle Inspira Full Projection 335 ml implants placed bilateral. REF SRF-335 RIGHT SN [id number] LEFT SN [id number]  (6) patient opted against adjuvant radiation  (7) unable to tolerate olaparib   (8) bilateral oophorectomy at age 10 planned   PLAN: Joy Reyes is now 12 years out from a right-sided breast cancer and 3 year out from her left-sided breast cancer with no evidence of disease recurrence.   Assessment and Plan Assessment & Plan Triple-negative  breast cancer Post-bilateral mastectomy with no abnormalities. Regular surveillance with Dr. Orvil Bland and us .  - Continue alternating follow-up visits with Dr. Orvil Bland and oncology. - Schedule breast ultrasound in August. - Ensure regular breast exams between visits.  BRCA1 gene mutation BRCA1 mutation with increased breast and ovarian cancer risk. Completed bilateral mastectomy and salpingectomy. Future oophorectomy planned. - Plan for oophorectomy next year. - Discuss potential hormone replacement therapy post-oophorectomy. Continue ovarian cancer screening with Dr Orvil Bland  Total encounter time 30 minutes.*  *Total Encounter Time as defined by the Centers for Medicare  and Medicaid Services includes, in addition to the face-to-face time of a patient visit (documented in the note above) non-face-to-face time: obtaining and reviewing outside history, ordering and reviewing medications, tests or procedures, care coordination (communications with other health care professionals or caregivers) and documentation in the medical record.

## 2023-09-26 ENCOUNTER — Ambulatory Visit (HOSPITAL_COMMUNITY)

## 2023-09-29 ENCOUNTER — Encounter: Payer: Self-pay | Admitting: Oncology

## 2023-09-29 ENCOUNTER — Ambulatory Visit (HOSPITAL_COMMUNITY)
Admission: RE | Admit: 2023-09-29 | Discharge: 2023-09-29 | Disposition: A | Discharge status: Home / Self Care | Source: Ambulatory Visit | Attending: Gynecologic Oncology | Admitting: Gynecologic Oncology

## 2023-09-29 DIAGNOSIS — C50911 Malignant neoplasm of unspecified site of right female breast: Secondary | ICD-10-CM | POA: Diagnosis present

## 2023-09-29 DIAGNOSIS — Z1501 Genetic susceptibility to malignant neoplasm of breast: Secondary | ICD-10-CM | POA: Diagnosis present

## 2023-10-07 ENCOUNTER — Ambulatory Visit: Payer: Self-pay | Admitting: Gynecologic Oncology

## 2023-10-07 ENCOUNTER — Encounter: Payer: Self-pay | Admitting: Gynecologic Oncology

## 2024-02-03 ENCOUNTER — Encounter: Payer: Self-pay | Admitting: Oncology

## 2024-02-09 ENCOUNTER — Other Ambulatory Visit: Payer: Self-pay | Admitting: Gynecologic Oncology

## 2024-02-09 DIAGNOSIS — Z1509 Genetic susceptibility to other malignant neoplasm: Secondary | ICD-10-CM

## 2024-02-09 DIAGNOSIS — Z7189 Other specified counseling: Secondary | ICD-10-CM

## 2024-02-10 ENCOUNTER — Encounter: Payer: Self-pay | Admitting: Gynecologic Oncology

## 2024-02-10 ENCOUNTER — Inpatient Hospital Stay

## 2024-02-10 ENCOUNTER — Inpatient Hospital Stay: Attending: Gynecologic Oncology | Admitting: Gynecologic Oncology

## 2024-02-10 VITALS — BP 125/83 | HR 69 | Temp 98.1°F | Resp 19 | Wt 158.8 lb

## 2024-02-10 DIAGNOSIS — Z90722 Acquired absence of ovaries, bilateral: Secondary | ICD-10-CM | POA: Diagnosis not present

## 2024-02-10 DIAGNOSIS — Z1502 Genetic susceptibility to malignant neoplasm of ovary: Secondary | ICD-10-CM | POA: Diagnosis not present

## 2024-02-10 DIAGNOSIS — Z1722 Progesterone receptor negative status: Secondary | ICD-10-CM | POA: Diagnosis not present

## 2024-02-10 DIAGNOSIS — Z1509 Genetic susceptibility to other malignant neoplasm: Secondary | ICD-10-CM

## 2024-02-10 DIAGNOSIS — Z15068 Genetic susceptibility to other malignant neoplasm of digestive system: Secondary | ICD-10-CM

## 2024-02-10 DIAGNOSIS — C50412 Malignant neoplasm of upper-outer quadrant of left female breast: Secondary | ICD-10-CM | POA: Insufficient documentation

## 2024-02-10 DIAGNOSIS — Z9013 Acquired absence of bilateral breasts and nipples: Secondary | ICD-10-CM | POA: Insufficient documentation

## 2024-02-10 DIAGNOSIS — Z1505 Genetic susceptibility to malignant neoplasm of fallopian tube(s): Secondary | ICD-10-CM

## 2024-02-10 DIAGNOSIS — Z171 Estrogen receptor negative status [ER-]: Secondary | ICD-10-CM | POA: Insufficient documentation

## 2024-02-10 DIAGNOSIS — Z1501 Genetic susceptibility to malignant neoplasm of breast: Secondary | ICD-10-CM | POA: Insufficient documentation

## 2024-02-10 DIAGNOSIS — Z15060 Genetic susceptibility to colorectal cancer: Secondary | ICD-10-CM | POA: Diagnosis not present

## 2024-02-10 DIAGNOSIS — Z1732 Human epidermal growth factor receptor 2 negative status: Secondary | ICD-10-CM | POA: Insufficient documentation

## 2024-02-10 NOTE — Progress Notes (Signed)
 Gynecologic Oncology Return Clinic Visit  02/10/2024  Reason for Visit: Follow-up in the setting of BRCA1 mutation   Treatment History: Oncology History  Malignant neoplasm of upper-outer quadrant of left breast in female, estrogen receptor negative (HCC)  06/26/2019 Initial Diagnosis   Malignant neoplasm of upper-outer quadrant of left breast in female, estrogen receptor negative (HCC)   07/10/2019 - 09/25/2019 Chemotherapy   Patient is on Treatment Plan : LUNG Abraxane  / Carboplatin  q21d x 1 cycle     Malignant neoplasm of upper-inner quadrant of right breast in female, estrogen receptor negative (HCC)  06/26/2019 Initial Diagnosis   Malignant neoplasm of upper-inner quadrant of right breast in female, estrogen receptor negative (HCC)   07/10/2019 - 09/25/2019 Chemotherapy   Patient is on Treatment Plan : LUNG Abraxane  / Carboplatin  q21d x 1 cycle     Invasive ductal carcinoma of breast, female, left (HCC)  11/27/2019 Initial Diagnosis   Invasive ductal carcinoma of breast, female, left (HCC)   01/22/2020 Cancer Staging   Staging form: Breast, AJCC 8th Edition - Pathologic stage from 01/22/2020: No Stage Recommended (ypT0, pN0, cM0, G3, ER-, PR-, HER2-) - Signed by Izell Domino, MD on 01/23/2020    The patient's first diagnosis of triple negative breast cancer was in 2012. At that time it was treated with chemotherapy and surgery. Genetic testing determined that she was a carrier of BRCA 1 deleterious mutation. She then underwent laparoscopic salpingectomy in 2015 for risk reducing purposes. Her treatment at that time took place in [place].   She underwent IVF and conceived a twin pregnancy which she delivered by cesarean section.   In April of 2021 she was diagnosed with a new primary triple negative breast cancer which was treated with chemotherapy and surgery. She had risk reducing bilateral mastectomies with reconstruction. She completed chemotherapy in September, 2021.    She had a  screening TVUS in April, 2021 which was normal.    Pelvic ultrasound 09/2020: Stable 2 cm simple cyst or dominant follicle in the right ovary. No follow up imaging recommended. Note: This recommendation does not apply to premenarchal patients or to those with increased risk (genetic, family history, elevated tumor markers or other high-risk factors) of ovarian cancer.    Pelvic ultrasound 03/2021: 1. Normal sonographic appearance of the uterus and left ovary. 2. Nonvisualization of the right ovary due to shadowing bowel gas.   Pelvic ultrasound 09/2021: Isoechoic structure within the left ovary which may represent a small corpus luteum cyst. Correlation with 6 week follow-up pelvic ultrasound is recommended to determine stability.   Pelvic ultrasound 12/09/21: Indeterminate mass in the left ovary appears increased in size since 10/20/2021. Recommend further evaluation with pelvic MRI with and without contrast.   MRI pelvis 11/2021: Normal appearance of left ovary. Resolution of lesion seen on recent ultrasound is consistent with a resolving corpus luteum. 2.6 cm benign appearing right corpus luteum cyst. No follow-up imaging recommended. Note: This recommendation does not apply to premenarchal patients and to those with increased risk (genetic, family history, elevated tumor markers or other high-risk factors) of ovarian cancer.    Pelvic ultrasound 09/29/2022: Waxing and waning bilateral ovarian cystic changes consistent with normal follicular activity. No acute pelvic pathology.  Pelvic ultrasound 03/2023: Endometrium appears heterogenous, no definitive lesion noted.  Hemorrhagic follicle within the right ovary measures 2.1 cm and small amount of fluid within the cul-de-sac suggest possible ruptured ovarian cyst.  Pelvic ultrasound 09/2023: No significant ultrasound abnormality.  Right ovarian cyst measures up  to 1.9 cm, almost certainly benign and functional in the reproductive age  setting.  Interval History: Doing well.  Denies any pelvic or abdominal pain.  She is continue to be regular, slightly lighter than they used to be.  Past Medical/Surgical History: Past Medical History:  Diagnosis Date   PONV (postoperative nausea and vomiting)     Past Surgical History:  Procedure Laterality Date   BREAST RECONSTRUCTION WITH PLACEMENT OF TISSUE EXPANDER AND ALLODERM Bilateral 10/05/20221   BILATERAL BREAST RECONSTRUCTION WITH PLACEMENT OF TISSUE EXPANDER AND ALLODERM (Bilateral Breast)   BREAST RECONSTRUCTION WITH PLACEMENT OF TISSUE EXPANDER AND ALLODERM Bilateral 11/27/2019   Procedure: BILATERAL BREAST RECONSTRUCTION WITH PLACEMENT OF TISSUE EXPANDER AND ALLODERM;  Surgeon: Arelia Filippo, MD;  Location: [facility];  Service: Plastics;  Laterality: Bilateral;   BREAST SURGERY     lumpectomy on right breast   CESAREAN SECTION MULTI-GESTATIONAL N/A 06/01/2015   Procedure: CESAREAN SECTION MULTI-GESTATIONAL;  Surgeon: Jerolyn Foil, MD;  Location: [facility];  Service: Obstetrics;  Laterality: N/A;   IR IMAGING GUIDED PORT INSERTION  07/05/2019   LOBECTOMY Right 2012   MASTECTOMY W/ SENTINEL NODE BIOPSY Bilateral 11/27/2019    BILATERAL MASTECTOMIES WITH LEFT AXILLARY SENTINEL LYMPH NODE BIOPSY (Bilateral Breast)   MASTECTOMY W/ SENTINEL NODE BIOPSY Bilateral 11/27/2019   Procedure: BILATERAL MASTECTOMIES WITH LEFT AXILLARY SENTINEL LYMPH NODE BIOPSY;  Surgeon: Belinda Cough, MD;  Location: [facility];  Service: General;  Laterality: Bilateral;  PEC BLOCK, RNFA REQUESTED   PORT-A-CATH REMOVAL Right 11/27/2019   Procedure: REMOVAL PORT-A-CATH;  Surgeon: Belinda Cough, MD;  Location: [facility];  Service: General;  Laterality: Right;   REMOVAL OF TISSUE EXPANDER AND PLACEMENT OF IMPLANT Bilateral 03/11/2020   Procedure: REMOVAL OF TISSUE EXPANDER AND PLACEMENT OF SILICONE IMPLANT;  Surgeon: Arelia Filippo, MD;  Location: [facility];  Service: Plastics;  Laterality: Bilateral;    SALPINGECTOMY      Family History  Problem Relation Age of Onset   Breast cancer Maternal Grandmother        very late in life, lived to 3   Breast cancer Paternal Grandmother        died at age 60   Alzheimer's disease Paternal Grandfather    Ovarian cancer Neg Hx    Colon cancer Neg Hx    Endometrial cancer Neg Hx    Pancreatic cancer Neg Hx    Prostate cancer Neg Hx     Social History   Socioeconomic History   Marital status: Married    Spouse name: Not on file   Number of children: Not on file   Years of education: Not on file   Highest education level: Not on file  Occupational History   Not on file  Tobacco Use   Smoking status: Never   Smokeless tobacco: Never  Vaping Use   Vaping status: Never Used  Substance and Sexual Activity   Alcohol use: Not Currently    Alcohol/week: 4.0 standard drinks of alcohol    Types: 4 Glasses of wine per week   Drug use: No   Sexual activity: Yes    Birth control/protection: Surgical  Other Topics Concern   Not on file  Social History Narrative   Not on file   Social Drivers of Health   Tobacco Use: Low Risk (01/04/2024)   Received from [facility]   Patient History    Smoking Tobacco Use: Never    Smokeless Tobacco Use: Never    Passive Exposure: Never  Financial Resource Strain: Not on file  Food Insecurity: Not on file  Transportation Needs: Not on file  Physical Activity: Not on file  Stress: Not on file  Social Connections: Unknown (06/22/2021)   [facility]   Social Network    Social Network: Not on file  Depression (PHQ2-9): Not on file  Alcohol Screen: Not on file  Housing: Not on file  Utilities: Not on file  Health Literacy: Not on file    Current Medications: Current Medications[1]  Review of Systems: Denies appetite changes, fevers, chills, fatigue, unexplained weight changes. Denies hearing loss, neck lumps or masses, mouth sores, ringing in ears or voice changes. Denies  cough or wheezing.  Denies shortness of breath. Denies chest pain or palpitations. Denies leg swelling. Denies abdominal distention, pain, blood in stools, constipation, diarrhea, nausea, vomiting, or early satiety. Denies pain with intercourse, dysuria, frequency, hematuria or incontinence. Denies hot flashes, pelvic pain, vaginal bleeding or vaginal discharge.   Denies joint pain, back pain or muscle pain/cramps. Denies itching, rash, or wounds. Denies dizziness, headaches, numbness or seizures. Denies swollen lymph nodes or glands, denies easy bruising or bleeding. Denies anxiety, depression, confusion, or decreased concentration.  Physical Exam: BP 125/83 (BP Location: Left Arm, Patient Position: Sitting)   Pulse 69   Temp 98.1 F (36.7 C) (Oral)   Resp 19   Wt 158 lb 12.8 oz (72 kg)   SpO2 100%   BMI 24.15 kg/m  General: Alert, oriented, no acute distress. HEENT: Normocephalic, atraumatic, sclera anicteric. Chest: Clear to auscultation bilaterally.  No wheezes or rhonchi. Cardiovascular: Regular rate and rhythm, no murmurs. Abdomen: soft, nontender.  Normoactive bowel sounds.  No masses or hepatosplenomegaly appreciated.   Extremities: Grossly normal range of motion.  Warm, well perfused.  No edema bilaterally. Skin: No rashes or lesions noted. Lymphatics: No cervical, supraclavicular, or inguinal adenopathy. GU: Normal appearing external genitalia without erythema, excoriation, or lesions.  Bimanual exam reveals small mobile uterus, no adnexal masses, right ovary palpable.  Laboratory & Radiologic Studies:          Component Ref Range & Units (hover) 6 mo ago (07/29/23) 11 mo ago (02/25/23) 1 yr ago (09/02/22) 2 yr ago (10/20/21) 2 yr ago (04/20/21) 3 yr ago (01/12/21) 3 yr ago (03/05/20)  Cancer Antigen (CA) 125 10.0 9.1 CM 10.6 CM 25.7 CM 16.9 CM 9.8 CM 10.2      Assessment & Plan: Joy Reyes is a 38 y.o. woman with a history of a deleterious mutation in BRCA 1,  history of ER/PR/HER2 negative breast cancer. S/p prophylactic mastectomies. S/p prophylactic salpingectomy 2012.    Patient is doing well.  No masses appreciated on exam.  We will plan for CA125 today, ultrasound in February.   We have previously discussed risk-reducing surgery in the setting of her BRCA1 mutation between the age of 22 and 19.  She has already undergone bilateral salpingectomy.  We discussed risk reducing surgery again today both in terms of her ovaries but also her uterus.  We have had multiple discussions about the age of timing for her surgery and lack of screening test for ovarian cancer.  We have also discussed that in the setting of bilateral mastectomy and her ER negative tumor, risk of new breast cancer is quite low and she would be a candidate for estrogen receptor therapy.    Today, we discussed again risk reducing surgery.  She would like to move forward with scheduling this right before school lets  out next summer.  Her husband was present for today's exam.  Discussed option of doing bilateral oophorectomy only or concurrent hysterectomy.  Reviewed small but increased risk of uterine serous cancer with BRCA1 mutation.  Also discussed difference in hormone replacement therapy if she keeps her uterus in situ versus undergoes hysterectomy.  She is still thinking about what she would like to do.  Surgery will be tentatively scheduled for May 20 and she will return to see me for preoperative visit several weeks before hand.   We reviewed signs and symptoms that would be concerning for ovarian cancer, and I have encouraged her to call me if she were develop any of these.   28 minutes of total time was spent for this patient encounter, including preparation, face-to-face counseling with the patient and coordination of care, and documentation of the encounter.  Comer Dollar, MD  Division of Gynecologic Oncology  Department of Obstetrics and Gynecology  [facility]      [1] No current outpatient medications on file.

## 2024-02-10 NOTE — Patient Instructions (Addendum)
 It was good to see you.  We will plan on follow-up ultrasound in February and tentative surgery on Jul 11, 2024.  Please let me know if you have any change in symptoms before then.  We will see you back in the office closer to the date for a preop appointment with Nunzio Banet NP to discuss the instructions for before and after surgery.  You may also receive a phone call from the hospital to arrange for a pre-op appointment there as well. Usually both appointments can be combined on the same day.

## 2024-02-11 LAB — CA 125: Cancer Antigen (CA) 125: 9.8 U/mL (ref 0.0–38.1)

## 2024-02-13 ENCOUNTER — Ambulatory Visit: Payer: Self-pay | Admitting: Gynecologic Oncology

## 2024-03-14 ENCOUNTER — Other Ambulatory Visit: Payer: Self-pay

## 2024-03-26 ENCOUNTER — Encounter: Payer: Self-pay | Admitting: Oncology

## 2024-04-09 ENCOUNTER — Ambulatory Visit (HOSPITAL_COMMUNITY)

## 2024-07-05 ENCOUNTER — Inpatient Hospital Stay: Admitting: Gynecologic Oncology

## 2024-08-03 ENCOUNTER — Inpatient Hospital Stay: Payer: Self-pay | Attending: Gynecologic Oncology | Admitting: Hematology and Oncology
